# Patient Record
Sex: Male | Born: 1970 | Race: Black or African American | Hispanic: No | Marital: Single | Smoking: Former smoker
Health system: Southern US, Community
[De-identification: ages and names within clinical notes are randomized; demographics above are authoritative.]

## PROBLEM LIST (undated history)

## (undated) DIAGNOSIS — F418 Other specified anxiety disorders: Secondary | ICD-10-CM

## (undated) DIAGNOSIS — C259 Malignant neoplasm of pancreas, unspecified: Secondary | ICD-10-CM

## (undated) DIAGNOSIS — C799 Secondary malignant neoplasm of unspecified site: Secondary | ICD-10-CM

## (undated) DIAGNOSIS — K469 Unspecified abdominal hernia without obstruction or gangrene: Secondary | ICD-10-CM

## (undated) HISTORY — PX: ABDOMINAL SURGERY: SHX537

---

## 2003-02-02 ENCOUNTER — Emergency Department (HOSPITAL_COMMUNITY): Admission: EM | Admit: 2003-02-02 | Discharge: 2003-02-02 | Payer: Self-pay | Admitting: Emergency Medicine

## 2003-02-02 ENCOUNTER — Encounter: Payer: Self-pay | Admitting: Emergency Medicine

## 2003-02-04 ENCOUNTER — Emergency Department (HOSPITAL_COMMUNITY): Admission: EM | Admit: 2003-02-04 | Discharge: 2003-02-04 | Payer: Self-pay | Admitting: Emergency Medicine

## 2003-02-17 ENCOUNTER — Emergency Department (HOSPITAL_COMMUNITY): Admission: EM | Admit: 2003-02-17 | Discharge: 2003-02-17 | Payer: Self-pay | Admitting: *Deleted

## 2003-02-17 ENCOUNTER — Encounter: Payer: Self-pay | Admitting: *Deleted

## 2003-02-26 ENCOUNTER — Emergency Department (HOSPITAL_COMMUNITY): Admission: EM | Admit: 2003-02-26 | Discharge: 2003-02-26 | Payer: Self-pay | Admitting: Emergency Medicine

## 2005-07-19 ENCOUNTER — Emergency Department (HOSPITAL_COMMUNITY): Admission: EM | Admit: 2005-07-19 | Discharge: 2005-07-19 | Payer: Self-pay | Admitting: Emergency Medicine

## 2005-10-20 ENCOUNTER — Inpatient Hospital Stay (HOSPITAL_COMMUNITY): Admission: EM | Admit: 2005-10-20 | Discharge: 2005-10-22 | Payer: Self-pay | Admitting: Emergency Medicine

## 2005-11-07 ENCOUNTER — Emergency Department (HOSPITAL_COMMUNITY): Admission: EM | Admit: 2005-11-07 | Discharge: 2005-11-07 | Payer: Self-pay | Admitting: Emergency Medicine

## 2006-01-13 ENCOUNTER — Emergency Department (HOSPITAL_COMMUNITY): Admission: EM | Admit: 2006-01-13 | Discharge: 2006-01-14 | Payer: Self-pay | Admitting: Emergency Medicine

## 2006-03-17 ENCOUNTER — Emergency Department (HOSPITAL_COMMUNITY): Admission: EM | Admit: 2006-03-17 | Discharge: 2006-03-17 | Payer: Self-pay | Admitting: Emergency Medicine

## 2008-01-07 ENCOUNTER — Emergency Department (HOSPITAL_COMMUNITY): Admission: EM | Admit: 2008-01-07 | Discharge: 2008-01-07 | Payer: Self-pay | Admitting: Family Medicine

## 2010-05-26 ENCOUNTER — Emergency Department (HOSPITAL_COMMUNITY)
Admission: EM | Admit: 2010-05-26 | Discharge: 2010-05-26 | Payer: Self-pay | Source: Home / Self Care | Admitting: Emergency Medicine
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
## 2012-03-06 ENCOUNTER — Emergency Department (HOSPITAL_COMMUNITY): Admission: EM | Admit: 2012-03-06 | Discharge: 2012-03-06 | Discharge status: Against Medical Advice | Payer: Self-pay

## 2012-03-08 ENCOUNTER — Encounter (HOSPITAL_COMMUNITY): Payer: Self-pay | Admitting: *Deleted

## 2012-03-08 ENCOUNTER — Emergency Department (HOSPITAL_COMMUNITY)
Admission: EM | Admit: 2012-03-08 | Discharge: 2012-03-08 | Disposition: A | Discharge status: Home / Self Care | Payer: No Typology Code available for payment source | Attending: Emergency Medicine | Admitting: Emergency Medicine

## 2012-03-08 DIAGNOSIS — Z043 Encounter for examination and observation following other accident: Secondary | ICD-10-CM | POA: Insufficient documentation

## 2012-03-08 DIAGNOSIS — F17200 Nicotine dependence, unspecified, uncomplicated: Secondary | ICD-10-CM | POA: Insufficient documentation

## 2012-03-08 DIAGNOSIS — M542 Cervicalgia: Secondary | ICD-10-CM | POA: Insufficient documentation

## 2012-03-08 MED ORDER — IBUPROFEN 600 MG PO TABS: 600.0000 mg | ORAL_TABLET | Freq: Four times a day (QID) | ORAL | Status: AC | PRN

## 2012-03-08 MED ORDER — CYCLOBENZAPRINE HCL 5 MG PO TABS: 5.0000 mg | ORAL_TABLET | Freq: Three times a day (TID) | ORAL | Status: AC | PRN
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
## 2015-10-28 ENCOUNTER — Encounter (HOSPITAL_COMMUNITY): Payer: Self-pay | Admitting: Nurse Practitioner

## 2015-10-28 ENCOUNTER — Emergency Department (HOSPITAL_COMMUNITY): Payer: Medicaid Other

## 2015-10-28 ENCOUNTER — Inpatient Hospital Stay (HOSPITAL_COMMUNITY)
Admission: EM | Admit: 2015-10-28 | Discharge: 2015-11-07 | DRG: 435 | Disposition: A | Discharge status: Home / Self Care | Payer: Medicaid Other | Attending: Family Medicine | Admitting: Family Medicine

## 2015-10-28 DIAGNOSIS — C787 Secondary malignant neoplasm of liver and intrahepatic bile duct: Secondary | ICD-10-CM | POA: Diagnosis present

## 2015-10-28 DIAGNOSIS — I824Z1 Acute embolism and thrombosis of unspecified deep veins of right distal lower extremity: Secondary | ICD-10-CM | POA: Diagnosis present

## 2015-10-28 DIAGNOSIS — F17210 Nicotine dependence, cigarettes, uncomplicated: Secondary | ICD-10-CM | POA: Diagnosis present

## 2015-10-28 DIAGNOSIS — R030 Elevated blood-pressure reading, without diagnosis of hypertension: Secondary | ICD-10-CM | POA: Diagnosis not present

## 2015-10-28 DIAGNOSIS — D638 Anemia in other chronic diseases classified elsewhere: Secondary | ICD-10-CM | POA: Diagnosis present

## 2015-10-28 DIAGNOSIS — R109 Unspecified abdominal pain: Secondary | ICD-10-CM | POA: Diagnosis present

## 2015-10-28 DIAGNOSIS — I2699 Other pulmonary embolism without acute cor pulmonale: Secondary | ICD-10-CM | POA: Diagnosis present

## 2015-10-28 DIAGNOSIS — I82409 Acute embolism and thrombosis of unspecified deep veins of unspecified lower extremity: Secondary | ICD-10-CM | POA: Diagnosis present

## 2015-10-28 DIAGNOSIS — I824Z2 Acute embolism and thrombosis of unspecified deep veins of left distal lower extremity: Secondary | ICD-10-CM | POA: Diagnosis present

## 2015-10-28 DIAGNOSIS — Z7189 Other specified counseling: Secondary | ICD-10-CM | POA: Insufficient documentation

## 2015-10-28 DIAGNOSIS — F419 Anxiety disorder, unspecified: Secondary | ICD-10-CM | POA: Diagnosis present

## 2015-10-28 DIAGNOSIS — IMO0001 Reserved for inherently not codable concepts without codable children: Secondary | ICD-10-CM

## 2015-10-28 DIAGNOSIS — G893 Neoplasm related pain (acute) (chronic): Secondary | ICD-10-CM | POA: Diagnosis present

## 2015-10-28 DIAGNOSIS — C799 Secondary malignant neoplasm of unspecified site: Secondary | ICD-10-CM

## 2015-10-28 DIAGNOSIS — R634 Abnormal weight loss: Secondary | ICD-10-CM | POA: Diagnosis present

## 2015-10-28 DIAGNOSIS — K5903 Drug induced constipation: Secondary | ICD-10-CM | POA: Insufficient documentation

## 2015-10-28 DIAGNOSIS — C259 Malignant neoplasm of pancreas, unspecified: Principal | ICD-10-CM | POA: Diagnosis present

## 2015-10-28 DIAGNOSIS — M25572 Pain in left ankle and joints of left foot: Secondary | ICD-10-CM | POA: Diagnosis not present

## 2015-10-28 DIAGNOSIS — K5900 Constipation, unspecified: Secondary | ICD-10-CM | POA: Diagnosis not present

## 2015-10-28 DIAGNOSIS — Z6827 Body mass index (BMI) 27.0-27.9, adult: Secondary | ICD-10-CM

## 2015-10-28 DIAGNOSIS — C7900 Secondary malignant neoplasm of unspecified kidney and renal pelvis: Secondary | ICD-10-CM | POA: Diagnosis present

## 2015-10-28 DIAGNOSIS — C7889 Secondary malignant neoplasm of other digestive organs: Secondary | ICD-10-CM | POA: Diagnosis present

## 2015-10-28 DIAGNOSIS — Z515 Encounter for palliative care: Secondary | ICD-10-CM | POA: Insufficient documentation

## 2015-10-28 DIAGNOSIS — I82442 Acute embolism and thrombosis of left tibial vein: Secondary | ICD-10-CM | POA: Diagnosis present

## 2015-10-28 DIAGNOSIS — E46 Unspecified protein-calorie malnutrition: Secondary | ICD-10-CM | POA: Diagnosis present

## 2015-10-28 DIAGNOSIS — T402X5A Adverse effect of other opioids, initial encounter: Secondary | ICD-10-CM

## 2015-10-28 HISTORY — DX: Secondary malignant neoplasm of unspecified site: C79.9

## 2015-10-28 HISTORY — DX: Unspecified abdominal hernia without obstruction or gangrene: K46.9

## 2015-10-28 LAB — URINALYSIS, ROUTINE W REFLEX MICROSCOPIC
GLUCOSE, UA: NEGATIVE mg/dL
KETONES UR: 15 mg/dL — AB
LEUKOCYTES UA: NEGATIVE
Nitrite: NEGATIVE
PROTEIN: 30 mg/dL — AB
Specific Gravity, Urine: 1.027 (ref 1.005–1.030)
pH: 6 (ref 5.0–8.0)

## 2015-10-28 LAB — BASIC METABOLIC PANEL
ANION GAP: 12 (ref 5–15)
BUN: 7 mg/dL (ref 6–20)
CO2: 23 mmol/L (ref 22–32)
Calcium: 9.5 mg/dL (ref 8.9–10.3)
Chloride: 102 mmol/L (ref 101–111)
Creatinine, Ser: 0.96 mg/dL (ref 0.61–1.24)
GFR calc Af Amer: >60 mL/min (ref >60)
GFR calc non Af Amer: >60 mL/min (ref >60)
GLUCOSE: 110 mg/dL — AB (ref 65–99)
POTASSIUM: 3.8 mmol/L (ref 3.5–5.1)
Sodium: 137 mmol/L (ref 135–145)

## 2015-10-28 LAB — CBC
HCT: 35.7 % — ABNORMAL LOW (ref 39.0–52.0)
HEMATOCRIT: 37.7 % — AB (ref 39.0–52.0)
HEMOGLOBIN: 12.4 g/dL — AB (ref 13.0–17.0)
HEMOGLOBIN: 11.8 g/dL — AB (ref 13.0–17.0)
MCH: 27.3 pg (ref 26.0–34.0)
MCH: 27.3 pg (ref 26.0–34.0)
MCHC: 32.9 g/dL (ref 30.0–36.0)
MCHC: 33.1 g/dL (ref 30.0–36.0)
MCV: 83 fL (ref 78.0–100.0)
MCV: 82.4 fL (ref 78.0–100.0)
PLATELETS: 202 10*3/uL (ref 150–400)
Platelets: 189 10*3/uL (ref 150–400)
RBC: 4.54 MIL/uL (ref 4.22–5.81)
RBC: 4.33 MIL/uL (ref 4.22–5.81)
RDW: 13.9 % (ref 11.5–15.5)
RDW: 13.8 % (ref 11.5–15.5)
WBC: 9.4 10*3/uL (ref 4.0–10.5)
WBC: 11 10*3/uL — AB (ref 4.0–10.5)

## 2015-10-28 LAB — COMPREHENSIVE METABOLIC PANEL
ALT: 36 U/L (ref 17–63)
ANION GAP: 13 (ref 5–15)
AST: 33 U/L (ref 15–41)
Albumin: 3.5 g/dL (ref 3.5–5.0)
Alkaline Phosphatase: 146 U/L — ABNORMAL HIGH (ref 38–126)
BUN: 9 mg/dL (ref 6–20)
CALCIUM: 10 mg/dL (ref 8.9–10.3)
CHLORIDE: 102 mmol/L (ref 101–111)
CO2: 22 mmol/L (ref 22–32)
CREATININE: 0.88 mg/dL (ref 0.61–1.24)
Glucose, Bld: 108 mg/dL — ABNORMAL HIGH (ref 65–99)
Potassium: 4 mmol/L (ref 3.5–5.1)
Sodium: 137 mmol/L (ref 135–145)
Total Bilirubin: 0.7 mg/dL (ref 0.3–1.2)
Total Protein: 7.6 g/dL (ref 6.5–8.1)

## 2015-10-28 LAB — URINE MICROSCOPIC-ADD ON

## 2015-10-28 LAB — LIPASE, BLOOD: LIPASE: 26 U/L (ref 11–51)

## 2015-10-28 MED ORDER — PANTOPRAZOLE SODIUM 40 MG IV SOLR
40.0000 mg | Freq: Once | INTRAVENOUS | Status: AC
  Administered 2015-10-28: 40 mg via INTRAVENOUS
  Filled 2015-10-28: qty 40

## 2015-10-28 MED ORDER — IOPAMIDOL (ISOVUE-300) INJECTION 61%
INTRAVENOUS | Status: AC
  Administered 2015-10-28: 100 mL
  Filled 2015-10-28: qty 100

## 2015-10-28 MED ORDER — MORPHINE SULFATE (PF) 4 MG/ML IV SOLN
4.0000 mg | Freq: Once | INTRAVENOUS | Status: AC
  Administered 2015-10-28: 4 mg via INTRAVENOUS
  Filled 2015-10-28: qty 1

## 2015-10-28 MED ORDER — METOCLOPRAMIDE HCL 5 MG/ML IJ SOLN
10.0000 mg | Freq: Once | INTRAMUSCULAR | Status: AC
  Administered 2015-10-28: 10 mg via INTRAVENOUS
  Filled 2015-10-28: qty 2

## 2015-10-28 MED ORDER — ONDANSETRON HCL 4 MG PO TABS: 4.0000 mg | ORAL_TABLET | Freq: Four times a day (QID) | ORAL | Status: DC | PRN

## 2015-10-28 MED ORDER — SODIUM CHLORIDE 0.9 % IV SOLN
INTRAVENOUS | Status: DC
  Administered 2015-10-28: 75 mL/h via INTRAVENOUS
  Administered 2015-10-29 – 2015-11-01 (×5): via INTRAVENOUS
  Administered 2015-11-01: 1000 mL via INTRAVENOUS
  Administered 2015-11-02: 14:00:00 via INTRAVENOUS

## 2015-10-28 MED ORDER — ONDANSETRON HCL 4 MG/2ML IJ SOLN
4.0000 mg | Freq: Four times a day (QID) | INTRAMUSCULAR | Status: DC | PRN
  Administered 2015-10-29: 4 mg via INTRAVENOUS
  Filled 2015-10-28 (×2): qty 2

## 2015-10-28 MED ORDER — ACETAMINOPHEN 325 MG PO TABS
650.0000 mg | ORAL_TABLET | Freq: Four times a day (QID) | ORAL | Status: DC | PRN
  Administered 2015-10-29 – 2015-10-30 (×2): 650 mg via ORAL
  Filled 2015-10-28 (×4): qty 2

## 2015-10-28 MED ORDER — GI COCKTAIL ~~LOC~~
30.0000 mL | Freq: Once | ORAL | Status: AC
  Administered 2015-10-28: 30 mL via ORAL
  Filled 2015-10-28: qty 30

## 2015-10-28 MED ORDER — BISACODYL 5 MG PO TBEC: 5.0000 mg | DELAYED_RELEASE_TABLET | Freq: Every day | ORAL | Status: DC | PRN

## 2015-10-28 MED ORDER — MORPHINE SULFATE (PF) 2 MG/ML IV SOLN
2.0000 mg | INTRAVENOUS | Status: DC | PRN
Start: 2015-10-28 — End: 2015-11-07
  Administered 2015-10-28 – 2015-11-04 (×3): 2 mg via INTRAVENOUS
  Filled 2015-10-28 (×5): qty 1

## 2015-10-28 MED ORDER — POLYETHYLENE GLYCOL 3350 17 G PO PACK
17.0000 g | PACK | Freq: Every day | ORAL | Status: DC | PRN
  Administered 2015-11-02 – 2015-11-03 (×2): 17 g via ORAL
  Filled 2015-10-28 (×2): qty 1

## 2015-10-28 MED ORDER — ACETAMINOPHEN 650 MG RE SUPP: 650.0000 mg | Freq: Four times a day (QID) | RECTAL | Status: DC | PRN

## 2015-10-28 MED ORDER — SODIUM CHLORIDE 0.9 % IV BOLUS (SEPSIS)
1000.0000 mL | Freq: Once | INTRAVENOUS | Status: AC
  Administered 2015-10-28: 1000 mL via INTRAVENOUS

## 2015-10-28 MED ORDER — HYDRALAZINE HCL 25 MG PO TABS: 25.0000 mg | ORAL_TABLET | Freq: Three times a day (TID) | ORAL | Status: DC | PRN

## 2015-10-28 MED ORDER — HYDROCODONE-ACETAMINOPHEN 5-325 MG PO TABS
1.0000 | ORAL_TABLET | ORAL | Status: DC | PRN
  Administered 2015-10-28 – 2015-10-29 (×5): 2 via ORAL
  Filled 2015-10-28 (×5): qty 2

## 2015-10-28 NOTE — ED Notes (Signed)
Pt oob to br with steady gait 

## 2015-10-28 NOTE — ED Notes (Signed)
Attempted to call report

## 2015-10-28 NOTE — ED Notes (Signed)
Return from ct.
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
## 2015-10-28 NOTE — Progress Notes (Signed)
   10/28/15 1506  Clinical Encounter Type  Visited With Patient and family together;Health care provider  Visit Type Initial;ED  Referral From Nurse  Spiritual Encounters  Spiritual Needs Emotional  Stress Factors  Patient Stress Factors Health changes;Major life changes  Family Stress Factors Major life changes;Health changes   Chaplain responded to a request from patient's RN to be with patient and family as they receive poor medical news of cancer. Chaplain was present and offered support. Chaplain services available as needed.   Jeri Lager, Chaplain 10/28/2015 3:07 PM

## 2015-10-28 NOTE — ED Notes (Signed)
Pt c/o 1 month history abd pain. He was diagnosed with a hernia and saw a GI doctor who told him that he does not feel his pain is related to the hernia. He describes the pain as constant. He has tried ibuprofen with minimal relief of pain. He states he can not sleep due to pain. He reports n/v, constipation. He denies fevers.

## 2015-10-28 NOTE — ED Notes (Signed)
Patient transported to CT 
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
## 2015-10-28 NOTE — ED Notes (Signed)
Pastoral care paged for pt and family

## 2015-10-29 ENCOUNTER — Observation Stay (HOSPITAL_COMMUNITY): Payer: Medicaid Other

## 2015-10-29 ENCOUNTER — Encounter (HOSPITAL_COMMUNITY): Payer: Self-pay | Admitting: Radiology

## 2015-10-29 DIAGNOSIS — Z515 Encounter for palliative care: Secondary | ICD-10-CM | POA: Insufficient documentation

## 2015-10-29 DIAGNOSIS — C259 Malignant neoplasm of pancreas, unspecified: Secondary | ICD-10-CM | POA: Diagnosis present

## 2015-10-29 DIAGNOSIS — Z7189 Other specified counseling: Secondary | ICD-10-CM | POA: Insufficient documentation

## 2015-10-29 DIAGNOSIS — C787 Secondary malignant neoplasm of liver and intrahepatic bile duct: Secondary | ICD-10-CM

## 2015-10-29 DIAGNOSIS — R1084 Generalized abdominal pain: Secondary | ICD-10-CM

## 2015-10-29 LAB — CBC
HCT: 34.8 % — ABNORMAL LOW (ref 39.0–52.0)
HEMOGLOBIN: 11.4 g/dL — AB (ref 13.0–17.0)
MCH: 27.4 pg (ref 26.0–34.0)
MCHC: 32.8 g/dL (ref 30.0–36.0)
MCV: 83.7 fL (ref 78.0–100.0)
Platelets: 182 10*3/uL (ref 150–400)
RBC: 4.16 MIL/uL — AB (ref 4.22–5.81)
RDW: 14.1 % (ref 11.5–15.5)
WBC: 10.9 10*3/uL — ABNORMAL HIGH (ref 4.0–10.5)

## 2015-10-29 LAB — BASIC METABOLIC PANEL
ANION GAP: 11 (ref 5–15)
BUN: 8 mg/dL (ref 6–20)
CALCIUM: 9.3 mg/dL (ref 8.9–10.3)
CO2: 23 mmol/L (ref 22–32)
Chloride: 105 mmol/L (ref 101–111)
Creatinine, Ser: 0.99 mg/dL (ref 0.61–1.24)
GFR calc non Af Amer: >60 mL/min (ref >60)
Glucose, Bld: 111 mg/dL — ABNORMAL HIGH (ref 65–99)
Potassium: 4.3 mmol/L (ref 3.5–5.1)
Sodium: 139 mmol/L (ref 135–145)

## 2015-10-29 LAB — APTT: aPTT: 38 seconds — ABNORMAL HIGH (ref 24–37)

## 2015-10-29 LAB — PROTIME-INR
INR: 1.65 — ABNORMAL HIGH (ref 0.00–1.49)
PROTHROMBIN TIME: 19.5 s — AB (ref 11.6–15.2)

## 2015-10-29 MED ORDER — ENSURE ENLIVE PO LIQD
237.0000 mL | Freq: Three times a day (TID) | ORAL | Status: DC
  Administered 2015-10-30: 237 mL via ORAL

## 2015-10-29 MED ORDER — ENOXAPARIN SODIUM 100 MG/ML ~~LOC~~ SOLN
1.0000 mg/kg | Freq: Once | SUBCUTANEOUS | Status: AC
  Administered 2015-10-29: 90 mg via SUBCUTANEOUS
  Filled 2015-10-29: qty 1

## 2015-10-29 MED ORDER — IOPAMIDOL (ISOVUE-300) INJECTION 61%
INTRAVENOUS | Status: AC
  Administered 2015-10-29: 75 mL
  Filled 2015-10-29: qty 75

## 2015-10-29 MED ORDER — OXYCODONE HCL 5 MG PO TABS
5.0000 mg | ORAL_TABLET | ORAL | Status: DC | PRN
  Administered 2015-10-29 – 2015-10-30 (×5): 5 mg via ORAL
  Filled 2015-10-29 (×6): qty 1

## 2015-10-29 NOTE — Progress Notes (Addendum)
PROGRESS NOTE  Nathan [name]  [id number] DOB: [birth year]  DOA: 10/28/2015 PCP: No primary care provider on file.  Outpatient Specialists:  None  Brief Narrative:  45 year old male with no significant past medical history, presented to ED with 1 month history of upper abdominal pain radiating to back, 30 pound weight loss over the last couple of months, decreased appetite. CT abdomen and pelvis suggested pancreatic cancer with metastasis to liver, spleen and both kidneys. Interventional radiology consulted and plan ultrasound-guided biopsy on 5/3. Palliative care consulted for goals of care.   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)   Encounter for palliative care   Goals of care, counseling/discussion   Metastatic pancreatic cancer - IR consulted and plan Liver biopsy on 10/30/15. - Pain is controlled. - Oncology consultation pending pathology results-May be done as outpatient.  Anemia - Stable. Follow CBCs periodically.   DVT prophylaxis: SCDs Code Status: Full Family Communication: Discussed with patient's sister and girlfriend at bedside. Disposition Plan: DC home when medically stable.   Consultants:   Interventional radiology  Palliative care medicine  Procedures:   None  Antimicrobials:   None    Subjective: Abdominal pain is controlled.  Objective:  Filed Vitals:   10/28/15 2012 10/28/15 2015 10/29/15 0621 10/29/15 1429  BP:  155/79 136/80 143/93  Pulse:  85 78 96  Temp:  99.3 F (37.4 C) 98.4 F (36.9 C) 98.9 F (37.2 C)  TempSrc:  Oral Oral Oral  Resp:  16 18 18   Height: 5' 10.8" (1.798 m)     Weight: 88.315 kg (194 lb 11.2 oz)     SpO2:  100% 97% 99%   No intake or output data in the 24 hours ending 10/29/15 1805 Filed Weights   10/28/15 2012  Weight: 88.315 kg (194 lb 11.2 oz)      Examination:  General exam: Pleasant young male sitting up comfortably in bed. Appears calm and comfortable  Respiratory system: Clear to auscultation. Respiratory effort normal. Cardiovascular system: S1 & S2 heard, RRR.Marland Kitchen No JVD, murmurs, rubs, gallops or clicks. No pedal edema. Gastrointestinal system: Abdomen is nondistended, soft and mild epigastric region tenderness without peritoneal signs . No organomegaly or masses felt. Normal bowel sounds heard. Central nervous system: Alert and oriented. No focal neurological deficits. Extremities: Symmetric 5 x 5 power. Skin: No rashes, lesions or ulcers Psychiatry: Judgement and insight appear normal. Mood & affect appropriate.     Data Reviewed: I have personally reviewed following labs and imaging studies  CBC:  Recent Labs Lab 10/28/15 1138 10/28/15 1856 10/29/15 0439  WBC 9.4 11.0* 10.9*  HGB 12.4* 11.8* 11.4*  HCT 37.7* 35.7* 34.8*  MCV 83.0 82.4 83.7  PLT 202 189 [id number]   Basic Metabolic Panel:  Recent Labs Lab 10/28/15 1138 10/28/15 1856 10/29/15 0439  NA 137 137 139  K 4.0 3.8 4.3  CL 102 102 105  CO2 22 23 23   GLUCOSE 108* 110* 111*  BUN 9 7 8   CREATININE 0.88 0.96 0.99  CALCIUM 10.0 9.5 9.3   GFR: Estimated Creatinine Clearance: 101.8 mL/min (by C-G formula based on Cr of 0.99). Liver Function Tests:  Recent Labs Lab 10/28/15 1138  AST 33  ALT 36  ALKPHOS 146*  BILITOT 0.7  PROT 7.6  ALBUMIN 3.5  Recent Labs Lab 10/28/15 1138  LIPASE 26   No results for input(s): AMMONIA in the last 168 hours. Coagulation Profile:  Recent Labs Lab 10/29/15 1105  INR 1.65*   Cardiac Enzymes: No results for input(s): CKTOTAL, CKMB, CKMBINDEX, TROPONINI in the last 168 hours. BNP (last 3 results) No results for input(s): PROBNP in the last 8760 hours. HbA1C: No results for input(s): HGBA1C in the last 72 hours. CBG: No results for input(s): GLUCAP in the last 168 hours. Lipid Profile: No results  for input(s): CHOL, HDL, LDLCALC, TRIG, CHOLHDL, LDLDIRECT in the last 72 hours. Thyroid Function Tests: No results for input(s): TSH, T4TOTAL, FREET4, T3FREE, THYROIDAB in the last 72 hours. Anemia Panel: No results for input(s): VITAMINB12, FOLATE, FERRITIN, TIBC, IRON, RETICCTPCT in the last 72 hours. Urine analysis:    Component Value Date/Time   COLORURINE AMBER* 10/28/2015 [place] 10/28/2015 1313   LABSPEC 1.027 10/28/2015 1313   PHURINE 6.0 10/28/2015 1313   GLUCOSEU NEGATIVE 10/28/2015 1313   HGBUR MODERATE* 10/28/2015 1313   BILIRUBINUR SMALL* 10/28/2015 1313   KETONESUR 15* 10/28/2015 1313   PROTEINUR 30* 10/28/2015 1313   NITRITE NEGATIVE 10/28/2015 1313   LEUKOCYTESUR NEGATIVE 10/28/2015 1313   Sepsis Labs: @LABRCNTIP (procalcitonin:4,lacticidven:4)  )No results found for this or any previous visit (from the past 240 hour(s)).       Radiology Studies: Ct Abdomen Pelvis W Contrast  10/28/2015  CLINICAL DATA:  Epigastric pain for 1 month. EXAM: CT ABDOMEN AND PELVIS WITH CONTRAST TECHNIQUE: Multidetector CT imaging of the abdomen and pelvis was performed using the standard protocol following bolus administration of intravenous contrast. CONTRAST:  100 cc ISOVUE-300 IOPAMIDOL (ISOVUE-300) INJECTION 61% COMPARISON:  None. FINDINGS: Lower chest:  Normal. Hepatobiliary: Innumerable metastatic lesions throughout the liver. The dominant lesions are a 4.4 cm lesion in the dome of the right lobe and a 5.5 cm lesion in the lateral aspect. The lesions involve all segments of the liver. No biliary ductal dilatation. Pancreas: There is a inhomogeneous 6.1 x 4.0 x 5.4 cm tumor in the tail of the pancreas invading the posterior wall of the stomach. The head and body of the pancreas are normal. Spleen: Ill-defined 19 mm lesion in the anterior aspect of the spleen. There are 2 smaller lesions in the spleen. These are worrisome for metastatic disease. Adrenals/Urinary Tract:  There are several vague areas of low-density in each kidney, worrisome for metastatic disease. Benign appearing 25 mm cyst on the lower pole of the left kidney. No hydronephrosis. The bladder is normal. Stomach/Bowel: The bowel is normal including the terminal ileum and appendix except for a tiny fecalith in the appendix. There is a small midline anterior abdominal wall hernia through the linea alba containing omentum including some omental vessels. This hernia as above the umbilicus. Vascular/Lymphatic: Normal. Reproductive: Normal. Other: No free air or free fluid. Musculoskeletal: Normal. IMPRESSION: Metastatic pancreatic carcinoma. There are metastases to the liver, spleen and both kidneys. Anterior abdominal wall hernia containing omentum. Electronically Signed   By: Lorriane Shire M.D.   On: 10/28/2015 14:15        Scheduled Meds: . feeding supplement (ENSURE ENLIVE)  237 mL Oral TID BM   Continuous Infusions: . sodium chloride 75 mL/hr at 10/29/15 0800        Time spent: 20 minutes.    [facility], MD [facility] Pager [phone]  If 7PM-7AM, please contact night-coverage [email or web address] Password [facility] 10/29/2015, 6:05 PM

## 2015-10-29 NOTE — Progress Notes (Signed)
[place] for Lovenox Indication: pulmonary embolus in setting of metastatic pancreatic cancer   No Known Allergies  Patient Measurements: Height: 5' 10.8" (179.8 cm) Weight: 194 lb 11.2 oz (88.315 kg) IBW/kg (Calculated) : 74.84  Vital Signs: Temp: 98.9 F (37.2 C) (05/02 1429) Temp Source: Oral (05/02 1429) BP: 143/93 mmHg (05/02 1429) Pulse Rate: 96 (05/02 1429)  Labs:  Recent Labs  10/28/15 1138 10/28/15 1856 10/29/15 0439 10/29/15 1105  HGB 12.4* 11.8* 11.4*  --   HCT 37.7* 35.7* 34.8*  --   PLT 202 189 182  --   APTT  --   --   --  38*  LABPROT  --   --   --  19.5*  INR  --   --   --  1.65*  CREATININE 0.88 0.96 0.99  --     Medical History: Past Medical History  Diagnosis Date  . Abdominal hernia   . Metastatic cancer ([place]) 10/28/2015    Assessment: 11 yoM with CT suspicious for PE in setting of newly dx metastatic pancreatic cancer. Pharmacy asked to give a one time dose of Lovenox tonight until long term anticoagulation plans can be determined in am. SCr 0.99,  INR 1.65 on no known anticoagulation PTA and stable CBC.   Goal of Therapy:  Monitor platelets by anticoagulation protocol: Yes   Plan:  1. Lovenox 90 mg x 1 tonight per MD request 2. F/u long term anticoagulation plans in am     Vincenza Hews, PharmD, BCPS 10/29/2015, 8:32 PM Pager: [phone]

## 2015-10-29 NOTE — Progress Notes (Signed)
Initial Nutrition Assessment  DOCUMENTATION CODES:   Not applicable  INTERVENTION:   -Ensure Enlive po TID, each supplement provides 350 kcal and 20 grams of protein  NUTRITION DIAGNOSIS:   Inadequate oral intake related to poor appetite as evidenced by per patient/family report.  GOAL:   Patient will meet greater than or equal to 90% of their needs  MONITOR:   PO intake, Supplement acceptance, Labs, Weight trends, Skin, I & O's  REASON FOR ASSESSMENT:   Malnutrition Screening Tool    ASSESSMENT:   SHAFT TIA is a 45 y.o. male with a Past Medical History of abd hernia who presents with likely metastatic pancreatic cancer. Pt aware of likely dx and poor prognosis. Very unfortunate condition for pt. Aggressive Dx workup and pain treatment at this time.   Pt admitted with abdominal pain. Noted pt with likely dx of metastatic pancreatic cancer.   Spoke with pt and pt family members (girlfriend and sister) at bedside. All confirm pt with poor appetite over the past 2-3 months. Pt reports that he "just hasn't been able to eat much" and is without the desire to eat.   Chart review indicates pt has experienced a 30# wt loss, however, no documented wt hx available to confirm weight changes.  Per MD notes, CT scan of the abdomen and pelvis suggest widespread metastatic cancer with a pancreatic lesion invading the posterior wall of the stomach.   Pt with flat affect. Unable to complete Nutrition-Focused physical exam at this time. Interview was prematurely terminated, due to MD walking into room due to pt visit.   Case discussed with RN. She reports pt and family have been very emotional due to recent diagnosis. She voices plan for palliative care consult (for pain control) and plans for liver biopsy.   Pt with increased nutritional needs due to probable cancer diagnosis. RD will add supplements in light of prolonged PO intake. RD suspects some element of malnutrition, however,  unable to identify at this time.   Labs reviewed.   Diet Order:  Diet regular Room service appropriate?: Yes; Fluid consistency:: Thin  Skin:  Reviewed, no issues  Last BM:  10/29/15  Height:   Ht Readings from Last 1 Encounters:  10/28/15 5' 10.8" (1.798 m)    Weight:   Wt Readings from Last 1 Encounters:  10/28/15 194 lb 11.2 oz (88.315 kg)    Ideal Body Weight:  75.5 kg  BMI:  Body mass index is 27.32 kg/(m^2).  Estimated Nutritional Needs:   Kcal:  2000-2200  Protein:  105-120 grams  Fluid:  2.0-2.2 L  EDUCATION NEEDS:   No education needs identified at this time  Angeleah Labrake A. Jimmye Norman, RD, LDN, CDE Pager: [phone] After hours Pager: [phone]

## 2015-10-29 NOTE — Plan of Care (Addendum)
NP spoke to Dr. Algis Liming, attending, at shift change about CT results. Per Dr. Golden Circle, radiology, CT is suspicious for PE. Pt is slated for a biopsy by IR, possibly, in the am. Because of the upcoming procedure, we will give the pt one treatment dose of Lovenox tonight. Check renal function in am and if stable, get a PE protocol CTA chest to confirm  ? PE. In am, further plan will be initiated regarding anticoagulation and coordination with procedure.  KJKG, NP [facility] went to bedside to discuss plan with pt. All questions asked and answered. KJKG, NP [facility]
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
## 2015-10-29 NOTE — Progress Notes (Addendum)
Pt with chest ct findings suggestive of questionable pulmonary embolism when compared to previous test. Provider [facility] notified. Awaiting further orders. Will continue to monitor pt.
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
## 2015-10-30 ENCOUNTER — Observation Stay (HOSPITAL_COMMUNITY): Payer: Medicaid Other

## 2015-10-30 DIAGNOSIS — I2699 Other pulmonary embolism without acute cor pulmonale: Secondary | ICD-10-CM

## 2015-10-30 DIAGNOSIS — I824Z2 Acute embolism and thrombosis of unspecified deep veins of left distal lower extremity: Secondary | ICD-10-CM | POA: Diagnosis present

## 2015-10-30 DIAGNOSIS — I824Y3 Acute embolism and thrombosis of unspecified deep veins of proximal lower extremity, bilateral: Secondary | ICD-10-CM

## 2015-10-30 DIAGNOSIS — I82442 Acute embolism and thrombosis of left tibial vein: Secondary | ICD-10-CM | POA: Diagnosis present

## 2015-10-30 DIAGNOSIS — C7889 Secondary malignant neoplasm of other digestive organs: Secondary | ICD-10-CM | POA: Diagnosis present

## 2015-10-30 DIAGNOSIS — G893 Neoplasm related pain (acute) (chronic): Secondary | ICD-10-CM | POA: Diagnosis present

## 2015-10-30 DIAGNOSIS — I824Z1 Acute embolism and thrombosis of unspecified deep veins of right distal lower extremity: Secondary | ICD-10-CM | POA: Diagnosis present

## 2015-10-30 DIAGNOSIS — M25572 Pain in left ankle and joints of left foot: Secondary | ICD-10-CM | POA: Diagnosis not present

## 2015-10-30 DIAGNOSIS — F17210 Nicotine dependence, cigarettes, uncomplicated: Secondary | ICD-10-CM | POA: Diagnosis present

## 2015-10-30 DIAGNOSIS — C7900 Secondary malignant neoplasm of unspecified kidney and renal pelvis: Secondary | ICD-10-CM | POA: Diagnosis present

## 2015-10-30 DIAGNOSIS — R030 Elevated blood-pressure reading, without diagnosis of hypertension: Secondary | ICD-10-CM | POA: Diagnosis not present

## 2015-10-30 DIAGNOSIS — C787 Secondary malignant neoplasm of liver and intrahepatic bile duct: Secondary | ICD-10-CM | POA: Diagnosis present

## 2015-10-30 DIAGNOSIS — R1013 Epigastric pain: Secondary | ICD-10-CM | POA: Diagnosis present

## 2015-10-30 DIAGNOSIS — C259 Malignant neoplasm of pancreas, unspecified: Secondary | ICD-10-CM | POA: Diagnosis present

## 2015-10-30 DIAGNOSIS — D638 Anemia in other chronic diseases classified elsewhere: Secondary | ICD-10-CM | POA: Diagnosis present

## 2015-10-30 DIAGNOSIS — Z6827 Body mass index (BMI) 27.0-27.9, adult: Secondary | ICD-10-CM | POA: Diagnosis not present

## 2015-10-30 DIAGNOSIS — K5900 Constipation, unspecified: Secondary | ICD-10-CM | POA: Diagnosis not present

## 2015-10-30 DIAGNOSIS — E46 Unspecified protein-calorie malnutrition: Secondary | ICD-10-CM | POA: Diagnosis present

## 2015-10-30 DIAGNOSIS — F419 Anxiety disorder, unspecified: Secondary | ICD-10-CM | POA: Diagnosis present

## 2015-10-30 LAB — BASIC METABOLIC PANEL
ANION GAP: 10 (ref 5–15)
BUN: <5 mg/dL — ABNORMAL LOW (ref 6–20)
CO2: 25 mmol/L (ref 22–32)
Calcium: 9 mg/dL (ref 8.9–10.3)
Chloride: 101 mmol/L (ref 101–111)
Creatinine, Ser: 0.95 mg/dL (ref 0.61–1.24)
GFR calc Af Amer: >60 mL/min (ref >60)
GLUCOSE: 109 mg/dL — AB (ref 65–99)
Potassium: 4 mmol/L (ref 3.5–5.1)
Sodium: 136 mmol/L (ref 135–145)

## 2015-10-30 LAB — PROTIME-INR
INR: 1.4 (ref 0.00–1.49)
Prothrombin Time: 17.3 seconds — ABNORMAL HIGH (ref 11.6–15.2)

## 2015-10-30 LAB — HEPARIN LEVEL (UNFRACTIONATED): Heparin Unfractionated: 0.14 IU/mL — ABNORMAL LOW (ref 0.30–0.70)

## 2015-10-30 MED ORDER — IOPAMIDOL (ISOVUE-370) INJECTION 76%
INTRAVENOUS | Status: AC
  Administered 2015-10-30: 100 mL via INTRAVENOUS
  Filled 2015-10-30: qty 100

## 2015-10-30 MED ORDER — HEPARIN (PORCINE) IN NACL 100-0.45 UNIT/ML-% IJ SOLN
1950.0000 [IU]/h | INTRAMUSCULAR | Status: DC
  Administered 2015-10-30: 1950 [IU]/h via INTRAVENOUS
  Administered 2015-10-30: 1600 [IU]/h via INTRAVENOUS
  Filled 2015-10-30 (×2): qty 250

## 2015-10-30 MED ORDER — OXYCODONE HCL 5 MG PO TABS
7.5000 mg | ORAL_TABLET | ORAL | Status: DC | PRN
  Administered 2015-10-30 – 2015-11-05 (×26): 7.5 mg via ORAL
  Filled 2015-10-30 (×30): qty 2

## 2015-10-30 NOTE — Progress Notes (Signed)
*  PRELIMINARY RESULTS* Vascular Ultrasound Lower extremity venous duplex has been completed.  Preliminary findings: DVT noted in the Right peroneal veins, Left posterior tibial veins, and Left peroneal veins. Superficial thrombosis noted in the left small saphenous vein.   Called results to [facility], RN    Landry Mellow, RDMS, RVT  10/30/2015, 3:25 PM

## 2015-10-30 NOTE — Consult Note (Signed)
Lindley Hiney M. Rocky Gladden, EdD 

## 2015-10-30 NOTE — Progress Notes (Signed)
[place] for Heparin Indication: pulmonary embolus in setting of metastatic pancreatic cancer   No Known Allergies  Patient Measurements: Height: 5' 10.8" (179.8 cm) Weight: 194 lb 11.2 oz (88.315 kg) IBW/kg (Calculated) : 74.84  HDW 88.3 kg  Vital Signs: Temp: 99.9 F (37.7 C) (05/03 1431) Temp Source: Oral (05/03 1431) BP: 144/91 mmHg (05/03 1431) Pulse Rate: 90 (05/03 1431)  Labs:  Recent Labs  10/28/15 1138 10/28/15 1856 10/29/15 0439 10/29/15 1105 10/30/15 0527 10/30/15 1639  HGB 12.4* 11.8* 11.4*  --   --   --   HCT 37.7* 35.7* 34.8*  --   --   --   PLT 202 189 182  --   --   --   APTT  --   --   --  38*  --   --   LABPROT  --   --   --  19.5* 17.3*  --   INR  --   --   --  1.65* 1.40  --   HEPARINUNFRC  --   --   --   --   --  0.14*  CREATININE 0.88 0.96 0.99  --  0.95  --     Medical History: Past Medical History  Diagnosis Date  . Abdominal hernia   . Metastatic cancer ([place]) 10/28/2015    Assessment: 85 yoM with CT suspicious for PE in setting of newly dx metastatic pancreatic cancer. Given 1x dose of Lovenox last night at ~2100. CTA this AM confirms bilateral PE, no evidence of RHS. Pharmacy consulted to dose heparin due to also being scheduled for liver biopsy with IR on 5/4. INR 1.65 on no known anticoagulation PTA, stable CBC, no bleed documented.  Initial heparin level 0.14 on 1600 units/hr.  + bilateral LE dopplers. No bleeding or issues with line reported. INR 1.40.   Goal of Therapy:  Monitor platelets by anticoagulation protocol: Yes   Plan:  Increase heparin to 1950 units/hr  Heparin level in 6 hours Daily HL/CBC Monitor s/sx bleeding    Sloan Leiter, PharmD, BCPS Clinical Pharmacist [phone]  10/30/2015 6:16 PM

## 2015-10-30 NOTE — Progress Notes (Signed)
Nathan [name] of [place] for Heparin Indication: pulmonary embolus in setting of metastatic pancreatic cancer   No Known Allergies  Patient Measurements: Height: 5' 10.8" (179.8 cm) Weight: 194 lb 11.2 oz (88.315 kg) IBW/kg (Calculated) : 74.84  Vital Signs: Temp: 98.3 F (36.8 C) (05/03 0553) Temp Source: Oral (05/03 0553) BP: 136/96 mmHg (05/03 0553) Pulse Rate: 82 (05/03 0553)  Labs:  Recent Labs  10/28/15 1138 10/28/15 1856 10/29/15 0439 10/29/15 1105 10/30/15 0527  HGB 12.4* 11.8* 11.4*  --   --   HCT 37.7* 35.7* 34.8*  --   --   PLT 202 189 182  --   --   APTT  --   --   --  38*  --   LABPROT  --   --   --  19.5* 17.3*  INR  --   --   --  1.65* 1.40  CREATININE 0.88 0.96 0.99  --  0.95    Medical History: Past Medical History  Diagnosis Date  . Abdominal hernia   . Metastatic cancer ([place]) 10/28/2015    Assessment: 42 yoM with CT suspicious for PE in setting of newly dx metastatic pancreatic cancer. Given 1x dose of Lovenox last night at ~2100. CTA this AM confirms bilateral PE, no evidence of RHS. Pharmacy consulted to dose heparin due to also being scheduled for liver biopsy with IR on 5/4. INR 1.65 on no known anticoagulation PTA, stable CBC, no bleed documented.  Goal of Therapy:  Monitor platelets by anticoagulation protocol: Yes   Plan:  Start heparin 1600 at units/h (no bolus) 6h  HL Daily HL/CBC Monitor s/sx bleeding    Elicia Lamp, PharmD, [facility] Clinical Pharmacist Pager [phone] 10/30/2015 9:36 AM

## 2015-10-30 NOTE — Progress Notes (Signed)
PROGRESS NOTE  Nathan [name]  [id number] DOB: Jan 19, 1971  DOA: 10/28/2015 PCP: No primary care provider on file.  Outpatient Specialists:  None  Brief Narrative:  45 year old male with no significant past medical history, presented to ED with 1 month history of upper abdominal pain radiating to back, 30 pound weight loss over the last couple of months, decreased appetite. CT abdomen and pelvis suggested pancreatic cancer with metastasis to liver, spleen and both kidneys. Interventional radiology consulted and plan ultrasound-guided biopsy on 5/4. Palliative care consulted for goals of care. CTA chest confirmed bilateral PE. Lower extremity venous Dopplers confirmed bilateral DVT. Started on IV heparin per pharmacy. Oncology consulted and will see 5/4.   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 - Oncology consulted and will see patient on 5/4 and recommend placement of Port-A-Cath while hospitalized so early chemotherapy can be started as outpatient without delay. - Abdominal pain better controlled than admission but only for 2 hours. Adjust pain medications.  Acute bilateral pulmonary embolism and bilateral lower extremity DVT's - CT chest with contrast 5/2 was read by radiologist as suspicious for pulmonary embolism. I called and discussed in detail with the reading radiologist who reiterated his strong suspicion for pulmonary embolism and recommended a dose of full dose Lovenox, follow-up CTA chest and then proceed as needed. Patient received a full dose of Lovenox on 5/2 PM - CTA  chest 5/3 confirmed bilateral acute PE without right heart strain. Bilateral lower extremity venous Doppler confirms DVT in bilateral lower extremities. - Started IV heparin per pharmacy. Await oncology opinion in a.m. regarding long-term anticoagulation. Lovenox may be expensive and might have to go on Coumadin versus NOAC's  Anemia - Stable. Follow CBCs periodically.  Left ankle and foot pain - No history of trauma. X-rays without acute findings.? Related to DVT versus other musculoskeletal skeletal etiology. Pain management. K pad.   DVT prophylaxis: IV heparin drip. Code Status: Full Family Communication: Discussed with patient's mother and girlfriend at bedside. Disposition Plan: DC home when medically stable.   Consultants:   Interventional radiology  Palliative care medicine  Oncology  Procedures:   Bilateral lower extremity venous Dopplers 10/30/15: Vascular Ultrasound Lower extremity venous duplex has been completed. Preliminary findings: DVT noted in the Right peroneal veins, Left posterior tibial veins, and Left peroneal veins. Superficial thrombosis noted in the left small saphenous vein.  Antimicrobials:   None    Subjective: Abdominal pain control for 2 hours after oral pain medications and then returns. Worsened left ankle/plantar foot pain, especially on weightbearing.  Objective:  Filed Vitals:   10/29/15 2300 10/29/15 2348 10/30/15 0553 10/30/15 1431  BP:   136/96 144/91  Pulse: 101  82 90  Temp: 100.6 F (38.1 C) 98.4 F (36.9 C) 98.3 F (36.8 C) 99.9 F (37.7 C)  TempSrc:  Oral Oral Oral  Resp:   17 19  Height:      Weight:      SpO2: 99%  98% 97%    Intake/Output Summary (Last 24 hours) at 10/30/15 1617 Last  data filed at 2015/11/02 1534  Gross per 24 hour  Intake 559.17 ml  Output      4 ml  Net 555.17 ml   Filed Weights   10/28/15 2012  Weight: 88.315 kg (194 lb 11.2 oz)    Examination:  General exam: Pleasant young male  sitting up comfortably in bed. Appears calm and comfortable  Respiratory system: Clear to auscultation. Respiratory effort normal. Cardiovascular system: S1 & S2 heard, RRR.Marland Kitchen No JVD, murmurs, rubs, gallops or clicks. No pedal edema. Gastrointestinal system: Abdomen is nondistended, soft and mild epigastric region tenderness without peritoneal signs . No organomegaly or masses felt. Normal bowel sounds heard. Central nervous system: Alert and oriented. No focal neurological deficits. Extremities: Symmetric 5 x 5 power.Left ankle, mildly swollen, increased warmth but no erythema or skin lesions. Mild tenderness. Good peripheral pulses felt. Skin: No rashes, lesions or ulcers Psychiatry: Judgement and insight appear normal. Mood & affect appropriate.     Data Reviewed: I have personally reviewed following labs and imaging studies  CBC:  Recent Labs Lab 10/28/15 1138 10/28/15 1856 10/29/15 0439  WBC 9.4 11.0* 10.9*  HGB 12.4* 11.8* 11.4*  HCT 37.7* 35.7* 34.8*  MCV 83.0 82.4 83.7  PLT 202 189 [id number]   Basic Metabolic Panel:  Recent Labs Lab 10/28/15 1138 10/28/15 1856 10/29/15 0439 11/02/2015 0527  NA 137 137 139 136  K 4.0 3.8 4.3 4.0  CL 102 102 105 101  CO2 22 23 23 25   GLUCOSE 108* 110* 111* 109*  BUN 9 7 8  <5*  CREATININE 0.88 0.96 0.99 0.95  CALCIUM 10.0 9.5 9.3 9.0   GFR: Estimated Creatinine Clearance: 106.1 mL/min (by C-G formula based on Cr of 0.95). Liver Function Tests:  Recent Labs Lab 10/28/15 1138  AST 33  ALT 36  ALKPHOS 146*  BILITOT 0.7  PROT 7.6  ALBUMIN 3.5    Recent Labs Lab 10/28/15 1138  LIPASE 26   No results for input(s): AMMONIA in the last 168 hours. Coagulation Profile:  Recent Labs Lab 10/29/15 1105 02-Nov-2015 0527  INR 1.65* 1.40   Cardiac Enzymes: No results for input(s): CKTOTAL, CKMB, CKMBINDEX, TROPONINI in the last 168 hours. BNP (last 3 results) No results for input(s): PROBNP in the last 8760 hours. HbA1C: No  results for input(s): HGBA1C in the last 72 hours. CBG: No results for input(s): GLUCAP in the last 168 hours. Lipid Profile: No results for input(s): CHOL, HDL, LDLCALC, TRIG, CHOLHDL, LDLDIRECT in the last 72 hours. Thyroid Function Tests: No results for input(s): TSH, T4TOTAL, FREET4, T3FREE, THYROIDAB in the last 72 hours. Anemia Panel: No results for input(s): VITAMINB12, FOLATE, FERRITIN, TIBC, IRON, RETICCTPCT in the last 72 hours. Urine analysis:    Component Value Date/Time   COLORURINE AMBER* 10/28/2015 [place] 10/28/2015 1313   LABSPEC 1.027 10/28/2015 1313   PHURINE 6.0 10/28/2015 1313   GLUCOSEU NEGATIVE 10/28/2015 1313   HGBUR MODERATE* 10/28/2015 1313   BILIRUBINUR SMALL* 10/28/2015 1313   KETONESUR 15* 10/28/2015 1313   PROTEINUR 30* 10/28/2015 1313   NITRITE NEGATIVE 10/28/2015 1313   LEUKOCYTESUR NEGATIVE 10/28/2015 1313   Sepsis Labs: @LABRCNTIP (procalcitonin:4,lacticidven:4)  )No results found for this or any previous visit (from the past 240 hour(s)).       Radiology Studies: Dg Ankle Complete Left  Nov 02, 2015  CLINICAL DATA:  Left ankle pain for 3 days.  No known injury. EXAM: LEFT ANKLE COMPLETE - 3+ VIEW COMPARISON:  None. FINDINGS: There is no  evidence of fracture, dislocation, or joint effusion. Minimal degenerative spurring is seen along the inferior aspect of the medial malleolus in the anterior margin of the distal tibia. No evidence of joint space narrowing. Soft tissues are unremarkable. IMPRESSION: No acute findings.  Mild degenerative spurring. Electronically Signed   By: Earle Gell M.D.   On: 10/30/2015 12:20   Ct Chest W Contrast  10/29/2015  CLINICAL DATA:  Chest pain for 1 day EXAM: CT CHEST WITH CONTRAST TECHNIQUE: Multidetector CT imaging of the chest was performed during intravenous contrast administration. CONTRAST:  3mL ISOVUE-300 IOPAMIDOL (ISOVUE-300) INJECTION 61% COMPARISON:  None FINDINGS: The lungs are well  aerated bilaterally. No focal infiltrate or sizable effusion is seen. No focal parenchymal nodule is noted. No sizable effusion or pneumothorax is noted. The thoracic inlet is within normal limits. The thoracic aorta and its branches are unremarkable. Pulmonary artery is incompletely evaluated due to timing of the contrast bolus. No significant hilar or mediastinal adenopathy is noted. Scanning into the upper abdomen demonstrates multiple hypodense lesions throughout the liver consistent with the patient's given clinical history of metastatic pancreas carcinoma to the liver. These are stable from a CT from the previous day. A few small hypodensities are also noted within the spleen also suggestive of metastatic disease. Persistent mass in the tail of the pancreas is noted. These changes were better evaluated on the recent CT examination of the abdomen and pelvis. No acute bony abnormality is seen. IMPRESSION: Changes consistent with pancreatic mass and metastatic disease as seen on the previous CT examination. The timing of the contrast bolus limits evaluation for potential pulmonary embolus. When compared with the exam from the previous day, however there are findings suggestive of pulmonary embolism. No other intrathoracic abnormality is noted. These results were called by telephone at the time of interpretation on 10/29/2015 at 6:21 pm to [facility] the pts nurse, who verbally acknowledged these results. Electronically Signed   By: Inez Catalina M.D.   On: 10/29/2015 18:21   Ct Angio Chest Pe W/cm &/or Wo Cm  10/30/2015  CLINICAL DATA:  45 year old male with new imaging diagnosis of metastatic pancreatic carcinoma, 10/28/2015. Chest CT 10/29/2015 suggests pulmonary embolism. EXAM: CT ANGIOGRAPHY CHEST WITH CONTRAST TECHNIQUE: Multidetector CT imaging of the chest was performed using the standard protocol during bolus administration of intravenous contrast. Multiplanar CT image reconstructions and MIPs were obtained to  evaluate the vascular anatomy. CONTRAST:  100 cc Isovue 370 COMPARISON:  Abdominal CT 10/28/2015, chest CT 10/29/2015 FINDINGS: Chest: Unremarkable appearance of the chest superficial soft tissues. No axillary or supraclavicular adenopathy. Unremarkable appearance of the thoracic inlet, including the visualized thyroid. Mediastinal lymph nodes are present, including AP window, paratracheal, and sub carinal. None of these are significantly enlarged. Pericardial lymph nodes in the drainage pathway of the liver Unremarkable appearance of the esophagus. Unremarkable course caliber and contour of the thoracic aorta without dissection flap, aneurysm, periaortic fluid. Left-sided filling defects involving segmental and subsegmental branches of the left lower lobe. Subsegmental filling defects of the right lower lobe. Associated atelectatic changes, without consolidation. Ratio of right ventricle to left ventricle measures less than 1. No pleural effusion. No pneumothorax. No confluent airspace disease. Upper abdomen: Multiple metastatic foci of the liver again noted. Partially visualized mass involving the tail of the pancreas along the greater curvature of the stomach, incompletely imaged. Musculoskeletal: No displaced fracture. Review of the MIP images confirms the above findings. IMPRESSION: Study is positive for pulmonary emboli, involving left lower lobe  segmental and subsegmental branches, as well as subsegmental branches of the right lower lobe. No evidence of right-sided heart strain. Re- demonstration of imaging pattern compatible with metastatic pancreatic carcinoma, better characterized on prior CT abdomen. Suspicious lymph nodes in the pericardial nodal stations. These results were called by telephone at the time of interpretation on 10/30/2015 at 9:18 am to the nurse caring for the patient, Ms. Larose Kells, who verbally acknowledged these results. Signed, Dulcy Fanny. Earleen [place], DO [facility] Radiology Electronically Signed   By: Corrie Mckusick D.O.   On: 10/30/2015 09:20   Dg Foot Complete Left  10/30/2015  CLINICAL DATA:  Left foot pain for 3 days.  No known injury. EXAM: LEFT FOOT - COMPLETE 3+ VIEW COMPARISON:  None. FINDINGS: There is no evidence of fracture or dislocation. There is no evidence of arthropathy or other focal bone abnormality. IMPRESSION: Negative. Electronically Signed   By: Earle Gell M.D.   On: 10/30/2015 12:24        Scheduled Meds: . feeding supplement (ENSURE ENLIVE)  237 mL Oral TID BM   Continuous Infusions: . sodium chloride 50 mL/hr at 10/30/15 1353  . heparin 1,600 Units/hr (10/30/15 1052)        Time spent: 20 minutes.    [facility], MD [facility] Pager [phone]  If 7PM-7AM, please contact night-coverage [email or web address] Password [facility] 10/30/2015, 4:17 PM

## 2015-10-30 NOTE — Progress Notes (Signed)
Pt with results positive for dvt to bilateral legs. Provider Hongalgi notified. Awaiting further orders. Will continue to monitor pt.

## 2015-10-31 ENCOUNTER — Inpatient Hospital Stay (HOSPITAL_COMMUNITY): Payer: Medicaid Other

## 2015-10-31 DIAGNOSIS — K869 Disease of pancreas, unspecified: Secondary | ICD-10-CM

## 2015-10-31 DIAGNOSIS — I824Z3 Acute embolism and thrombosis of unspecified deep veins of distal lower extremity, bilateral: Secondary | ICD-10-CM

## 2015-10-31 DIAGNOSIS — I2699 Other pulmonary embolism without acute cor pulmonale: Secondary | ICD-10-CM

## 2015-10-31 DIAGNOSIS — I82491 Acute embolism and thrombosis of other specified deep vein of right lower extremity: Secondary | ICD-10-CM

## 2015-10-31 DIAGNOSIS — R109 Unspecified abdominal pain: Secondary | ICD-10-CM

## 2015-10-31 DIAGNOSIS — C787 Secondary malignant neoplasm of liver and intrahepatic bile duct: Secondary | ICD-10-CM

## 2015-10-31 DIAGNOSIS — C801 Malignant (primary) neoplasm, unspecified: Secondary | ICD-10-CM

## 2015-10-31 DIAGNOSIS — C259 Malignant neoplasm of pancreas, unspecified: Principal | ICD-10-CM

## 2015-10-31 DIAGNOSIS — I82409 Acute embolism and thrombosis of unspecified deep veins of unspecified lower extremity: Secondary | ICD-10-CM | POA: Diagnosis present

## 2015-10-31 DIAGNOSIS — I82492 Acute embolism and thrombosis of other specified deep vein of left lower extremity: Secondary | ICD-10-CM

## 2015-10-31 DIAGNOSIS — M25572 Pain in left ankle and joints of left foot: Secondary | ICD-10-CM | POA: Insufficient documentation

## 2015-10-31 LAB — COMPREHENSIVE METABOLIC PANEL
ALBUMIN: 2.7 g/dL — AB (ref 3.5–5.0)
ALK PHOS: 155 U/L — AB (ref 38–126)
ALT: 32 U/L (ref 17–63)
ANION GAP: 12 (ref 5–15)
AST: 36 U/L (ref 15–41)
BILIRUBIN TOTAL: 0.8 mg/dL (ref 0.3–1.2)
BUN: 6 mg/dL (ref 6–20)
CALCIUM: 8.8 mg/dL — AB (ref 8.9–10.3)
CO2: 23 mmol/L (ref 22–32)
Chloride: 99 mmol/L — ABNORMAL LOW (ref 101–111)
Creatinine, Ser: 0.89 mg/dL (ref 0.61–1.24)
Glucose, Bld: 118 mg/dL — ABNORMAL HIGH (ref 65–99)
POTASSIUM: 3.9 mmol/L (ref 3.5–5.1)
Sodium: 134 mmol/L — ABNORMAL LOW (ref 135–145)
TOTAL PROTEIN: 6.3 g/dL — AB (ref 6.5–8.1)

## 2015-10-31 LAB — CBC
HCT: 34.4 % — ABNORMAL LOW (ref 39.0–52.0)
HEMOGLOBIN: 10.9 g/dL — AB (ref 13.0–17.0)
MCH: 27.4 pg (ref 26.0–34.0)
MCHC: 31.7 g/dL (ref 30.0–36.0)
MCV: 86.4 fL (ref 78.0–100.0)
PLATELETS: 210 10*3/uL (ref 150–400)
RBC: 3.98 MIL/uL — ABNORMAL LOW (ref 4.22–5.81)
RDW: 14.2 % (ref 11.5–15.5)
WBC: 13.6 10*3/uL — ABNORMAL HIGH (ref 4.0–10.5)

## 2015-10-31 LAB — HEPARIN LEVEL (UNFRACTIONATED): HEPARIN UNFRACTIONATED: 0.43 [IU]/mL (ref 0.30–0.70)

## 2015-10-31 LAB — PROTIME-INR
INR: 1.37 (ref 0.00–1.49)
PROTHROMBIN TIME: 17 s — AB (ref 11.6–15.2)

## 2015-10-31 LAB — CANCER ANTIGEN 19-9: CA 19 9: 244572 U/mL — AB (ref 0–35)

## 2015-10-31 MED ORDER — MENTHOL 3 MG MT LOZG: 1.0000 | LOZENGE | OROMUCOSAL | Status: DC | PRN

## 2015-10-31 MED ORDER — FENTANYL CITRATE (PF) 100 MCG/2ML IJ SOLN
INTRAMUSCULAR | Status: AC | PRN
  Administered 2015-10-31 (×2): 50 ug via INTRAVENOUS

## 2015-10-31 MED ORDER — OXYCODONE HCL 5 MG PO TABS
ORAL_TABLET | ORAL | Status: AC
  Administered 2015-10-31: 7.5 mg via ORAL
  Filled 2015-10-31: qty 2

## 2015-10-31 MED ORDER — MIDAZOLAM HCL 2 MG/2ML IJ SOLN
INTRAMUSCULAR | Status: AC
  Filled 2015-10-31: qty 4

## 2015-10-31 MED ORDER — LIDOCAINE HCL (PF) 1 % IJ SOLN
INTRAMUSCULAR | Status: AC
  Filled 2015-10-31: qty 10

## 2015-10-31 MED ORDER — OXYCODONE HCL ER 15 MG PO T12A
15.0000 mg | EXTENDED_RELEASE_TABLET | Freq: Two times a day (BID) | ORAL | Status: DC
  Administered 2015-10-31 – 2015-11-02 (×5): 15 mg via ORAL
  Filled 2015-10-31 (×5): qty 1

## 2015-10-31 MED ORDER — BOOST / RESOURCE BREEZE PO LIQD
1.0000 | Freq: Three times a day (TID) | ORAL | Status: DC
  Administered 2015-11-01 – 2015-11-03 (×7): 1 via ORAL
  Administered 2015-11-04: 0.9875 via ORAL
  Administered 2015-11-05 – 2015-11-06 (×3): 1 via ORAL

## 2015-10-31 MED ORDER — SENNOSIDES-DOCUSATE SODIUM 8.6-50 MG PO TABS
1.0000 | ORAL_TABLET | Freq: Every day | ORAL | Status: DC
  Administered 2015-10-31 – 2015-11-01 (×2): 1 via ORAL
  Filled 2015-10-31 (×2): qty 1

## 2015-10-31 MED ORDER — MIDAZOLAM HCL 2 MG/2ML IJ SOLN
INTRAMUSCULAR | Status: AC | PRN
  Administered 2015-10-31 (×2): 1 mg via INTRAVENOUS
  Administered 2015-10-31: 2 mg via INTRAVENOUS

## 2015-10-31 MED ORDER — HEPARIN (PORCINE) IN NACL 100-0.45 UNIT/ML-% IJ SOLN
1950.0000 [IU]/h | INTRAMUSCULAR | Status: DC
  Administered 2015-10-31 – 2015-11-01 (×3): 1950 [IU]/h via INTRAVENOUS
  Filled 2015-10-31 (×2): qty 250

## 2015-10-31 MED ORDER — BISACODYL 10 MG RE SUPP
10.0000 mg | Freq: Every day | RECTAL | Status: DC | PRN
  Administered 2015-11-02: 10 mg via RECTAL
  Filled 2015-10-31: qty 1

## 2015-10-31 MED ORDER — FENTANYL CITRATE (PF) 100 MCG/2ML IJ SOLN
INTRAMUSCULAR | Status: AC
  Filled 2015-10-31: qty 2

## 2015-10-31 MED ORDER — ALPRAZOLAM 0.5 MG PO TABS
0.5000 mg | ORAL_TABLET | Freq: Three times a day (TID) | ORAL | Status: DC | PRN
  Administered 2015-10-31 – 2015-11-07 (×12): 0.5 mg via ORAL
  Filled 2015-10-31 (×12): qty 1

## 2015-10-31 NOTE — Progress Notes (Signed)
Spoke with provider in regards to pt's complaint of throat swelling. Provider, Hongagli to assess pt.

## 2015-10-31 NOTE — Progress Notes (Signed)
Daily Progress Note   Patient Name: Nathan [name]       Date: 10/31/2015 DOB: 07-Jul-1970  Age: 45 y.o. MRN#: [id number] Attending Physician: Modena Jansky, MD Primary Care Physician: No primary care provider on file. Admit Date: 10/28/2015  Reason for Consultation/Follow-up: Pain control  Subjective:  awake alert, sitting up in bed, girlfriend at bedside.  Pain in leg, abdomen. Patient now on OxyIR 7.5 mg. Patient not asking for IV Morphine PRN, wants to avoid IV opioids if at all possible. Discussed with patient about asking for IV Morphine PRN if pain is more than 6-7/10.  Met with Dr Algis Liming this am in the patient's room. Patient has required around 50 mg PO Oxy IR in the past 24 hours.  Add Oxycontin, expand bowel regimen, also with anxiety related to this serious illness, will add low dose Xanax PRN PO. See below.  He is being set up to see Dr Alvy Bimler as outpatient for his newly diagnosed possible metastatic pancreatic cancer.  He has a 43 year old at home. He becomes anxious and tearful at times. Continue to support holistically.   Length of Stay: 1  Current Medications: Scheduled Meds:  . feeding supplement (ENSURE ENLIVE)  237 mL Oral TID BM  . oxyCODONE  15 mg Oral Q12H  . senna-docusate  1 tablet Oral QHS    Continuous Infusions: . sodium chloride 50 mL/hr at 10/30/15 2140  . heparin Stopped (10/31/15 0742)    PRN Meds: acetaminophen **OR** acetaminophen, bisacodyl, morphine injection, ondansetron **OR** ondansetron (ZOFRAN) IV, oxyCODONE, polyethylene glycol  Physical Exam         Awake alert Pain in leg and abdomen S1 S2 Clear Abdomen soft mildly distended No edema Non focal   Vital Signs: BP 133/82 mmHg  Pulse 86  Temp(Src) 98.6 F (37 C) (Oral)  Resp 18   Ht 5' 10.8" (1.798 m)  Wt 88.315 kg (194 lb 11.2 oz)  BMI 27.32 kg/m2  SpO2 98% SpO2: SpO2: 98 % O2 Device: O2 Device: Not Delivered O2 Flow Rate:    Intake/output summary:  Intake/Output Summary (Last 24 hours) at 10/31/15 1004 Last data filed at 10/31/15 0600  Gross per 24 hour  Intake 1004.5 ml  Output      4 ml  Net 1000.5 ml   LBM: Last  BM Date: 10/29/15 Baseline Weight: Weight: 88.315 kg (194 lb 11.2 oz) Most recent weight: Weight: 88.315 kg (194 lb 11.2 oz)       Palliative Assessment/Data:    Flowsheet Rows        Most Recent Value   Intake Tab    Referral Department  Hospitalist   Unit at Time of Referral  Oncology Unit   Palliative Care Primary Diagnosis  Cancer   Palliative Care Type  Return patient Palliative Care   Reason for referral  Pain, Non-pain Symptom, Clarify Goals of Care   Date first seen by Palliative Care  10/29/15   Clinical Assessment    Palliative Performance Scale Score  40%   Pain Max last 24 hours  8   Pain Min Last 24 hours  5   Psychosocial & Spiritual Assessment    Palliative Care Outcomes    Patient/Family meeting held?  Yes   Who was at the meeting?  patient, girlfriend, Dr Algis Liming    Palliative Care Outcomes  Clarified goals of care, Improved pain interventions   Palliative Care follow-up planned  Yes, Home   Palliative Care Follow-up Reason  Pain, Clarify goals of care      Patient Active Problem List   Diagnosis Date Noted  . Acute pulmonary embolism ([place]) 10/31/2015  . Acute deep vein thrombosis (DVT) of lower extremity ([place]) 10/31/2015  . 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 Encounter for palliative care   . Goals of care, counseling/discussion   . Upper abdominal pain 10/28/2015  . Weight loss 10/28/2015    Palliative Care Assessment & Plan   Patient Profile:  45 yo with recently diagnosed possible metastatic pancreatic cancer.   Assessment:  Cancer pancreas metastatic to liver DVT  PE Uncontrolled pain Uncontrolled anxiety.   Recommendations/Plan:   Add OxyContin 15 mg PO BID scheduled  Agree with Oxy IR 7.5 mg PO PRN  Expand bowel regimen. Monitor BMs.   Liver biopsy today, on anti coagulation for recently diagnosed DVT PE  Add low dose Xanax PO PRN. Discussed with patient.   Discussed with patient about selecting a HCPOA agent, notified him that chaplains could complete the documentation while he is in the hospital.   Goals of Care and Additional Recommendations:  Limitations on Scope of Treatment: Full Scope Treatment  Code Status:    Code Status Orders        Start     Ordered   10/28/15 1824  Full code   Continuous     10/28/15 1823    Code Status History    Date Active Date Inactive Code Status Order ID Comments User Context   This patient has a current code status but no historical code status.       Prognosis:   Unable to determine  Discharge Planning:  Home with [place] was discussed with  Patient, girlfriend, Dr Algis Liming.   Thank you for allowing the Palliative Medicine Team to assist in the care of this patient.   Time In:  9 Time Out: 935 Total Time 35 Prolonged Time Billed  no       Greater than 50%  of this time was spent counseling and coordinating care related to the above assessment and plan.  Loistine Chance, MD [phone]  Please contact Palliative Medicine Team phone at [phone] for questions and concerns.

## 2015-10-31 NOTE — Progress Notes (Signed)
PROGRESS NOTE  Nathan [name]  [id number] DOB: 14-Dec-1970  DOA: 10/28/2015 PCP: No primary care provider on file.  Outpatient Specialists:  None  Brief Narrative:  45 year old male with no significant past medical history, presented to ED with 1 month history of upper abdominal pain radiating to back, 30 pound weight loss over the last couple of months, decreased appetite. CT abdomen and pelvis suggested pancreatic cancer with metastasis to liver, spleen and both kidneys. Interventional radiology consulted and plan ultrasound-guided biopsy on 5/4. Palliative care consulted for goals of care. CTA chest confirmed bilateral PE. Lower extremity venous Dopplers confirmed bilateral DVT. Started on IV heparin per pharmacy. Oncology consulted.   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 - Abdominal pain better controlled than admission but only for 2 hours. Adjusted pain medications. Better. - Oncology consultation 5/4 appreciated: Likely malignancy until proven otherwise. Recommend Port-A-Cath placement while hospitalized to expedite treatment in the near future and he will be on long-term oral antibiotic therapy as outpatient. Outpatient appointment on 11/04/15 to discuss test results. - Palliative care team assisting with pain management. - Heparin infusion stopped this morning for liver biopsy later today. Port-A-Cath placement possibly for 5/5.  Acute bilateral pulmonary embolism and bilateral lower extremity DVT's - CT chest with contrast 5/2 was read by radiologist  as suspicious for pulmonary embolism. I called and discussed in detail with the reading radiologist who reiterated his strong suspicion for pulmonary embolism and recommended a dose of full dose Lovenox, follow-up CTA chest and then proceed as needed. Patient received a full dose of Lovenox on 5/2 PM - CTA chest 5/3 confirmed bilateral acute PE without right heart strain. Bilateral lower extremity venous Doppler confirms DVT in bilateral lower extremities. - Started IV heparin per pharmacy. As per oncology, after liver biopsy and Port-A-Cath placement, he can be transitioned to oral DOAC (Xarelto or Eliquis). Discussed with case management regarding assistance with medications.  Anemia of chronic disease - Stable. Follow CBCs periodically.  Left ankle and foot pain - No history of trauma. X-rays without acute findings.? Related to DVT versus other musculoskeletal skeletal etiology. Pain management. K pad. Better.   Constipation - Bowel regimen.   DVT prophylaxis: IV heparin drip. Code Status: Full Family Communication: Discussed with patient's girlfriend at bedside. Disposition Plan: DC home when medically stable, possibly 5/6.   Consultants:   Interventional radiology  Palliative care medicine  Oncology  Procedures:   Bilateral lower extremity venous Dopplers 10/30/15: Vascular Ultrasound Lower extremity venous duplex has been completed. Preliminary findings: DVT noted in the Right peroneal veins, Left posterior tibial veins, and Left peroneal veins. Superficial thrombosis noted in the left small saphenous vein.  Antimicrobials:   None    Subjective: Abdominal pain and left foot pain are better. Able to weight bear on left lower extremity. Complained of mild sore throat. Last BM 2 days ago.  Objective:  Filed Vitals:   10/30/15 1953 10/30/15 2303 10/31/15 0646 10/31/15 1253  BP: 147/95  133/82 152/100  Pulse: 100  86 78  Temp: 100.3 F (37.9 C) 98.1 F (36.7 C) 98.6  F (37 C)   TempSrc: Oral Oral Oral   Resp: 16  18 18   Height:      Weight:      SpO2: 96%  98% 96%    Intake/Output Summary (Last 24 hours) at 10/31/15 1412 Last data filed at 10/31/15 0600  Gross per 24 hour  Intake 1004.5 ml  Output      4 ml  Net 1000.5 ml   Filed Weights   10/28/15 2012  Weight: 88.315 kg (194 lb 11.2 oz)    Examination:  General exam: Pleasant young male sitting up comfortably in bed. Appears calm and comfortable  ENT: Throat exam with minimal patchy erythema near the uvula but no drainage or other acute findings.  Respiratory system: Clear to auscultation. Respiratory effort normal. Cardiovascular system: S1 & S2 heard, RRR.Marland Kitchen No JVD, murmurs, rubs, gallops or clicks. No pedal edema. Gastrointestinal system: Abdomen is nondistended, soft and mild epigastric region tenderness without peritoneal signs . No organomegaly or masses felt. Normal bowel sounds heard. Central nervous system: Alert and oriented. No focal neurological deficits. Extremities: Symmetric 5 x 5 power.Left ankle, mildly swollen, increased warmth but no erythema or skin lesions. Mild tenderness-improved. Good peripheral pulses felt. Skin: No rashes, lesions or ulcers Psychiatry: Judgement and insight appear normal. Mood & affect appropriate.     Data Reviewed: I have personally reviewed following labs and imaging studies  CBC:  Recent Labs Lab 10/28/15 1138 10/28/15 1856 10/29/15 0439 10/31/15 0207  WBC 9.4 11.0* 10.9* 13.6*  HGB 12.4* 11.8* 11.4* 10.9*  HCT 37.7* 35.7* 34.8* 34.4*  MCV 83.0 82.4 83.7 86.4  PLT 202 189 182 [id number]   Basic Metabolic Panel:  Recent Labs Lab 10/28/15 1138 10/28/15 1856 10/29/15 0439 10/30/15 0527 10/31/15 0207  NA 137 137 139 136 134*  K 4.0 3.8 4.3 4.0 3.9  CL 102 102 105 101 99*  CO2 22 23 23 25 23   GLUCOSE 108* 110* 111* 109* 118*  BUN 9 7 8  <5* 6  CREATININE 0.88 0.96 0.99 0.95 0.89  CALCIUM 10.0 9.5 9.3 9.0 8.8*    GFR: Estimated Creatinine Clearance: 113.2 mL/min (by C-G formula based on Cr of 0.89). Liver Function Tests:  Recent Labs Lab 10/28/15 1138 10/31/15 0207  AST 33 36  ALT 36 32  ALKPHOS 146* 155*  BILITOT 0.7 0.8  PROT 7.6 6.3*  ALBUMIN 3.5 2.7*    Recent Labs Lab 10/28/15 1138  LIPASE 26   No results for input(s): AMMONIA in the last 168 hours. Coagulation Profile:  Recent Labs Lab 10/29/15 1105 10/30/15 0527 10/31/15 0207  INR 1.65* 1.40 1.37   Cardiac Enzymes: No results for input(s): CKTOTAL, CKMB, CKMBINDEX, TROPONINI in the last 168 hours. BNP (last 3 results) No results for input(s): PROBNP in the last 8760 hours. HbA1C: No results for input(s): HGBA1C in the last 72 hours. CBG: No results for input(s): GLUCAP in the last 168 hours. Lipid Profile: No results for input(s): CHOL, HDL, LDLCALC, TRIG, CHOLHDL, LDLDIRECT in the last 72 hours. Thyroid Function Tests: No results for input(s): TSH, T4TOTAL, FREET4, T3FREE, THYROIDAB in the last 72 hours. Anemia Panel: No results for input(s): VITAMINB12, FOLATE, FERRITIN, TIBC, IRON, RETICCTPCT in the last 72 hours. Urine analysis:    Component Value Date/Time   COLORURINE AMBER* 10/28/2015 [place] 10/28/2015 1313   LABSPEC 1.027 10/28/2015 1313   PHURINE 6.0 10/28/2015 1313  GLUCOSEU NEGATIVE 10/28/2015 1313   HGBUR MODERATE* 10/28/2015 1313   BILIRUBINUR SMALL* 10/28/2015 1313   KETONESUR 15* 10/28/2015 1313   PROTEINUR 30* 10/28/2015 1313   NITRITE NEGATIVE 10/28/2015 1313   [place] 10/28/2015 1313        Radiology Studies: Dg Ankle Complete Left  11-11-2015  CLINICAL DATA:  Left ankle pain for 3 days.  No known injury. EXAM: LEFT ANKLE COMPLETE - 3+ VIEW COMPARISON:  None. FINDINGS: There is no evidence of fracture, dislocation, or joint effusion. Minimal degenerative spurring is seen along the inferior aspect of the medial malleolus in the anterior margin of  the distal tibia. No evidence of joint space narrowing. Soft tissues are unremarkable. IMPRESSION: No acute findings.  Mild degenerative spurring. Electronically Signed   By: Earle Gell M.D.   On: 11-Nov-2015 12:20   Ct Chest W Contrast  10/29/2015  CLINICAL DATA:  Chest pain for 1 day EXAM: CT CHEST WITH CONTRAST TECHNIQUE: Multidetector CT imaging of the chest was performed during intravenous contrast administration. CONTRAST:  34mL ISOVUE-300 IOPAMIDOL (ISOVUE-300) INJECTION 61% COMPARISON:  None FINDINGS: The lungs are well aerated bilaterally. No focal infiltrate or sizable effusion is seen. No focal parenchymal nodule is noted. No sizable effusion or pneumothorax is noted. The thoracic inlet is within normal limits. The thoracic aorta and its branches are unremarkable. Pulmonary artery is incompletely evaluated due to timing of the contrast bolus. No significant hilar or mediastinal adenopathy is noted. Scanning into the upper abdomen demonstrates multiple hypodense lesions throughout the liver consistent with the patient's given clinical history of metastatic pancreas carcinoma to the liver. These are stable from a CT from the previous day. A few small hypodensities are also noted within the spleen also suggestive of metastatic disease. Persistent mass in the tail of the pancreas is noted. These changes were better evaluated on the recent CT examination of the abdomen and pelvis. No acute bony abnormality is seen. IMPRESSION: Changes consistent with pancreatic mass and metastatic disease as seen on the previous CT examination. The timing of the contrast bolus limits evaluation for potential pulmonary embolus. When compared with the exam from the previous day, however there are findings suggestive of pulmonary embolism. No other intrathoracic abnormality is noted. These results were called by telephone at the time of interpretation on 10/29/2015 at 6:21 pm to [facility] the pts nurse, who verbally acknowledged  these results. Electronically Signed   By: Inez Catalina M.D.   On: 10/29/2015 18:21   Ct Angio Chest Pe W/cm &/or Wo Cm  2015-11-11  CLINICAL DATA:  45 year old male with new imaging diagnosis of metastatic pancreatic carcinoma, 10/28/2015. Chest CT 10/29/2015 suggests pulmonary embolism. EXAM: CT ANGIOGRAPHY CHEST WITH CONTRAST TECHNIQUE: Multidetector CT imaging of the chest was performed using the standard protocol during bolus administration of intravenous contrast. Multiplanar CT image reconstructions and MIPs were obtained to evaluate the vascular anatomy. CONTRAST:  100 cc Isovue 370 COMPARISON:  Abdominal CT 10/28/2015, chest CT 10/29/2015 FINDINGS: Chest: Unremarkable appearance of the chest superficial soft tissues. No axillary or supraclavicular adenopathy. Unremarkable appearance of the thoracic inlet, including the visualized thyroid. Mediastinal lymph nodes are present, including AP window, paratracheal, and sub carinal. None of these are significantly enlarged. Pericardial lymph nodes in the drainage pathway of the liver Unremarkable appearance of the esophagus. Unremarkable course caliber and contour of the thoracic aorta without dissection flap, aneurysm, periaortic fluid. Left-sided filling defects involving segmental and subsegmental branches of the left lower lobe. Subsegmental filling  defects of the right lower lobe. Associated atelectatic changes, without consolidation. Ratio of right ventricle to left ventricle measures less than 1. No pleural effusion. No pneumothorax. No confluent airspace disease. Upper abdomen: Multiple metastatic foci of the liver again noted. Partially visualized mass involving the tail of the pancreas along the greater curvature of the stomach, incompletely imaged. Musculoskeletal: No displaced fracture. Review of the MIP images confirms the above findings. IMPRESSION: Study is positive for pulmonary emboli, involving left lower lobe segmental and subsegmental  branches, as well as subsegmental branches of the right lower lobe. No evidence of right-sided heart strain. Re- demonstration of imaging pattern compatible with metastatic pancreatic carcinoma, better characterized on prior CT abdomen. Suspicious lymph nodes in the pericardial nodal stations. These results were called by telephone at the time of interpretation on 10/30/2015 at 9:18 am to the nurse caring for the patient, Ms. Larose Kells, who verbally acknowledged these results. Signed, Dulcy Fanny. Earleen Newport, DO [facility] Electronically Signed   By: Corrie Mckusick D.O.   On: 10/30/2015 09:20   Dg Foot Complete Left  10/30/2015  CLINICAL DATA:  Left foot pain for 3 days.  No known injury. EXAM: LEFT FOOT - COMPLETE 3+ VIEW COMPARISON:  None. FINDINGS: There is no evidence of fracture or dislocation. There is no evidence of arthropathy or other focal bone abnormality. IMPRESSION: Negative. Electronically Signed   By: Earle Gell M.D.   On: 10/30/2015 12:24        Scheduled Meds: . feeding supplement  1 Container Oral TID BM  . oxyCODONE  15 mg Oral Q12H  . senna-docusate  1 tablet Oral QHS   Continuous Infusions: . sodium chloride 50 mL/hr at 10/30/15 2140  . heparin Stopped (10/31/15 0742)     LOS: 1 day    Time spent: 20 minutes.    [facility], MD [facility] Pager [phone]  If 7PM-7AM, please contact night-coverage [email or web address] Password [email or web address] 10/31/2015, 2:12 PM

## 2015-10-31 NOTE — Sedation Documentation (Signed)
Patient is resting comfortably. 
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
## 2015-10-31 NOTE — Progress Notes (Signed)
Nutrition Follow-up  DOCUMENTATION CODES:   Not applicable  INTERVENTION:   -D/c Ensure Enlive po TID, each supplement provides 350 kcal and 20 grams of protein, due to poor acceptance -Boost Breeze po TID, each supplement provides 250 kcal and 9 grams of protein  NUTRITION DIAGNOSIS:   Inadequate oral intake related to poor appetite as evidenced by per patient/family report.  Ongoing  GOAL:   Patient will meet greater than or equal to 90% of their needs  Unmet  MONITOR:   PO intake, Supplement acceptance, Labs, Weight trends, Skin, I & O's  REASON FOR ASSESSMENT:   Malnutrition Screening Tool    ASSESSMENT:   Nathan [name] is a 45 y.o. male with a Past Medical History of abd hernia who presents with likely metastatic pancreatic cancer. Pt aware of likely dx and poor prognosis. Very unfortunate condition for pt. Aggressive Dx workup and pain treatment at this time.   Pt in with MD at time of visit.   Pt NPO for liver biopsy today.   Case discussed with RN. She reports liver biopsy was delayed secondary to pt developing DVT in bilateral legs. Palliative care and oncology following pt for pain control and treatment options. Per RN, plan to place port-a-cath prior to discharge and follow-up with oncology clinic next week.   Pt's intake continues to be poor. Per RN, pt is refusing Ensure supplements. She reveals pt is taking minimal po's and mainly consumes gingerale.   Labs reviewed: Na: 134 (on IV supplementation).   Diet Order:  Diet NPO time specified Except for: Sips with Meds  Skin:  Reviewed, no issues  Last BM:  10/29/15  Height:   Ht Readings from Last 1 Encounters:  10/28/15 5' 10.8" (1.798 m)    Weight:   Wt Readings from Last 1 Encounters:  10/28/15 194 lb 11.2 oz (88.315 kg)    Ideal Body Weight:  75.5 kg  BMI:  Body mass index is 27.32 kg/(m^2).  Estimated Nutritional Needs:   Kcal:  2000-2200  Protein:  105-120 grams  Fluid:   2.0-2.2 L  EDUCATION NEEDS:   No education needs identified at this time  Kynzley Dowson A. Jimmye Norman, RD, LDN, CDE Pager: [phone] After hours Pager: [phone]

## 2015-10-31 NOTE — Procedures (Signed)
US guided core biopsies of right hepatic lesion.  3 cores obtained.  Minimal blood loss and no immediate complication.  Ok to re-start heparin in 3 hours.

## 2015-10-31 NOTE — Hospital Discharge Follow-Up (Signed)
[facility] [place]:  This Case Manager received call from Whitman Hero, RN CM who indicated patient does not have a PCP and is uninsured. Hospitalized for metastatic pancreatic CA, acute bilateral pulmonary embolism and bilateral lower extremity DVTs.  Hospital follow-up appointment scheduled for 11/06/15 at 1200 with Dr. Asa Lente at [facility]. AVS updated. Whitman Hero, RN CM indicated patient may be discharged on Lovenox (uncertain of dosage). This Case Manager spoke with Orland Dec at [facility] [place] who indicated pharmacy only has Lovenox 100 mg in stock. She indicated [place] letter would be needed for this medication. Whitman Hero, RN CM updated.

## 2015-10-31 NOTE — Progress Notes (Signed)
[place] [facility] for Heparin Indication: pulmonary embolus in setting of metastatic pancreatic cancer   No Known Allergies  Patient Measurements: Height: 5' 10.8" (179.8 cm) Weight: 194 lb 11.2 oz (88.315 kg) IBW/kg (Calculated) : 74.84   Vital Signs: Temp: 98.1 F (36.7 C) (05/03 2303) Temp Source: Oral (05/03 2303) BP: 147/95 mmHg (05/03 1953) Pulse Rate: 100 (05/03 1953)  Labs:  Recent Labs  10/28/15 1138 10/28/15 1856 10/29/15 0439 10/29/15 1105 10/30/15 0527 10/30/15 1639 10/31/15 0046  HGB 12.4* 11.8* 11.4*  --   --   --   --   HCT 37.7* 35.7* 34.8*  --   --   --   --   PLT 202 189 182  --   --   --   --   APTT  --   --   --  38*  --   --   --   LABPROT  --   --   --  19.5* 17.3*  --   --   INR  --   --   --  1.65* 1.40  --   --   HEPARINUNFRC  --   --   --   --   --  0.14* 0.43  CREATININE 0.88 0.96 0.99  --  0.95  --   --     Medical History: Past Medical History  Diagnosis Date  . Abdominal hernia   . Metastatic cancer ([place]) 10/28/2015    Assessment: 67 yoM on heparin for bilateral PE and bilateral DVT. Heparin level now therapeutic on 1950 units/hr. No bleeding noted.  Goal of Therapy:  Monitor platelets by anticoagulation protocol: Yes   Plan:  Continue heparin at 1950 units/hr  Will f/u confirmatory heparin level in 6 hours  Sherlon Handing, PharmD, BCPS Clinical pharmacist, pager [phone] 10/31/2015 2:39 AM

## 2015-10-31 NOTE — Progress Notes (Signed)
[place] for Heparin Indication: bilateral pulmonary embolus and bilateral DVT in the setting of metastatic pancreatic cancer   No Known Allergies  Patient Measurements: Height: 5' 10.8" (179.8 cm) Weight: 194 lb 11.2 oz (88.315 kg) IBW/kg (Calculated) : 74.84   Vital Signs: Temp: 98.3 F (36.8 C) (05/04 1624) Temp Source: Oral (05/04 1624) BP: 147/82 mmHg (05/04 1624) Pulse Rate: 88 (05/04 1624)  Labs:  Recent Labs  10/28/15 1856 10/29/15 0439 10/29/15 1105 10/30/15 0527 10/30/15 1639 10/31/15 0046 10/31/15 0207  HGB 11.8* 11.4*  --   --   --   --  10.9*  HCT 35.7* 34.8*  --   --   --   --  34.4*  PLT 189 182  --   --   --   --  210  APTT  --   --  38*  --   --   --   --   LABPROT  --   --  19.5* 17.3*  --   --  17.0*  INR  --   --  1.65* 1.40  --   --  1.37  HEPARINUNFRC  --   --   --   --  0.14* 0.43  --   CREATININE 0.96 0.99  --  0.95  --   --  0.89    Medical History: Past Medical History  Diagnosis Date  . Abdominal hernia   . Metastatic cancer ([place]) 10/28/2015    Assessment: 45 yo M on heparin for bilateral PE and bilateral DVT. Heparin level was previously therapeutic on 1950 units/hr. Heparin was held today at noon for liver biopsy.  Post biopsy, MD instructed heparin can be restarted at 1900 this evening.  Will restart at previous therapeutic rate.  Goal of Therapy:  Monitor platelets by anticoagulation protocol: Yes   Plan:  Restart heparin at 1950 units/hr  (at 1900 tonight) Will f/u confirmatory heparin level in 8 hours (0300 5/5)  [employer], Pharm.D., BCPS Clinical Pharmacist Pager [phone] 10/31/2015 4:32 PM

## 2015-11-01 ENCOUNTER — Other Ambulatory Visit: Payer: Self-pay | Admitting: Hematology and Oncology

## 2015-11-01 DIAGNOSIS — K5900 Constipation, unspecified: Secondary | ICD-10-CM

## 2015-11-01 DIAGNOSIS — E46 Unspecified protein-calorie malnutrition: Secondary | ICD-10-CM

## 2015-11-01 LAB — CBC
HEMATOCRIT: 32.4 % — AB (ref 39.0–52.0)
HEMOGLOBIN: 10.4 g/dL — AB (ref 13.0–17.0)
MCH: 27.6 pg (ref 26.0–34.0)
MCHC: 32.1 g/dL (ref 30.0–36.0)
MCV: 85.9 fL (ref 78.0–100.0)
Platelets: 216 10*3/uL (ref 150–400)
RBC: 3.77 MIL/uL — ABNORMAL LOW (ref 4.22–5.81)
RDW: 14.3 % (ref 11.5–15.5)
WBC: 11.1 10*3/uL — ABNORMAL HIGH (ref 4.0–10.5)

## 2015-11-01 LAB — HEPARIN LEVEL (UNFRACTIONATED)
HEPARIN UNFRACTIONATED: 0.35 [IU]/mL (ref 0.30–0.70)
HEPARIN UNFRACTIONATED: 0.33 [IU]/mL (ref 0.30–0.70)

## 2015-11-01 MED ORDER — HEPARIN (PORCINE) IN NACL 100-0.45 UNIT/ML-% IJ SOLN
2300.0000 [IU]/h | INTRAMUSCULAR | Status: DC
  Administered 2015-11-02: 2050 [IU]/h via INTRAVENOUS
  Administered 2015-11-02 – 2015-11-04 (×3): 2300 [IU]/h via INTRAVENOUS
  Filled 2015-11-01 (×5): qty 250

## 2015-11-01 NOTE — Progress Notes (Signed)
ANTICOAGULATION CONSULT NOTE - FOLLOW UP    HL = 0.33 (goal 0.3 - 0.7 units/mL) Heparin dosing weight = 75 kg   Assessment: 45 YOM continues on IV heparin for bilateral PEs and DVTs.  Heparin level is therapeutic and trending down.  No bleeding reported.   Plan: - Increase heparin gtt slightly to 2050 units/hr - F/U AM labs    Sofya Moustafa D. Mina Marble, PharmD, BCPS 11/01/2015, 7:32 PM
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
## 2015-11-01 NOTE — Progress Notes (Signed)
Nathan [name]   DOB:[birth year]   [id number]    Subjective: The patient continues to have persistent pain but better controlled. He complained of some constipation. Results from biopsy is pending. Port placement has been deferred till Monday due to scheduling issues.  Objective:  Filed Vitals:   10/31/15 2239 11/01/15 0503  BP: 137/84 130/88  Pulse: 99 83  Temp: 99.7 F (37.6 C) 98.9 F (37.2 C)  Resp: 18 18     Intake/Output Summary (Last 24 hours) at 11/01/15 1449 Last data filed at 11/01/15 1400  Gross per 24 hour  Intake    237 ml  Output      0 ml  Net    237 ml    GENERAL:alert, no distress and comfortable. Somewhat sleepy but rousable SKIN: skin color, texture, turgor are normal, no rashes or significant lesions Musculoskeletal:no cyanosis of digits and no clubbing  NEURO: alert & oriented x 3 with fluent speech, no focal motor/sensory deficits   Labs:  Lab Results  Component Value Date   WBC 11.1* 11/01/2015   HGB 10.4* 11/01/2015   HCT 32.4* 11/01/2015   MCV 85.9 11/01/2015   PLT 216 11/01/2015    Lab Results  Component Value Date   NA 134* 10/31/2015   K 3.9 10/31/2015   CL 99* 10/31/2015   CO2 23 10/31/2015    Studies:  US Biopsy  11/01/2015  INDICATION: 45 year old with pancreatic lesion and multiple liver lesions. Tissue diagnosis is needed. EXAM: ULTRASOUND-GUIDED LIVER LESION BIOPSY MEDICATIONS: None. ANESTHESIA/SEDATION: Moderate (conscious) sedation was employed during this procedure. A total of Versed 4.0 mg and Fentanyl 100 mcg was administered intravenously. Moderate Sedation Time: 23 minutes. The patient's level of consciousness and vital signs were monitored continuously by radiology nursing throughout the procedure under my direct supervision. FLUOROSCOPY TIME:  None COMPLICATIONS: None immediate. PROCEDURE: The procedure was explained to the patient. The risks and benefits of the procedure were discussed and the patient's questions were  addressed. Informed consent was obtained from the patient. Liver was evaluated with ultrasound. Patient has known multiple lesions throughout the liver but these are poorly characterized with ultrasound. Most lesions are vague hyperechoic areas. A relatively well-defined nodular lesion in the right hepatic lobe was targeted. The right side of the abdomen was prepped with chlorhexidine and a sterile field was created. The skin and soft tissues were anesthetized with 1% lidocaine. 17 gauge needle directed into the lesion. A total of 3 core biopsies were obtained with an 18 gauge device. Specimens were placed in formalin. Gel-Foam was placed through the 17 gauge needle during removal. Bandage placed over the puncture site. Ultrasound images were taken and saved for this procedure. FINDINGS: Vague hyperechoic areas throughout the liver consistent with known lesions. Relatively well-defined 1 cm lesion in the right hepatic lobe just below the capsule was sampled. No significant bleeding following the core biopsies. Biopsy needle was confirmed within this lesion during the procedure. IMPRESSION: Ultrasound-guided core biopsies of a right hepatic lesion. Electronically Signed   By: Markus Daft M.D.   On: 11/01/2015 08:11    Assessment & Plan:   Pancreatic mass with diffuse metastatic lesions in the liver, likely malignancy until proven otherwise Unfortunately, it appears that this patient probably has widespread metastatic cancer, likely pancreatic primary to the liver. He would benefit from systemic treatment in the near future, pending results of liver biopsy.  I would recommend port placement while hospitalized to expedite treatment in the new future I have  tentatively scheduled him to return to see me in the outpatient clinic on 11/04/2015, will move that appointment to 11/05/15 to discuss test results  Cancer associated pain Appreciate palliative care consult in pain management. Hopefully we can get his pain  under controlled by the time for discharge  Right lower extremity DVT and PE on anticoagulation therapy I agree with IV heparin for now. Due to plan for long-term anticoagulation therapy, after liver biopsy and port placement, he can be transitioned to oral DOAC (Xarelto or Eliquis are OK) I do not recommend port placement as an outpatient due to plan for long-term oral anticoagulation therapy. It has to be done as an inpatient.  Anemia of chronic disease This is likely anemia of chronic disease. The patient denies recent history of bleeding such as epistaxis, hematuria or hematochezia. He is asymptomatic from the anemia. We will observe for now. He does not require transfusion now.   Protein calorie malnutrition Recommend increase oral intake as tolerated. Likely related to malignancy  Constipation On bowel regimen as recommended by palliative care service  Discharge planning Appreciate assistance from palliative care team. He should get advance directives and living well finalized while hospitalized After liver biopsy and port placement, if bleeding complications are not observed, he can be discharged on Monday and follow with me in the outpatient clinic as scheduled. I will sign off. Call if questions arise.   [facility], [place], MD 11/01/2015  2:49 PM

## 2015-11-01 NOTE — Progress Notes (Signed)
Patient ID: Nathan [name], male   DOB: 07/06/1970, 45 y.o.   MRN: [id number]   Pt scheduled for [facility] placement in IR 5/8 am PA will see pt asap for consent  IR will call for pt 5/8 am  Dr Algis Liming aware and agreeable

## 2015-11-01 NOTE — Progress Notes (Signed)
[place] for Heparin Indication: bilateral pulmonary embolus and bilateral DVT in the setting of metastatic pancreatic cancer   No Known Allergies  Patient Measurements: Height: 5' 10.8" (179.8 cm) Weight: 194 lb 11.2 oz (88.315 kg) IBW/kg (Calculated) : 74.84   Vital Signs: Temp: 98.9 F (37.2 C) (05/05 0503) Temp Source: Oral (05/05 0503) BP: 130/88 mmHg (05/05 0503) Pulse Rate: 83 (05/05 0503)  Labs:  Recent Labs  10/29/15 1105 10/30/15 0527 10/30/15 1639 10/31/15 0046 10/31/15 0207 11/01/15 0456  HGB  --   --   --   --  10.9* 10.4*  HCT  --   --   --   --  34.4* 32.4*  PLT  --   --   --   --  210 216  APTT 38*  --   --   --   --   --   LABPROT 19.5* 17.3*  --   --  17.0*  --   INR 1.65* 1.40  --   --  1.37  --   HEPARINUNFRC  --   --  0.14* 0.43  --  0.35  CREATININE  --  0.95  --   --  0.89  --     Medical History: Past Medical History  Diagnosis Date  . Abdominal hernia   . Metastatic cancer ([place]) 10/28/2015    Assessment: 45 yo M on heparin for bilateral PE and bilateral DVT. Heparin level therapeutic. CBC stable.  Goal of Therapy:  Monitor platelets by anticoagulation protocol: Yes   Plan:  Continu heparin at 1950 units/hr   Daily heparin level and CBC  Sherlon Handing, PharmD, BCPS Clinical pharmacist, pager [phone] 11/01/2015 6:16 AM

## 2015-11-01 NOTE — Progress Notes (Addendum)
PROGRESS NOTE  Nathan [name]  [id number] DOB: [birth year]  DOA: 10/28/2015 PCP: No primary care provider on file.  Outpatient Specialists:  None  Brief Narrative:  45 year old male with no significant past medical history, presented to ED with 1 month history of upper abdominal pain radiating to back, 30 pound weight loss over the last couple of months, decreased appetite. CT abdomen and pelvis suggested pancreatic cancer with metastasis to liver, spleen and both kidneys. Interventional radiology consulted and plan ultrasound-guided biopsy on 5/4. Palliative care consulted for goals of care. CTA chest confirmed bilateral PE. Lower extremity venous Dopplers confirmed bilateral DVT. Started on IV heparin per pharmacy. Oncology consulted. Status post liver biopsy on 5/4. Was supposed to have Port-A-Cath placed as inpatient on 5/5, now rescheduled by IR on 5/8 due to scheduling issues.   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 - Abdominal pain better controlled than admission but only for 2 hours. Adjusted pain medications. Better. - Oncology consultation 5/4 appreciated: Likely malignancy until proven otherwise. Recommend Port-A-Cath placement while hospitalized to expedite treatment in the near future and he will be on long-term oral anticoagulant therapy as outpatient. Outpatient appointment on 11/05/15 to discuss test results. - Palliative care team assisting with pain management. - Status post liver biopsy 5/4. Results pending.  Port-A-Cath placement postponed to 5/8 due to IR scheduling issues. - CA 19-9: [id number]  Acute bilateral pulmonary embolism and bilateral lower extremity DVT's - CT chest with contrast 5/2 was read by radiologist as suspicious for pulmonary embolism. I called and discussed in detail with the reading radiologist who reiterated his strong suspicion for pulmonary embolism and recommended a dose of full dose Lovenox, follow-up CTA chest and then proceed as needed. Patient received a full dose of Lovenox on 5/2 PM - CTA chest 5/3 confirmed bilateral acute PE without right heart strain. Bilateral lower extremity venous Doppler confirms DVT in bilateral lower extremities. - Started IV heparin per pharmacy. As per oncology, after liver biopsy and Port-A-Cath placement, he can be transitioned to oral DOAC (Xarelto or Eliquis). Discussed with case management regarding assistance with medications.  Anemia of chronic disease - Stable. Follow CBCs periodically.  Left ankle and foot pain - No history of trauma. X-rays without acute findings.? Related to DVT versus other musculoskeletal skeletal etiology. Pain management. K pad. Improved.   Constipation - Bowel regimen. No BM yet.   DVT prophylaxis: IV heparin drip. Code Status: Full Family Communication: Discussed with patient's girlfriend at bedside. Disposition Plan: DC home when medically stable, possibly 5/8.   Consultants:   Interventional radiology  Palliative care medicine  Oncology  Procedures:   Bilateral lower extremity venous Dopplers 10/30/15: Vascular Ultrasound Lower extremity venous duplex has been completed. Preliminary findings: DVT noted in the Right peroneal veins, Left posterior tibial veins, and Left peroneal veins. Superficial thrombosis noted in the left small saphenous vein.   Liver biopsy 10/31/15  Antimicrobials:   None    Subjective: Abdominal pain and left foot pain  are better. No BM. No new complaints  reported.  Objective:  Filed Vitals:   10/31/15 1552 10/31/15 1624 10/31/15 2239 11/01/15 0503  BP: 143/89 147/82 137/84 130/88  Pulse: 87 88 99 83  Temp:  98.3 F (36.8 C) 99.7 F (37.6 C) 98.9 F (37.2 C)  TempSrc:  Oral Oral Oral  Resp: 18 18 18 18   Height:      Weight:      SpO2: 95% 98% 94% 95%    Intake/Output Summary (Last 24 hours) at 11/01/15 1712 Last data filed at 11/01/15 1400  Gross per 24 hour  Intake    237 ml  Output      0 ml  Net    237 ml   Filed Weights   10/28/15 2012  Weight: 88.315 kg (194 lb 11.2 oz)    Examination:  General exam: Pleasant young male seen ambulating in the room with mild left lower extremity limp. ENT: Throat exam with minimal patchy erythema near the uvula but no drainage or other acute findings.  Respiratory system: Clear to auscultation. Respiratory effort normal. Cardiovascular system: S1 & S2 heard, RRR.Marland Kitchen No JVD, murmurs, rubs, gallops or clicks. No pedal edema. Gastrointestinal system: Abdomen is nondistended, soft and mild epigastric region tenderness without peritoneal signs . No organomegaly or masses felt. Normal bowel sounds heard. Central nervous system: Alert and oriented. No focal neurological deficits. Extremities: Symmetric 5 x 5 power.Left ankle, mildly swollen, increased warmth but no erythema or skin lesions. Mild tenderness-improved. Good peripheral pulses felt. Skin: No rashes, lesions or ulcers Psychiatry: Judgement and insight appear normal. Mood & affect appropriate.     Data Reviewed: I have personally reviewed following labs and imaging studies  CBC:  Recent Labs Lab 10/28/15 1138 10/28/15 1856 10/29/15 0439 10/31/15 0207 11/01/15 0456  WBC 9.4 11.0* 10.9* 13.6* 11.1*  HGB 12.4* 11.8* 11.4* 10.9* 10.4*  HCT 37.7* 35.7* 34.8* 34.4* 32.4*  MCV 83.0 82.4 83.7 86.4 85.9  PLT 202 189 182 210 [id number]   Basic Metabolic Panel:  Recent Labs Lab 10/28/15 1138 10/28/15 1856 10/29/15 0439  10/30/15 0527 10/31/15 0207  NA 137 137 139 136 134*  K 4.0 3.8 4.3 4.0 3.9  CL 102 102 105 101 99*  CO2 22 23 23 25 23   GLUCOSE 108* 110* 111* 109* 118*  BUN 9 7 8  <5* 6  CREATININE 0.88 0.96 0.99 0.95 0.89  CALCIUM 10.0 9.5 9.3 9.0 8.8*   GFR: Estimated Creatinine Clearance: 113.2 mL/min (by C-G formula based on Cr of 0.89). Liver Function Tests:  Recent Labs Lab 10/28/15 1138 10/31/15 0207  AST 33 36  ALT 36 32  ALKPHOS 146* 155*  BILITOT 0.7 0.8  PROT 7.6 6.3*  ALBUMIN 3.5 2.7*    Recent Labs Lab 10/28/15 1138  LIPASE 26   No results for input(s): AMMONIA in the last 168 hours. Coagulation Profile:  Recent Labs Lab 10/29/15 1105 10/30/15 0527 10/31/15 0207  INR 1.65* 1.40 1.37   Cardiac Enzymes: No results for input(s): CKTOTAL, CKMB, CKMBINDEX, TROPONINI in the last 168 hours. BNP (last 3 results) No results for input(s): PROBNP in the last 8760 hours. HbA1C: No results for input(s): HGBA1C in the last 72 hours. CBG: No results for input(s): GLUCAP in the last 168 hours. Lipid Profile: No results for input(s): CHOL, HDL, LDLCALC, TRIG, CHOLHDL, LDLDIRECT in the last 72 hours. Thyroid Function Tests: No results for input(s): TSH, T4TOTAL, FREET4, T3FREE, THYROIDAB in the last 72 hours. Anemia  Panel: No results for input(s): VITAMINB12, FOLATE, FERRITIN, TIBC, IRON, RETICCTPCT in the last 72 hours. Urine analysis:    Component Value Date/Time   COLORURINE AMBER* 10/28/2015 [place] 10/28/2015 1313   LABSPEC 1.027 10/28/2015 1313   PHURINE 6.0 10/28/2015 1313   GLUCOSEU NEGATIVE 10/28/2015 1313   HGBUR MODERATE* 10/28/2015 1313   BILIRUBINUR SMALL* 10/28/2015 1313   KETONESUR 15* 10/28/2015 1313   PROTEINUR 30* 10/28/2015 1313   NITRITE NEGATIVE 10/28/2015 1313   LEUKOCYTESUR NEGATIVE 10/28/2015 1313        Radiology Studies: US Biopsy  11/01/2015  INDICATION: 45 year old with pancreatic lesion and multiple liver  lesions. Tissue diagnosis is needed. EXAM: ULTRASOUND-GUIDED LIVER LESION BIOPSY MEDICATIONS: None. ANESTHESIA/SEDATION: Moderate (conscious) sedation was employed during this procedure. A total of Versed 4.0 mg and Fentanyl 100 mcg was administered intravenously. Moderate Sedation Time: 23 minutes. The patient's level of consciousness and vital signs were monitored continuously by radiology nursing throughout the procedure under my direct supervision. FLUOROSCOPY TIME:  None COMPLICATIONS: None immediate. PROCEDURE: The procedure was explained to the patient. The risks and benefits of the procedure were discussed and the patient's questions were addressed. Informed consent was obtained from the patient. Liver was evaluated with ultrasound. Patient has known multiple lesions throughout the liver but these are poorly characterized with ultrasound. Most lesions are vague hyperechoic areas. A relatively well-defined nodular lesion in the right hepatic lobe was targeted. The right side of the abdomen was prepped with chlorhexidine and a sterile field was created. The skin and soft tissues were anesthetized with 1% lidocaine. 17 gauge needle directed into the lesion. A total of 3 core biopsies were obtained with an 18 gauge device. Specimens were placed in formalin. Gel-Foam was placed through the 17 gauge needle during removal. Bandage placed over the puncture site. Ultrasound images were taken and saved for this procedure. FINDINGS: Vague hyperechoic areas throughout the liver consistent with known lesions. Relatively well-defined 1 cm lesion in the right hepatic lobe just below the capsule was sampled. No significant bleeding following the core biopsies. Biopsy needle was confirmed within this lesion during the procedure. IMPRESSION: Ultrasound-guided core biopsies of a right hepatic lesion. Electronically Signed   By: Markus Daft M.D.   On: 11/01/2015 08:11        Scheduled Meds: . feeding supplement  1  Container Oral TID BM  . oxyCODONE  15 mg Oral Q12H  . senna-docusate  1 tablet Oral QHS   Continuous Infusions: . sodium chloride 50 mL/hr at 11/01/15 0057  . heparin 1,950 Units/hr (11/01/15 1443)     LOS: 2 days    Time spent: 20 minutes.    [facility], MD [facility] Pager [phone]  If 7PM-7AM, please contact night-coverage [email or web address] Password [email or web address] 11/01/2015, 5:12 PM

## 2015-11-02 DIAGNOSIS — M25572 Pain in left ankle and joints of left foot: Secondary | ICD-10-CM

## 2015-11-02 DIAGNOSIS — K5903 Drug induced constipation: Secondary | ICD-10-CM | POA: Insufficient documentation

## 2015-11-02 DIAGNOSIS — T402X5A Adverse effect of other opioids, initial encounter: Secondary | ICD-10-CM

## 2015-11-02 LAB — CBC
HCT: 31.8 % — ABNORMAL LOW (ref 39.0–52.0)
Hemoglobin: 10.1 g/dL — ABNORMAL LOW (ref 13.0–17.0)
MCH: 27 pg (ref 26.0–34.0)
MCHC: 31.8 g/dL (ref 30.0–36.0)
MCV: 85 fL (ref 78.0–100.0)
PLATELETS: 289 10*3/uL (ref 150–400)
RBC: 3.74 MIL/uL — ABNORMAL LOW (ref 4.22–5.81)
RDW: 14.3 % (ref 11.5–15.5)
WBC: 10.2 10*3/uL (ref 4.0–10.5)

## 2015-11-02 LAB — HEPARIN LEVEL (UNFRACTIONATED)
HEPARIN UNFRACTIONATED: 0.24 [IU]/mL — AB (ref 0.30–0.70)
Heparin Unfractionated: 0.46 IU/mL (ref 0.30–0.70)
Heparin Unfractionated: 0.44 IU/mL (ref 0.30–0.70)

## 2015-11-02 MED ORDER — SENNOSIDES-DOCUSATE SODIUM 8.6-50 MG PO TABS
2.0000 | ORAL_TABLET | Freq: Two times a day (BID) | ORAL | Status: DC
  Administered 2015-11-02 – 2015-11-07 (×10): 2 via ORAL
  Filled 2015-11-02 (×12): qty 2

## 2015-11-02 MED ORDER — OXYCODONE HCL ER 10 MG PO T12A
20.0000 mg | EXTENDED_RELEASE_TABLET | Freq: Two times a day (BID) | ORAL | Status: DC
  Administered 2015-11-02 – 2015-11-04 (×4): 20 mg via ORAL
  Filled 2015-11-02 (×7): qty 2

## 2015-11-02 NOTE — Progress Notes (Addendum)
ANTICOAGULATION CONSULT NOTE   Assessment: 58 YOM continues on IV heparin for bilateral PEs and DVTs while awaiting port-a-cath placement on Monday. HL therapeutic x1 (0.46) on 2300 units/h. Hg low stable, plt wnl. No bleed documented.  Plan: Heparin at 2300 units/h 6h HL to confirm Daily HL/CBC Monitor s/sx bleeding   Elicia Lamp, PharmD, BCPS Clinical Pharmacist Pager [phone] 11/02/2015 3:40 PM  ADDENDUM: Confirmatory HL therapeutic (0.44) on 2300 units/h. No bleed documented. Will follow daily HLs.  Elicia Lamp, PharmD, BCPS Clinical Pharmacist Pager [phone] 11/02/2015 8:49 PM

## 2015-11-02 NOTE — Progress Notes (Signed)
Daily Progress Note   Patient Name: Nathan [name]       Date: 11/02/2015 DOB: May 25, 1971  Age: 45 y.o. MRN#: [id number] Attending Physician: Modena Jansky, MD Primary Care Physician: No primary care provider on file. Admit Date: 10/28/2015  Reason for Consultation/Follow-up: Pain control  Subjective:  awake alert, sitting up in bed, girlfriend at bedside.  Pain in leg, abdomen. Patient on OxyIR 7.5 mg. Patient not asking for IV Morphine PRN, wants to avoid IV opioids if at all possible. Discussed with patient about asking for IV Morphine PRN if pain is more than 6-7/10.  increase OxyContin to 20 mg PO BID, expand bowel regimen, also with anxiety related to this serious illness, has low dose Xanax PRN PO.   Appreciate  Dr Alvy Bimler input and recommendations.  Have asked patient to go ahead and ask for Dulcolax suppository if now bowel movement by this afternoon.  Will have to increase OxyIR to 10 mg PO Q 3 hours by 11-03-15 if pain uncontrolled. I have increased scheduled pain medications today.   Length of Stay: 3  Current Medications: Scheduled Meds:  . feeding supplement  1 Container Oral TID BM  . oxyCODONE  20 mg Oral Q12H  . senna-docusate  2 tablet Oral BID    Continuous Infusions: . sodium chloride 1,000 mL (11/01/15 1856)  . heparin 2,300 Units/hr (11/02/15 0639)    PRN Meds: acetaminophen **OR** acetaminophen, ALPRAZolam, bisacodyl, menthol-cetylpyridinium, morphine injection, ondansetron **OR** ondansetron (ZOFRAN) IV, oxyCODONE, polyethylene glycol  Physical Exam         Awake alert Pain in leg and abdomen S1 S2 Clear Abdomen soft mildly distended No edema Non focal   Vital Signs: BP 140/92 mmHg  Pulse 86  Temp(Src) 98.4 F (36.9 C) (Oral)  Resp 20  Ht 5'  10.8" (1.798 m)  Wt 88.315 kg (194 lb 11.2 oz)  BMI 27.32 kg/m2  SpO2 92% SpO2: SpO2: 92 % O2 Device: O2 Device: Not Delivered O2 Flow Rate:    Intake/output summary:   Intake/Output Summary (Last 24 hours) at 11/02/15 1309 Last data filed at 11/01/15 1856  Gross per 24 hour  Intake 788.37 ml  Output      0 ml  Net 788.37 ml   LBM: Last BM Date: 10/30/15 Baseline Weight: Weight: 88.315 kg (194 lb  11.2 oz) Most recent weight: Weight: 88.315 kg (194 lb 11.2 oz)       Palliative Assessment/Data:    Flowsheet Rows        Most Recent Value   Intake Tab    Referral Department  Hospitalist   Unit at Time of Referral  Oncology Unit   Palliative Care Primary Diagnosis  Cancer   Palliative Care Type  Return patient Palliative Care   Reason for referral  Pain, Non-pain Symptom, Clarify Goals of Care   Date first seen by Palliative Care  10/29/15   Clinical Assessment    Palliative Performance Scale Score  40%   Pain Max last 24 hours  8   Pain Min Last 24 hours  5   Psychosocial & Spiritual Assessment    Palliative Care Outcomes    Patient/Family meeting held?  Yes   Who was at the meeting?  patient, girlfriend, Dr Algis Liming    Palliative Care Outcomes  Clarified goals of care, Improved pain interventions   Palliative Care follow-up planned  Yes, Home   Palliative Care Follow-up Reason  Pain, Clarify goals of care      Patient Active Problem List   Diagnosis Date Noted  . Acute pulmonary embolism ([place]) 10/31/2015  . Acute deep vein thrombosis (DVT) of lower extremity ([place]) 10/31/2015  . Left ankle pain   . 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 Encounter for palliative care   . Goals of care, counseling/discussion   . Upper abdominal pain 10/28/2015  . Weight loss 10/28/2015    Palliative Care Assessment & Plan   Patient Profile:  45 yo with recently diagnosed possible metastatic pancreatic cancer.   Assessment:  Cancer pancreas metastatic to  liver DVT PE Uncontrolled pain Uncontrolled anxiety.   Recommendations/Plan:    OxyContin 20 mg PO BID scheduled  Agree with Oxy IR 7.5 mg PO PRN  Expand bowel regimen. Monitor BMs.   Liver biopsy done, results pending on anti coagulation for recently diagnosed DVT PE    low dose Xanax PO PRN. Discussed with patient.   Still decicing about selecting a HCPOA agent, notified him that chaplains could complete the documentation while he is in the hospital.   Goals of Care and Additional Recommendations:  Limitations on Scope of Treatment: Full Scope Treatment  Code Status:    Code Status Orders        Start     Ordered   10/28/15 1824  Full code   Continuous     10/28/15 1823    Code Status History    Date Active Date Inactive Code Status Order ID Comments User Context   This patient has a current code status but no historical code status.       Prognosis:   Unable to determine  Discharge Planning:  Home with [place] was discussed with  Patient, girlfriend,   Thank you for allowing the Palliative Medicine Team to assist in the care of this patient.   Time In:  1230 Time Out: 1300 Total Time 30 Prolonged Time Billed  no       Greater than 50%  of this time was spent counseling and coordinating care related to the above assessment and plan.  Loistine Chance, MD [phone]  Please contact Palliative Medicine Team phone at [phone] for questions and concerns.

## 2015-11-02 NOTE — Progress Notes (Addendum)
PROGRESS NOTE  Nathan [name]  [id number] DOB: 12-Jun-1971  DOA: 10/28/2015 PCP: No primary care provider on file.  Outpatient Specialists:  None  Brief Narrative:  45 year old male with no significant past medical history, presented to ED with 1 month history of upper abdominal pain radiating to back, 30 pound weight loss over the last couple of months, decreased appetite. CT abdomen and pelvis suggested pancreatic cancer with metastasis to liver, spleen and both kidneys. Interventional radiology consulted and plan ultrasound-guided biopsy on 5/4. Palliative care consulted for goals of care. CTA chest confirmed bilateral PE. Lower extremity venous Dopplers confirmed bilateral DVT. Started on IV heparin per pharmacy. Oncology consulted. Status post liver biopsy on 5/4. Was supposed to have Port-A-Cath placed as inpatient on 5/5, now rescheduled by IR on 5/8 due to scheduling issues.   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 - Abdominal pain better controlled than admission but only for 2 hours. Adjusted pain medications. Better. - Oncology consultation 5/4 appreciated: Likely malignancy until proven otherwise. Recommend Port-A-Cath placement while hospitalized to expedite treatment in the near future and he will be on long-term oral anticoagulant therapy as outpatient. Outpatient appointment on 11/05/15 to discuss test results. - Palliative care team assisting with pain management. - Status post liver biopsy 5/4.  Results pending. Port-A-Cath placement postponed to 5/8 due to IR scheduling issues. - CA 19-9: [id number]  Acute bilateral pulmonary embolism and bilateral lower extremity DVT's - CT chest with contrast 5/2 was read by radiologist as suspicious for pulmonary embolism. I called and discussed in detail with the reading radiologist who reiterated his strong suspicion for pulmonary embolism and recommended a dose of full dose Lovenox, follow-up CTA chest and then proceed as needed. Patient received a full dose of Lovenox on 5/2 PM - CTA chest 5/3 confirmed bilateral acute PE without right heart strain. Bilateral lower extremity venous Doppler confirms DVT in bilateral lower extremities. - Started IV heparin per pharmacy. As per oncology, after liver biopsy and Port-A-Cath placement, he can be transitioned to oral DOAC (Xarelto or Eliquis). Discussed with case management regarding assistance with medications.  Anemia of chronic disease - Stable. Follow CBCs periodically.  Left ankle and foot pain - No history of trauma. X-rays without acute findings.? Related to DVT versus other musculoskeletal skeletal etiology. Pain management. K pad. Improved.   Constipation - Bowel regimen. No BM yet. Palliative team have adjusted meds.   DVT prophylaxis: IV heparin drip. Code Status: Full Family Communication: Discussed with patient's girlfriend at bedside. Disposition Plan: DC home when medically stable, possibly 5/8.   Consultants:   Interventional radiology  Palliative care medicine  Oncology  Procedures:   Bilateral lower extremity venous Dopplers 10/30/15: Vascular Ultrasound Lower extremity venous duplex has been completed. Preliminary findings: DVT noted in the Right peroneal veins, Left posterior tibial veins, and Left peroneal veins. Superficial thrombosis noted in the left small saphenous vein.   Liver biopsy 10/31/15  Antimicrobials:   None  Subjective: Abdominal pain and left  foot pain are better controlled. No BM. Flatus+. No new complaints reported.  Objective:  Filed Vitals:   11/01/15 2132 11/01/15 2236 11/02/15 0651 11/02/15 1419  BP:  141/93 140/92 156/94  Pulse:  89 86 87  Temp: 98.7 F (37.1 C) 98.4 F (36.9 C)  98.6 F (37 C)  TempSrc: Oral     Resp:  18 20 17   Height:      Weight:      SpO2:  98% 92% 98%    Intake/Output Summary (Last 24 hours) at 11/02/15 1533 Last data filed at 11/01/15 1856  Gross per 24 hour  Intake 551.37 ml  Output      0 ml  Net 551.37 ml   Filed Weights   10/28/15 2012  Weight: 88.315 kg (194 lb 11.2 oz)    Examination:  General exam: Pleasant young male seen ambulating in the room with mild left lower extremity limp. ENT: Throat exam with minimal patchy erythema near the uvula but no drainage or other acute findings.  Respiratory system: Clear to auscultation. Respiratory effort normal. Cardiovascular system: S1 & S2 heard, RRR.Marland Kitchen No JVD, murmurs, rubs, gallops or clicks. No pedal edema. Gastrointestinal system: Abdomen is nondistended, soft and mild epigastric region tenderness without peritoneal signs . No organomegaly or masses felt. Normal bowel sounds heard. Central nervous system: Alert and oriented. No focal neurological deficits. Extremities: Symmetric 5 x 5 power.Left ankle, mildly swollen, increased warmth but no erythema or skin lesions. Mild tenderness-improved. Good peripheral pulses felt. Skin: No rashes, lesions or ulcers Psychiatry: Judgement and insight appear normal. Mood & affect appropriate.     Data Reviewed: I have personally reviewed following labs and imaging studies  CBC:  Recent Labs Lab 10/28/15 1856 10/29/15 0439 10/31/15 0207 11/01/15 0456 11/02/15 0355  WBC 11.0* 10.9* 13.6* 11.1* 10.2  HGB 11.8* 11.4* 10.9* 10.4* 10.1*  HCT 35.7* 34.8* 34.4* 32.4* 31.8*  MCV 82.4 83.7 86.4 85.9 85.0  PLT 189 182 210 216 [id number]   Basic Metabolic Panel:  Recent Labs Lab  10/28/15 1138 10/28/15 1856 10/29/15 0439 10/30/15 0527 10/31/15 0207  NA 137 137 139 136 134*  K 4.0 3.8 4.3 4.0 3.9  CL 102 102 105 101 99*  CO2 22 23 23 25 23   GLUCOSE 108* 110* 111* 109* 118*  BUN 9 7 8  <5* 6  CREATININE 0.88 0.96 0.99 0.95 0.89  CALCIUM 10.0 9.5 9.3 9.0 8.8*   GFR: Estimated Creatinine Clearance: 113.2 mL/min (by C-G formula based on Cr of 0.89). Liver Function Tests:  Recent Labs Lab 10/28/15 1138 10/31/15 0207  AST 33 36  ALT 36 32  ALKPHOS 146* 155*  BILITOT 0.7 0.8  PROT 7.6 6.3*  ALBUMIN 3.5 2.7*    Recent Labs Lab 10/28/15 1138  LIPASE 26   No results for input(s): AMMONIA in the last 168 hours. Coagulation Profile:  Recent Labs Lab 10/29/15 1105 10/30/15 0527 10/31/15 0207  INR 1.65* 1.40 1.37   Cardiac Enzymes: No results for input(s): CKTOTAL, CKMB, CKMBINDEX, TROPONINI in the last 168 hours. BNP (last 3 results) No results for input(s): PROBNP in the last 8760 hours. HbA1C: No results for input(s): HGBA1C in the last 72 hours. CBG: No results for input(s): GLUCAP in the last 168 hours. Lipid Profile: No results for input(s): CHOL, HDL, LDLCALC, TRIG, CHOLHDL, LDLDIRECT in the last 72 hours. Thyroid Function Tests: No results for input(s): TSH, T4TOTAL, FREET4, T3FREE, THYROIDAB in the last  72 hours. Anemia Panel: No results for input(s): VITAMINB12, FOLATE, FERRITIN, TIBC, IRON, RETICCTPCT in the last 72 hours. Urine analysis:    Component Value Date/Time   COLORURINE AMBER* 10/28/2015 [place] 10/28/2015 1313   LABSPEC 1.027 10/28/2015 1313   PHURINE 6.0 10/28/2015 1313   GLUCOSEU NEGATIVE 10/28/2015 1313   HGBUR MODERATE* 10/28/2015 1313   BILIRUBINUR SMALL* 10/28/2015 1313   KETONESUR 15* 10/28/2015 1313   PROTEINUR 30* 10/28/2015 1313   NITRITE NEGATIVE 10/28/2015 1313   LEUKOCYTESUR NEGATIVE 10/28/2015 1313        Radiology Studies: US Biopsy  11/01/2015  INDICATION: 45 year old with  pancreatic lesion and multiple liver lesions. Tissue diagnosis is needed. EXAM: ULTRASOUND-GUIDED LIVER LESION BIOPSY MEDICATIONS: None. ANESTHESIA/SEDATION: Moderate (conscious) sedation was employed during this procedure. A total of Versed 4.0 mg and Fentanyl 100 mcg was administered intravenously. Moderate Sedation Time: 23 minutes. The patient's level of consciousness and vital signs were monitored continuously by radiology nursing throughout the procedure under my direct supervision. FLUOROSCOPY TIME:  None COMPLICATIONS: None immediate. PROCEDURE: The procedure was explained to the patient. The risks and benefits of the procedure were discussed and the patient's questions were addressed. Informed consent was obtained from the patient. Liver was evaluated with ultrasound. Patient has known multiple lesions throughout the liver but these are poorly characterized with ultrasound. Most lesions are vague hyperechoic areas. A relatively well-defined nodular lesion in the right hepatic lobe was targeted. The right side of the abdomen was prepped with chlorhexidine and a sterile field was created. The skin and soft tissues were anesthetized with 1% lidocaine. 17 gauge needle directed into the lesion. A total of 3 core biopsies were obtained with an 18 gauge device. Specimens were placed in formalin. Gel-Foam was placed through the 17 gauge needle during removal. Bandage placed over the puncture site. Ultrasound images were taken and saved for this procedure. FINDINGS: Vague hyperechoic areas throughout the liver consistent with known lesions. Relatively well-defined 1 cm lesion in the right hepatic lobe just below the capsule was sampled. No significant bleeding following the core biopsies. Biopsy needle was confirmed within this lesion during the procedure. IMPRESSION: Ultrasound-guided core biopsies of a right hepatic lesion. Electronically Signed   By: Markus Daft M.D.   On: 11/01/2015 08:11        Scheduled  Meds: . feeding supplement  1 Container Oral TID BM  . oxyCODONE  20 mg Oral Q12H  . senna-docusate  2 tablet Oral BID   Continuous Infusions: . sodium chloride 50 mL/hr at 11/02/15 1420  . heparin 2,300 Units/hr (11/02/15 1421)     LOS: 3 days    Time spent: 20 minutes.    [facility], MD [facility] Pager [phone]  If 7PM-7AM, please contact night-coverage [email or web address] Password [facility] [address] 11/02/2015, 3:33 PM

## 2015-11-02 NOTE — Progress Notes (Signed)
ANTICOAGULATION CONSULT NOTE   Assessment: 64 YOM continues on IV heparin for bilateral PEs and DVTs.  Heparin level is 0.24 this am on heparin infusion of 2050 units/hr. CBC stable.   Plan: - Increase heparin gtt to 2300 units/hr - Hep level early afternoon    Vincenza Hews, PharmD, BCPS 11/02/2015, 6:41 AM Pager: [phone]

## 2015-11-02 NOTE — Plan of Care (Signed)
Problem: Bowel/Gastric: Goal: Will not experience complications related to bowel motility Outcome: Progressing Patient given miralax and suppository. Had a small bowel movement.

## 2015-11-03 ENCOUNTER — Inpatient Hospital Stay (HOSPITAL_COMMUNITY): Payer: Medicaid Other

## 2015-11-03 LAB — CBC
HCT: 31.4 % — ABNORMAL LOW (ref 39.0–52.0)
Hemoglobin: 10 g/dL — ABNORMAL LOW (ref 13.0–17.0)
MCH: 26.6 pg (ref 26.0–34.0)
MCHC: 31.8 g/dL (ref 30.0–36.0)
MCV: 83.5 fL (ref 78.0–100.0)
PLATELETS: 329 10*3/uL (ref 150–400)
RBC: 3.76 MIL/uL — ABNORMAL LOW (ref 4.22–5.81)
RDW: 14.3 % (ref 11.5–15.5)
WBC: 10.2 10*3/uL (ref 4.0–10.5)

## 2015-11-03 LAB — HEPARIN LEVEL (UNFRACTIONATED): Heparin Unfractionated: 0.49 IU/mL (ref 0.30–0.70)

## 2015-11-03 MED ORDER — MILK AND MOLASSES ENEMA
1.0000 | Freq: Once | RECTAL | Status: AC
  Administered 2015-11-03: 250 mL via RECTAL
  Filled 2015-11-03: qty 250

## 2015-11-03 MED ORDER — BISACODYL 10 MG RE SUPP
10.0000 mg | Freq: Every day | RECTAL | Status: AC
Start: 2015-11-03 — End: 2015-11-03
  Administered 2015-11-03: 10 mg via RECTAL
  Filled 2015-11-03: qty 1

## 2015-11-03 NOTE — Progress Notes (Addendum)
PROGRESS NOTE  Nathan [name]  [id number] DOB: Oct 30, 1970  DOA: 10/28/2015 PCP: No primary care provider on file.  Outpatient Specialists:  None  Brief Narrative:  45 year old male with no significant past medical history, presented to ED with 1 month history of upper abdominal pain radiating to back, 30 pound weight loss over the last couple of months, decreased appetite. CT abdomen and pelvis suggested pancreatic cancer with metastasis to liver, spleen and both kidneys. Interventional radiology consulted and plan ultrasound-guided biopsy on 5/4. Palliative care consulted for goals of care. CTA chest confirmed bilateral PE. Lower extremity venous Dopplers confirmed bilateral DVT. Started on IV heparin per pharmacy. Oncology consulted. Status post liver biopsy on 5/4. Was supposed to have Port-A-Cath placed as inpatient on 5/5, now rescheduled by IR on 5/8 due to scheduling issues.   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 - Abdominal pain better controlled than admission but only for 2 hours. Adjusted pain medications. Better. - Oncology consultation 5/4 appreciated: Likely malignancy until proven otherwise. Recommend Port-A-Cath placement while hospitalized to expedite treatment in the near future and he will be on long-term oral anticoagulant therapy as outpatient. Outpatient appointment on 11/05/15 to discuss test results. - Palliative care team assisting with pain management. - Status post liver biopsy 5/4.  Results pending. Port-A-Cath placement postponed to 5/8 due to IR scheduling issues. - CA 19-9: [id number]  Acute bilateral pulmonary embolism and bilateral lower extremity DVT's - CT chest with contrast 5/2 was read by radiologist as suspicious for pulmonary embolism. I called and discussed in detail with the reading radiologist who reiterated his strong suspicion for pulmonary embolism and recommended a dose of full dose Lovenox, follow-up CTA chest and then proceed as needed. Patient received a full dose of Lovenox on 5/2 PM - CTA chest 5/3 confirmed bilateral acute PE without right heart strain. Bilateral lower extremity venous Doppler confirms DVT in bilateral lower extremities. - Started IV heparin per pharmacy. As per oncology, after liver biopsy and Port-A-Cath placement, he can be transitioned to oral DOAC (Xarelto or Eliquis). Discussed with case management regarding assistance with medications.  Anemia of chronic disease - Stable. Follow CBCs periodically.  Left ankle and foot pain - No history of trauma. X-rays without acute findings.? Related to DVT versus other musculoskeletal skeletal etiology. Pain management. K pad. Improved.   Constipation - Palliative team have adjusted meds. Had a very small BM after Dulcolax suppository on 5/6. Feels bloated this morning. KUB shows moderate stool without distention. Trial of enema 1. Ambulate. Has been eating well. Has also tried prune juice.   DVT prophylaxis: IV heparin drip. Code Status: Full Family Communication: Discussed with patient's girlfriend at bedside. Disposition Plan: DC home when medically stable, possibly 5/8.   Consultants:   Interventional radiology  Palliative care medicine  Oncology  Procedures:   Bilateral lower extremity venous Dopplers 10/30/15: Vascular Ultrasound Lower extremity venous duplex has been completed. Preliminary findings: DVT noted in the Right peroneal veins,  Left posterior tibial veins, and  Left peroneal veins. Superficial thrombosis noted in the left small saphenous vein.   Liver biopsy 10/31/15  Antimicrobials:   None    Subjective: Eating well. Flatus +. Small BM after suppository on 5/6. Abdomen feels distended without worsening pain. No nausea or vomiting. Left ankle/foot pain continues to improve.  Objective:  Filed Vitals:   11/02/15 1419 11/02/15 2134 11/03/15 0033 11/03/15 0546  BP: 156/94 149/97  144/91  Pulse: 87 93  86  Temp: 98.6 F (37 C) 99.9 F (37.7 C) 98.9 F (37.2 C) 98.2 F (36.8 C)  TempSrc:  Oral Oral   Resp: 17 16  20   Height:      Weight:      SpO2: 98% 99%  95%    Intake/Output Summary (Last 24 hours) at 11/03/15 1500 Last data filed at 11/03/15 0640  Gross per 24 hour  Intake 544.34 ml  Output      1 ml  Net 543.34 ml   Filed Weights   10/28/15 2012  Weight: 88.315 kg (194 lb 11.2 oz)    Examination:  General exam: Pleasant young male seen ambulating in the room with mild left lower extremity limp. ENT: Throat exam with minimal patchy erythema near the uvula but no drainage or other acute findings.  Respiratory system: Clear to auscultation. Respiratory effort normal. Cardiovascular system: S1 & S2 heard, RRR.Marland Kitchen No JVD, murmurs, rubs, gallops or clicks. No pedal edema. Gastrointestinal system: Abdomen is mildly distended, soft and nontender. No organomegaly or masses felt. Normal bowel sounds heard. No acute findings at site of liver biopsy. Central nervous system: Alert and oriented. No focal neurological deficits. Extremities: Symmetric 5 x 5 power.Left ankle, mildly swollen, increased warmth but no erythema or skin lesions. Mild tenderness-improved. Good peripheral pulses felt. Skin: No rashes, lesions or ulcers Psychiatry: Judgement and insight appear normal. Mood & affect appropriate.     Data Reviewed: I have personally reviewed following labs and imaging studies  CBC:  Recent Labs Lab 10/29/15 0439  10/31/15 0207 11/01/15 0456 11/02/15 0355 11/03/15 0614  WBC 10.9* 13.6* 11.1* 10.2 10.2  HGB 11.4* 10.9* 10.4* 10.1* 10.0*  HCT 34.8* 34.4* 32.4* 31.8* 31.4*  MCV 83.7 86.4 85.9 85.0 83.5  PLT 182 210 216 289 [id number]   Basic Metabolic Panel:  Recent Labs Lab 10/28/15 1138 10/28/15 1856 10/29/15 0439 10/30/15 0527 10/31/15 0207  NA 137 137 139 136 134*  K 4.0 3.8 4.3 4.0 3.9  CL 102 102 105 101 99*  CO2 22 23 23 25 23   GLUCOSE 108* 110* 111* 109* 118*  BUN 9 7 8  <5* 6  CREATININE 0.88 0.96 0.99 0.95 0.89  CALCIUM 10.0 9.5 9.3 9.0 8.8*   GFR: Estimated Creatinine Clearance: 113.2 mL/min (by C-G formula based on Cr of 0.89). Liver Function Tests:  Recent Labs Lab 10/28/15 1138 10/31/15 0207  AST 33 36  ALT 36 32  ALKPHOS 146* 155*  BILITOT 0.7 0.8  PROT 7.6 6.3*  ALBUMIN 3.5 2.7*    Recent Labs Lab 10/28/15 1138  LIPASE 26   No results for input(s): AMMONIA in the last 168 hours. Coagulation Profile:  Recent Labs Lab 10/29/15 1105 10/30/15 0527 10/31/15 0207  INR 1.65* 1.40 1.37   Cardiac Enzymes: No results for input(s): CKTOTAL, CKMB, CKMBINDEX, TROPONINI in the last 168 hours. BNP (last 3 results) No results for input(s): PROBNP in the last 8760 hours. HbA1C: No results for input(s): HGBA1C in the last  72 hours. CBG: No results for input(s): GLUCAP in the last 168 hours. Lipid Profile: No results for input(s): CHOL, HDL, LDLCALC, TRIG, CHOLHDL, LDLDIRECT in the last 72 hours. Thyroid Function Tests: No results for input(s): TSH, T4TOTAL, FREET4, T3FREE, THYROIDAB in the last 72 hours. Anemia Panel: No results for input(s): VITAMINB12, FOLATE, FERRITIN, TIBC, IRON, RETICCTPCT in the last 72 hours. Urine analysis:    Component Value Date/Time   COLORURINE AMBER* 10/28/2015 [place] 10/28/2015 1313   LABSPEC 1.027 10/28/2015 1313   PHURINE 6.0 10/28/2015 1313   GLUCOSEU NEGATIVE 10/28/2015 1313   HGBUR MODERATE* 10/28/2015  1313   BILIRUBINUR SMALL* 10/28/2015 1313   KETONESUR 15* 10/28/2015 1313   PROTEINUR 30* 10/28/2015 1313   NITRITE NEGATIVE 10/28/2015 1313   LEUKOCYTESUR NEGATIVE 10/28/2015 1313        Radiology Studies: Dg Abd Portable 1v  11/03/2015  CLINICAL DATA:  Right upper quadrant pain and constipation. Pancreatic carcinoma. EXAM: PORTABLE ABDOMEN - 1 VIEW COMPARISON:  CT abdomen and pelvis Oct 28, 2015 FINDINGS: There is moderate stool throughout the colon. There is no bowel dilatation or air-fluid level suggesting obstruction. No free air. Small phlebolith in left pelvis. IMPRESSION: No bowel obstruction or free air. Moderate stool throughout colon. Colon does not appear distended with stool. Electronically Signed   By: Lowella Grip III M.D.   On: 11/03/2015 10:13        Scheduled Meds: . feeding supplement  1 Container Oral TID BM  . oxyCODONE  20 mg Oral Q12H  . senna-docusate  2 tablet Oral BID   Continuous Infusions: . heparin 2,300 Units/hr (11/03/15 0034)     LOS: 4 days    Time spent: 20 minutes.    [facility], MD [facility] Pager [phone]  If 7PM-7AM, please contact night-coverage [email or web address] Password [email or web address] 11/03/2015, 3:00 PM

## 2015-11-03 NOTE — Progress Notes (Signed)
Enema given, patient only able to hold in 5 minutes.

## 2015-11-03 NOTE — Plan of Care (Signed)
Problem: Bowel/Gastric: Goal: Will not experience complications related to bowel motility Outcome: Progressing Patient had a small bowel movement today. Hard, formed stool.

## 2015-11-03 NOTE — Progress Notes (Signed)
ANTICOAGULATION CONSULT NOTE   Assessment: 78 YOM continues on IV heparin for bilateral PEs and DVTs while awaiting port-a-cath placement on Monday. HL therapeutic (0.49) on 2300 units/h. Hg low stable, plt wnl. No bleed documented.  Plan: Heparin at 2300 units/h Daily HL/CBC Monitor s/sx bleeding   Elicia Lamp, PharmD, [facility] Clinical Pharmacist Pager [phone] 11/03/2015 10:27 AM

## 2015-11-04 ENCOUNTER — Inpatient Hospital Stay (HOSPITAL_COMMUNITY): Payer: Medicaid Other

## 2015-11-04 ENCOUNTER — Inpatient Hospital Stay: Payer: Self-pay | Admitting: Hematology and Oncology

## 2015-11-04 ENCOUNTER — Ambulatory Visit (HOSPITAL_COMMUNITY): Payer: Self-pay

## 2015-11-04 ENCOUNTER — Other Ambulatory Visit (HOSPITAL_COMMUNITY): Payer: Self-pay

## 2015-11-04 ENCOUNTER — Telehealth: Payer: Self-pay | Admitting: *Deleted

## 2015-11-04 DIAGNOSIS — K5901 Slow transit constipation: Secondary | ICD-10-CM

## 2015-11-04 LAB — CBC
HEMATOCRIT: 32.4 % — AB (ref 39.0–52.0)
Hemoglobin: 10.6 g/dL — ABNORMAL LOW (ref 13.0–17.0)
MCH: 27.7 pg (ref 26.0–34.0)
MCHC: 32.7 g/dL (ref 30.0–36.0)
MCV: 84.8 fL (ref 78.0–100.0)
Platelets: 357 10*3/uL (ref 150–400)
RBC: 3.82 MIL/uL — ABNORMAL LOW (ref 4.22–5.81)
RDW: 14.3 % (ref 11.5–15.5)
WBC: 11.7 10*3/uL — ABNORMAL HIGH (ref 4.0–10.5)

## 2015-11-04 LAB — HEPARIN LEVEL (UNFRACTIONATED): Heparin Unfractionated: 0.42 IU/mL (ref 0.30–0.70)

## 2015-11-04 MED ORDER — CEFAZOLIN SODIUM-DEXTROSE 2-4 GM/100ML-% IV SOLN
INTRAVENOUS | Status: AC
  Administered 2015-11-04: 2000 mg
  Filled 2015-11-04: qty 100

## 2015-11-04 MED ORDER — CEFAZOLIN SODIUM-DEXTROSE 2-4 GM/100ML-% IV SOLN
2.0000 g | INTRAVENOUS | Status: AC
  Filled 2015-11-04: qty 100

## 2015-11-04 MED ORDER — FLEET ENEMA 7-19 GM/118ML RE ENEM
1.0000 | ENEMA | Freq: Once | RECTAL | Status: AC
  Administered 2015-11-04: 1 via RECTAL
  Filled 2015-11-04: qty 1

## 2015-11-04 MED ORDER — METHYLNALTREXONE BROMIDE 12 MG/0.6ML ~~LOC~~ SOLN
12.0000 mg | Freq: Once | SUBCUTANEOUS | Status: AC
  Administered 2015-11-04: 12 mg via SUBCUTANEOUS
  Filled 2015-11-04: qty 0.6

## 2015-11-04 MED ORDER — BISACODYL 10 MG RE SUPP
10.0000 mg | Freq: Every day | RECTAL | Status: DC | PRN
Start: 2015-11-04 — End: 2015-11-06

## 2015-11-04 MED ORDER — LIDOCAINE-EPINEPHRINE (PF) 1 %-1:200000 IJ SOLN
INTRAMUSCULAR | Status: AC
  Administered 2015-11-04: 10 mL
  Filled 2015-11-04: qty 30

## 2015-11-04 MED ORDER — HEPARIN SOD (PORK) LOCK FLUSH 100 UNIT/ML IV SOLN
INTRAVENOUS | Status: AC
  Administered 2015-11-04: 12:00:00
  Filled 2015-11-04: qty 5

## 2015-11-04 MED ORDER — MIDAZOLAM HCL 2 MG/2ML IJ SOLN
INTRAMUSCULAR | Status: AC
  Filled 2015-11-04: qty 4

## 2015-11-04 MED ORDER — FENTANYL CITRATE (PF) 100 MCG/2ML IJ SOLN
INTRAMUSCULAR | Status: AC | PRN
  Administered 2015-11-04: 50 ug via INTRAVENOUS
  Administered 2015-11-04: 25 ug via INTRAVENOUS

## 2015-11-04 MED ORDER — FENTANYL CITRATE (PF) 100 MCG/2ML IJ SOLN
INTRAMUSCULAR | Status: AC
  Filled 2015-11-04: qty 4

## 2015-11-04 MED ORDER — MIDAZOLAM HCL 2 MG/2ML IJ SOLN
INTRAMUSCULAR | Status: AC | PRN
  Administered 2015-11-04: 0.5 mg via INTRAVENOUS
  Administered 2015-11-04: 1 mg via INTRAVENOUS

## 2015-11-04 MED ORDER — HEPARIN (PORCINE) IN NACL 100-0.45 UNIT/ML-% IJ SOLN
2300.0000 [IU]/h | INTRAMUSCULAR | Status: DC
  Administered 2015-11-04 – 2015-11-05 (×2): 2300 [IU]/h via INTRAVENOUS
  Filled 2015-11-04 (×2): qty 250

## 2015-11-04 MED ORDER — SODIUM CHLORIDE 0.9 % IV SOLN
INTRAVENOUS | Status: AC | PRN
  Administered 2015-11-04: 10 mL/h via INTRAVENOUS

## 2015-11-04 MED ORDER — POLYETHYLENE GLYCOL 3350 17 G PO PACK
17.0000 g | PACK | Freq: Two times a day (BID) | ORAL | Status: DC
  Administered 2015-11-04 – 2015-11-05 (×4): 17 g via ORAL
  Filled 2015-11-04 (×4): qty 1

## 2015-11-04 NOTE — Progress Notes (Signed)
Daily Progress Note   Patient Name: Nathan [name]       Date: 11/04/2015 DOB: Jan 19, 1971  Age: 45 y.o. MRN#: [id number] Attending Physician: Modena Jansky, MD Primary Care Physician: No primary care provider on file. Admit Date: 10/28/2015  Reason for Consultation/Follow-up: Pain control  Subjective:  awake alert, lying in bed, girlfriend at bedside. In pain.  Pain in leg, abdomen. Patient on OxyIR 7.5 mg. Patient not asking for IV Morphine PRN, wants to avoid IV opioids if at all possible.   Increase OxyContin to 20 mg PO BID,  Bowel regimen changes made today: agree with Fleets enema, may have Relistor if no results with the fleets enema,  To have port a cath placed today.   Length of Stay: 5  Current Medications: Scheduled Meds:  . [START ON 11/05/2015]  ceFAZolin (ANCEF) IV  2 g Intravenous To SS-Surg  . feeding supplement  1 Container Oral TID BM  . methylnaltrexone  12 mg Subcutaneous Once  . oxyCODONE  20 mg Oral Q12H  . polyethylene glycol  17 g Oral BID  . senna-docusate  2 tablet Oral BID  . sodium phosphate  1 enema Rectal Once    Continuous Infusions: . heparin Stopped (11/04/15 0645)    PRN Meds: acetaminophen **OR** acetaminophen, ALPRAZolam, menthol-cetylpyridinium, morphine injection, ondansetron **OR** ondansetron (ZOFRAN) IV, oxyCODONE  Physical Exam         Awake alert Pain in leg and abdomen S1 S2 Clear Abdomen soft mildly distended No edema Non focal   Vital Signs: BP 136/95 mmHg  Pulse 84  Temp(Src) 98 F (36.7 C) (Oral)  Resp 16  Ht 5' 10.8" (1.798 m)  Wt 88.315 kg (194 lb 11.2 oz)  BMI 27.32 kg/m2  SpO2 90% SpO2: SpO2: 90 % O2 Device: O2 Device: Not Delivered O2 Flow Rate:    Intake/output summary:   Intake/Output Summary (Last 24  hours) at 11/04/15 0947 Last data filed at 11/04/15 0721  Gross per 24 hour  Intake 567.72 ml  Output      0 ml  Net 567.72 ml   LBM: Last BM Date: 11/03/15 Baseline Weight: Weight: 88.315 kg (194 lb 11.2 oz) Most recent weight: Weight: 88.315 kg (194 lb 11.2 oz)       Palliative Assessment/Data:    Flowsheet Rows  Most Recent Value   Intake Tab    Referral Department  Hospitalist   Unit at Time of Referral  Oncology Unit   Palliative Care Primary Diagnosis  Cancer   Palliative Care Type  Return patient Palliative Care   Reason for referral  Pain, Non-pain Symptom, Clarify Goals of Care   Date first seen by Palliative Care  10/29/15   Clinical Assessment    Palliative Performance Scale Score  40%   Pain Max last 24 hours  8   Pain Min Last 24 hours  5   Psychosocial & Spiritual Assessment    Palliative Care Outcomes    Patient/Family meeting held?  Yes   Who was at the meeting?  patient, girlfriend, Dr Algis Liming    Palliative Care Outcomes  Clarified goals of care, Improved pain interventions   Palliative Care follow-up planned  Yes, Home   Palliative Care Follow-up Reason  Pain, Clarify goals of care      Patient Active Problem List   Diagnosis Date Noted  . Constipation   . Acute pulmonary embolism ([place]) 10/31/2015  . Acute deep vein thrombosis (DVT) of lower extremity ([place]) 10/31/2015  . Left ankle pain   . 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 Encounter for palliative care   . Goals of care, counseling/discussion   . Upper abdominal pain 10/28/2015  . Weight loss 10/28/2015    Palliative Care Assessment & Plan   Patient Profile:  45 yo with recently diagnosed possible metastatic pancreatic cancer.   Assessment:  Cancer pancreas metastatic to liver DVT PE Uncontrolled pain Uncontrolled anxiety.   Recommendations/Plan:    OxyContin 20 mg PO BID scheduled  Agree with Oxy IR 7.5 mg PO PRN  Expand bowel regimen. Monitor BMs.    Liver biopsy done,   on anti coagulation for recently diagnosed DVT PE    low dose Xanax PO PRN. Discussed with patient.   Still decicing about selecting a HCPOA agent, notified him that chaplains could complete the documentation while he is in the hospital.   Goals of Care and Additional Recommendations:  Limitations on Scope of Treatment: Full Scope Treatment  Code Status:    Code Status Orders        Start     Ordered   10/28/15 1824  Full code   Continuous     10/28/15 1823    Code Status History    Date Active Date Inactive Code Status Order ID Comments User Context   This patient has a current code status but no historical code status.       Prognosis:   Unable to determine  Discharge Planning:  Home with [place] was discussed with  Patient, girlfriend,   Thank you for allowing the Palliative Medicine Team to assist in the care of this patient.   Time In: 9 Time Out: 925 Total Time 25 Prolonged Time Billed  no       Greater than 50%  of this time was spent counseling and coordinating care related to the above assessment and plan.  Loistine Chance, MD [phone]  Please contact Palliative Medicine Team phone at [phone] for questions and concerns.

## 2015-11-04 NOTE — Sedation Documentation (Signed)
Patient is resting comfortably. 

## 2015-11-04 NOTE — Telephone Encounter (Signed)
Per staff message and POF I have scheduled appts. Advised scheduler of appts. JMW  

## 2015-11-04 NOTE — Procedures (Signed)
Successful RT IJ POWER PORT TIP svc/ra No comp Stable Ready for use Full report in PACS

## 2015-11-04 NOTE — Sedation Documentation (Signed)
Patient denies pain and is resting comfortably.  

## 2015-11-04 NOTE — Progress Notes (Signed)
PROGRESS NOTE  Nathan [name]  [id number] DOB: July 12, 1970  DOA: 10/28/2015 PCP: No primary care provider on file.  Outpatient Specialists:  None  Brief Narrative:  45 year old male with no significant past medical history, presented to ED with 1 month history of upper abdominal pain radiating to back, 30 pound weight loss over the last couple of months, decreased appetite. CT abdomen and pelvis suggested pancreatic cancer with metastasis to liver, spleen and both kidneys. Interventional radiology consulted and plan ultrasound-guided biopsy on 5/4. Palliative care consulted for goals of care. CTA chest confirmed bilateral PE. Lower extremity venous Dopplers confirmed bilateral DVT. Started on IV heparin per pharmacy. Oncology consulted. Status post liver biopsy on 5/4. S/P Port-A-Cath on 5/8. As per IR, start Xarelto 5/9. Possible discharge home 5/9.   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 Dr. Tillman Sers, Pathology called to inform that pathology is consistent with metastatic adenocarcinoma, most likely pancreaticobiliary primary. - Palliative care assisted with pain management which is reasonably controlled. - s/p Port-A-Cath placement by IR on 5/8. - Hopefully can discharge home on 5/9 to start chemotherapy ASAP. Oncology has consulted and closely following.  Acute bilateral pulmonary embolism and bilateral lower extremity DVT's - CTA chest 5/3 confirmed bilateral acute PE without right heart strain. Bilateral lower extremity venous Doppler confirms DVT in bilateral lower extremities. - Started IV  heparin per pharmacy. Discussed with IR after he got his Port-A-Cath today and they recommended resuming IV heparin after 4 PM today and starting Xarelto on 5/9.  Anemia of chronic disease - Stable. Follow CBCs periodically.  Left ankle and foot pain - No history of trauma. X-rays without acute findings.? Related to DVT versus other musculoskeletal skeletal etiology. Pain management. K pad. Improved.   Constipation - Palliative team have adjusted meds. Ambulate. Has been eating well. Has also tried prune juice. - Had small hard BM after enema and Dulcolax suppository on 5/7. Trial of Fleet enema today. Palliative team has started Relistor if no results with enema.  Elevated blood pressure - Secondary to pain issues. Not on blood pressure medications.   DVT prophylaxis: IV heparin drip. Code Status: Full Family Communication: Discussed with patient's girlfriend at bedside. Disposition Plan: DC home when medically stable, possibly 5/9.   Consultants:   Interventional radiology  Palliative care medicine  Oncology  Procedures:   Bilateral lower extremity venous Dopplers 10/30/15: Vascular Ultrasound Lower extremity venous duplex has been completed. Preliminary findings: DVT noted in the Right peroneal veins, Left posterior tibial veins, and Left peroneal veins. Superficial thrombosis noted in the left small saphenous vein.   Liver biopsy 10/31/15  Port-A-Cath placed 5/8 by IR.  Antimicrobials:   None    Subjective: Seen this morning prior to Port-A-Cath placement. Small hard BM after enema and Dulcolax suppository on 5/7. Past a lot of flatus overnight. Abdomen feels less distended. Pain controlled.  Objective:  Filed Vitals:   11/04/15 1215 11/04/15 1220 11/04/15 1239 11/04/15 1301  BP: 143/105 151/111 154/110 146/95  Pulse: 98 97 98 94  Temp:    98.2 F (36.8  C)  TempSrc:      Resp: 26 28 26 24   Height:      Weight:      SpO2: 99% 99% 98% 94%     Intake/Output Summary (Last 24 hours) at 11/04/15 1419 Last data filed at 11/04/15 [id number]  Gross per 24 hour  Intake 567.72 ml  Output      0 ml  Net 567.72 ml   Filed Weights   10/28/15 2012  Weight: 88.315 kg (194 lb 11.2 oz)    Examination:  General exam: Pleasant young male seen ambulating in the room. Respiratory system: Clear to auscultation. Respiratory effort normal. Cardiovascular system: S1 & S2 heard, RRR.Marland Kitchen No JVD, murmurs, rubs, gallops or clicks. No pedal edema. Gastrointestinal system: Abdomen is non distended, soft and nontender. No organomegaly or masses felt. Normal bowel sounds heard. No acute findings at site of liver biopsy. Central nervous system: Alert and oriented. No focal neurological deficits. Extremities: Symmetric 5 x 5 power.Left ankle and foot minimally swollen but significantly improved compared to last week. Good peripheral pulses felt. Skin: No rashes, lesions or ulcers Psychiatry: Judgement and insight appear normal. Mood & affect appropriate.     Data Reviewed: I have personally reviewed following labs and imaging studies  CBC:  Recent Labs Lab 10/31/15 0207 11/01/15 0456 11/02/15 0355 11/03/15 0614 11/04/15 0532  WBC 13.6* 11.1* 10.2 10.2 11.7*  HGB 10.9* 10.4* 10.1* 10.0* 10.6*  HCT 34.4* 32.4* 31.8* 31.4* 32.4*  MCV 86.4 85.9 85.0 83.5 84.8  PLT 210 216 289 329 [id number]   Basic Metabolic Panel:  Recent Labs Lab 10/28/15 1856 10/29/15 0439 10/30/15 0527 10/31/15 0207  NA 137 139 136 134*  K 3.8 4.3 4.0 3.9  CL 102 105 101 99*  CO2 23 23 25 23   GLUCOSE 110* 111* 109* 118*  BUN 7 8 <5* 6  CREATININE 0.96 0.99 0.95 0.89  CALCIUM 9.5 9.3 9.0 8.8*   GFR: Estimated Creatinine Clearance: 113.2 mL/min (by C-G formula based on Cr of 0.89). Liver Function Tests:  Recent Labs Lab 10/31/15 0207  AST 36  ALT 32  ALKPHOS 155*  BILITOT 0.8  PROT 6.3*  ALBUMIN 2.7*   No results for input(s): LIPASE, AMYLASE in the last 168  hours. No results for input(s): AMMONIA in the last 168 hours. Coagulation Profile:  Recent Labs Lab 10/29/15 1105 10/30/15 0527 10/31/15 0207  INR 1.65* 1.40 1.37   Cardiac Enzymes: No results for input(s): CKTOTAL, CKMB, CKMBINDEX, TROPONINI in the last 168 hours. BNP (last 3 results) No results for input(s): PROBNP in the last 8760 hours. HbA1C: No results for input(s): HGBA1C in the last 72 hours. CBG: No results for input(s): GLUCAP in the last 168 hours. Lipid Profile: No results for input(s): CHOL, HDL, LDLCALC, TRIG, CHOLHDL, LDLDIRECT in the last 72 hours. Thyroid Function Tests: No results for input(s): TSH, T4TOTAL, FREET4, T3FREE, THYROIDAB in the last 72 hours. Anemia Panel: No results for input(s): VITAMINB12, FOLATE, FERRITIN, TIBC, IRON, RETICCTPCT in the last 72 hours. Urine analysis:    Component Value Date/Time   COLORURINE AMBER* 10/28/2015 [place] 10/28/2015 1313   LABSPEC 1.027 10/28/2015 1313   PHURINE 6.0 10/28/2015 1313   GLUCOSEU NEGATIVE 10/28/2015 1313   HGBUR MODERATE* 10/28/2015 1313   BILIRUBINUR SMALL* 10/28/2015 1313   KETONESUR 15* 10/28/2015 1313   PROTEINUR 30* 10/28/2015 1313   NITRITE NEGATIVE 10/28/2015 1313   LEUKOCYTESUR NEGATIVE 10/28/2015 1313  Radiology Studies: Ir Fluoro Guide Cv Line Right  11/04/2015  CLINICAL DATA:  Metastatic pancreas cancer EXAM: RIGHT INTERNAL JUGULAR SINGLE LUMEN POWER PORT CATHETER INSERTION Date:  5/8/20175/01/2016 12:29 pm Radiologist:  M. Daryll Brod, MD Guidance:  Ultrasound and fluoroscopic MEDICATIONS: 2 g Ancef; The antibiotic was administered within an appropriate time interval prior to skin puncture. ANESTHESIA/SEDATION: Versed 1.5 mg IV; Fentanyl 75 mcg IV; Moderate Sedation Time:  35 minutes The patient was continuously monitored during the procedure by the interventional radiology nurse under my direct supervision. FLUOROSCOPY TIME:  30 seconds (2 mGy) COMPLICATIONS:  None immediate. CONTRAST:  None. PROCEDURE: Informed consent was obtained from the patient following explanation of the procedure, risks, benefits and alternatives. The patient understands, agrees and consents for the procedure. All questions were addressed. A time out was performed. Maximal barrier sterile technique utilized including caps, mask, sterile gowns, sterile gloves, large sterile drape, hand hygiene, and 2% chlorhexidine scrub. Under sterile conditions and local anesthesia, right internal jugular micropuncture venous access was performed. Access was performed with ultrasound. Images were obtained for documentation of the patent right internal jugular vein. A guide wire was inserted followed by a transitional dilator. This allowed insertion of a guide wire and catheter into the IVC. Measurements were obtained from the SVC / RA junction back to the right IJ venotomy site. In the right infraclavicular chest, a subcutaneous pocket was created over the second anterior rib. This was done under sterile conditions and local anesthesia. 1% lidocaine with epinephrine was utilized for this. A 2.5 cm incision was made in the skin. Blunt dissection was performed to create a subcutaneous pocket over the right pectoralis major muscle. The pocket was flushed with saline vigorously. There was adequate hemostasis. The port catheter was assembled and checked for leakage. The port catheter was secured in the pocket with two retention sutures. The tubing was tunneled subcutaneously to the right venotomy site and inserted into the SVC/RA junction through a valved peel-away sheath. Position was confirmed with fluoroscopy. Images were obtained for documentation. The patient tolerated the procedure well. No immediate complications. Incisions were closed in a two layer fashion with 4 - 0 Vicryl suture. Dermabond was applied to the skin. The port catheter was accessed, blood was aspirated followed by saline and heparin flushes.  Needle was removed. A dry sterile dressing was applied. IMPRESSION: Ultrasound and fluoroscopically guided right internal jugular single lumen power port catheter insertion. Tip in the SVC/RA junction. Catheter ready for use. Electronically Signed   By: Jerilynn Mages.  Shick M.D.   On: 11/04/2015 12:55   Ir US Guide Vasc Access Right  11/04/2015  CLINICAL DATA:  Metastatic pancreas cancer EXAM: RIGHT INTERNAL JUGULAR SINGLE LUMEN POWER PORT CATHETER INSERTION Date:  5/8/20175/01/2016 12:29 pm Radiologist:  M. Daryll Brod, MD Guidance:  Ultrasound and fluoroscopic MEDICATIONS: 2 g Ancef; The antibiotic was administered within an appropriate time interval prior to skin puncture. ANESTHESIA/SEDATION: Versed 1.5 mg IV; Fentanyl 75 mcg IV; Moderate Sedation Time:  35 minutes The patient was continuously monitored during the procedure by the interventional radiology nurse under my direct supervision. FLUOROSCOPY TIME:  30 seconds (2 mGy) COMPLICATIONS: None immediate. CONTRAST:  None. PROCEDURE: Informed consent was obtained from the patient following explanation of the procedure, risks, benefits and alternatives. The patient understands, agrees and consents for the procedure. All questions were addressed. A time out was performed. Maximal barrier sterile technique utilized including caps, mask, sterile gowns, sterile gloves, large sterile drape, hand hygiene, and 2% chlorhexidine  scrub. Under sterile conditions and local anesthesia, right internal jugular micropuncture venous access was performed. Access was performed with ultrasound. Images were obtained for documentation of the patent right internal jugular vein. A guide wire was inserted followed by a transitional dilator. This allowed insertion of a guide wire and catheter into the IVC. Measurements were obtained from the SVC / RA junction back to the right IJ venotomy site. In the right infraclavicular chest, a subcutaneous pocket was created over the second anterior rib.  This was done under sterile conditions and local anesthesia. 1% lidocaine with epinephrine was utilized for this. A 2.5 cm incision was made in the skin. Blunt dissection was performed to create a subcutaneous pocket over the right pectoralis major muscle. The pocket was flushed with saline vigorously. There was adequate hemostasis. The port catheter was assembled and checked for leakage. The port catheter was secured in the pocket with two retention sutures. The tubing was tunneled subcutaneously to the right venotomy site and inserted into the SVC/RA junction through a valved peel-away sheath. Position was confirmed with fluoroscopy. Images were obtained for documentation. The patient tolerated the procedure well. No immediate complications. Incisions were closed in a two layer fashion with 4 - 0 Vicryl suture. Dermabond was applied to the skin. The port catheter was accessed, blood was aspirated followed by saline and heparin flushes. Needle was removed. A dry sterile dressing was applied. IMPRESSION: Ultrasound and fluoroscopically guided right internal jugular single lumen power port catheter insertion. Tip in the SVC/RA junction. Catheter ready for use. Electronically Signed   By: Jerilynn Mages.  Shick M.D.   On: 11/04/2015 12:55   Dg Abd Portable 1v  11/03/2015  CLINICAL DATA:  Right upper quadrant pain and constipation. Pancreatic carcinoma. EXAM: PORTABLE ABDOMEN - 1 VIEW COMPARISON:  CT abdomen and pelvis Oct 28, 2015 FINDINGS: There is moderate stool throughout the colon. There is no bowel dilatation or air-fluid level suggesting obstruction. No free air. Small phlebolith in left pelvis. IMPRESSION: No bowel obstruction or free air. Moderate stool throughout colon. Colon does not appear distended with stool. Electronically Signed   By: Lowella Grip III M.D.   On: 11/03/2015 10:13        Scheduled Meds: . [START ON 11/05/2015]  ceFAZolin (ANCEF) IV  2 g Intravenous To SS-Surg  . feeding supplement  1  Container Oral TID BM  . methylnaltrexone  12 mg Subcutaneous Once  . oxyCODONE  20 mg Oral Q12H  . polyethylene glycol  17 g Oral BID  . senna-docusate  2 tablet Oral BID  . sodium phosphate  1 enema Rectal Once   Continuous Infusions: . heparin       LOS: 5 days    Time spent: 20 minutes.    [facility], MD [facility] Pager [phone]  If 7PM-7AM, please contact night-coverage [email or web address] Password [email or web address] 11/04/2015, 2:19 PM

## 2015-11-04 NOTE — Progress Notes (Addendum)
Patient had an episode of emesis. Offered patient PRN zofran and patient refused. Explained to patient the importance of ambulation, hydration and taking the medications the doctor prescribed to help him have a bowel movement and also relieve nausea.n Patient refused at this time. Patient appears agitated and stating "why don't y'all just give me a second" and repeating it. RN stated she will come back and check on patient, and asked patient to call when he wants enema/medication. Dr. Algis Liming aware.

## 2015-11-04 NOTE — Progress Notes (Signed)
Dr. Algis Liming verbal order to contact IR Dr. Annamaria Boots to see if patient can resume diet and heparin drip. Dr. Annamaria Boots verbal order to restart heparin drip at 1600 and okay to resume diet now.

## 2015-11-05 ENCOUNTER — Telehealth: Payer: Self-pay | Admitting: *Deleted

## 2015-11-05 ENCOUNTER — Other Ambulatory Visit: Payer: Self-pay

## 2015-11-05 ENCOUNTER — Inpatient Hospital Stay: Payer: Self-pay | Admitting: Hematology and Oncology

## 2015-11-05 ENCOUNTER — Other Ambulatory Visit: Payer: Self-pay | Admitting: *Deleted

## 2015-11-05 ENCOUNTER — Encounter: Payer: Self-pay | Admitting: Hematology and Oncology

## 2015-11-05 ENCOUNTER — Other Ambulatory Visit: Payer: Self-pay | Admitting: Hematology and Oncology

## 2015-11-05 DIAGNOSIS — K5909 Other constipation: Secondary | ICD-10-CM

## 2015-11-05 DIAGNOSIS — I82401 Acute embolism and thrombosis of unspecified deep veins of right lower extremity: Secondary | ICD-10-CM

## 2015-11-05 DIAGNOSIS — C259 Malignant neoplasm of pancreas, unspecified: Secondary | ICD-10-CM

## 2015-11-05 DIAGNOSIS — G893 Neoplasm related pain (acute) (chronic): Secondary | ICD-10-CM

## 2015-11-05 DIAGNOSIS — C787 Secondary malignant neoplasm of liver and intrahepatic bile duct: Principal | ICD-10-CM

## 2015-11-05 DIAGNOSIS — D638 Anemia in other chronic diseases classified elsewhere: Secondary | ICD-10-CM

## 2015-11-05 LAB — CBC
HCT: 35.2 % — ABNORMAL LOW (ref 39.0–52.0)
HEMOGLOBIN: 11.5 g/dL — AB (ref 13.0–17.0)
MCH: 27.4 pg (ref 26.0–34.0)
MCHC: 32.7 g/dL (ref 30.0–36.0)
MCV: 84 fL (ref 78.0–100.0)
Platelets: 297 10*3/uL (ref 150–400)
RBC: 4.19 MIL/uL — ABNORMAL LOW (ref 4.22–5.81)
RDW: 14.5 % (ref 11.5–15.5)
WBC: 14.3 10*3/uL — ABNORMAL HIGH (ref 4.0–10.5)

## 2015-11-05 LAB — COMPREHENSIVE METABOLIC PANEL
ALK PHOS: 273 U/L — AB (ref 38–126)
ALT: 34 U/L (ref 17–63)
ANION GAP: 13 (ref 5–15)
AST: 31 U/L (ref 15–41)
Albumin: 2.9 g/dL — ABNORMAL LOW (ref 3.5–5.0)
BUN: 7 mg/dL (ref 6–20)
CALCIUM: 9.8 mg/dL (ref 8.9–10.3)
CHLORIDE: 96 mmol/L — AB (ref 101–111)
CO2: 25 mmol/L (ref 22–32)
CREATININE: 0.8 mg/dL (ref 0.61–1.24)
Glucose, Bld: 118 mg/dL — ABNORMAL HIGH (ref 65–99)
Potassium: 4.4 mmol/L (ref 3.5–5.1)
Sodium: 134 mmol/L — ABNORMAL LOW (ref 135–145)
Total Bilirubin: 1.1 mg/dL (ref 0.3–1.2)
Total Protein: 7.6 g/dL (ref 6.5–8.1)

## 2015-11-05 LAB — HEPARIN LEVEL (UNFRACTIONATED): HEPARIN UNFRACTIONATED: 0.46 [IU]/mL (ref 0.30–0.70)

## 2015-11-05 MED ORDER — HYDRALAZINE HCL 20 MG/ML IJ SOLN
5.0000 mg | Freq: Once | INTRAMUSCULAR | Status: AC
  Administered 2015-11-05: 5 mg via INTRAVENOUS
  Filled 2015-11-05: qty 1

## 2015-11-05 MED ORDER — RIVAROXABAN 15 MG PO TABS
15.0000 mg | ORAL_TABLET | Freq: Two times a day (BID) | ORAL | Status: DC
  Administered 2015-11-06 – 2015-11-07 (×3): 15 mg via ORAL
  Filled 2015-11-05 (×6): qty 1

## 2015-11-05 MED ORDER — WHITE PETROLATUM GEL
Status: AC
  Administered 2015-11-05: 0.2
  Filled 2015-11-05: qty 1

## 2015-11-05 MED ORDER — METHYLNALTREXONE BROMIDE 12 MG/0.6ML ~~LOC~~ SOLN
12.0000 mg | Freq: Once | SUBCUTANEOUS | Status: AC
  Administered 2015-11-05: 12 mg via SUBCUTANEOUS
  Filled 2015-11-05: qty 0.6

## 2015-11-05 MED ORDER — RIVAROXABAN 15 MG PO TABS
15.0000 mg | ORAL_TABLET | ORAL | Status: AC
  Administered 2015-11-05: 15 mg via ORAL
  Filled 2015-11-05: qty 1

## 2015-11-05 MED ORDER — OXYCODONE HCL 5 MG PO TABS
10.0000 mg | ORAL_TABLET | ORAL | Status: DC | PRN
  Administered 2015-11-05 – 2015-11-07 (×8): 10 mg via ORAL
  Filled 2015-11-05 (×8): qty 2

## 2015-11-05 MED ORDER — OXYCODONE HCL ER 15 MG PO T12A
30.0000 mg | EXTENDED_RELEASE_TABLET | Freq: Two times a day (BID) | ORAL | Status: DC
  Administered 2015-11-05 – 2015-11-07 (×5): 30 mg via ORAL
  Filled 2015-11-05 (×5): qty 2

## 2015-11-05 MED ORDER — CALCIUM CARBONATE ANTACID 500 MG PO CHEW
2.0000 | CHEWABLE_TABLET | Freq: Two times a day (BID) | ORAL | Status: DC | PRN
  Administered 2015-11-05: 400 mg via ORAL
  Filled 2015-11-05: qty 2

## 2015-11-05 MED ORDER — RIVAROXABAN 15 MG PO TABS
15.0000 mg | ORAL_TABLET | Freq: Once | ORAL | Status: AC
  Administered 2015-11-05: 15 mg via ORAL
  Filled 2015-11-05: qty 1

## 2015-11-05 NOTE — Progress Notes (Signed)
Nutrition Follow-up  DOCUMENTATION CODES:   Not applicable  INTERVENTION:   -D/c Boost Breeze po TID, each supplement provides 250 kcal and 9 grams of protein -Magic Cup TID with meals  NUTRITION DIAGNOSIS:   Inadequate oral intake related to poor appetite as evidenced by per patient/family report.  Ongoing  GOAL:   Patient will meet greater than or equal to 90% of their needs  Unmet  MONITOR:   PO intake, Supplement acceptance, Labs, Weight trends, Skin, I & O's  REASON FOR ASSESSMENT:   Malnutrition Screening Tool    ASSESSMENT:   Nathan [name] is a 45 y.o. male with a Past Medical History of abd hernia who presents with likely metastatic pancreatic cancer. Pt aware of likely dx and poor prognosis. Very unfortunate condition for pt. Aggressive Dx workup and pain treatment at this time.   Pt underwent rt hepatic biopsy on 10/31/15. Pt underwent port-a-cath placement on 11/04/15. Per oncology notes, plan to start chemotherapy at discharge.   Staff report that pt is in a lot of pain. Per chart review, chemotherapy education was attempted just prior to RD visit, however, pt was in too much pain to participate. Palliative care following for pain control and just adjusted pain control regimen this AM. Laxative also ordered due to complaints of constipation.  Intake remains very poor. Pt is refusing Boost Breeze supplement. RD will try Magic Cup.   Labs reviewed: Na: 134.   Diet Order:  Diet regular Room service appropriate?: Yes; Fluid consistency:: Thin  Skin:  Reviewed, no issues  Last BM:  11/04/15  Height:   Ht Readings from Last 1 Encounters:  10/28/15 5' 10.8" (1.798 m)    Weight:   Wt Readings from Last 1 Encounters:  10/28/15 194 lb 11.2 oz (88.315 kg)    Ideal Body Weight:  75.5 kg  BMI:  Body mass index is 27.32 kg/(m^2).  Estimated Nutritional Needs:   Kcal:  2000-2200  Protein:  105-120 grams  Fluid:  2.0-2.2 L  EDUCATION NEEDS:   No  education needs identified at this time  Tieasha Larsen A. Jimmye Norman, RD, LDN, CDE Pager: [phone] After hours Pager: [phone]

## 2015-11-05 NOTE — Progress Notes (Addendum)
ANTICOAGULATION CONSULT NOTE   Assessment: 77 YOM continues on IV heparin for bilateral PEs and DVTs  Now s/p liver biopsy and port-a-cath placement HL therapeutic, CBC stable  Plan: Heparin at 2300 units/h Daily HL/CBC Monitor s/sx bleeding  Awaiting transition to NOAC  Thank you Anette Guarneri, PharmD [phone]  11/05/2015 9:28 AM    Transitioned to Xarelto 15 mg po BID x 3 weeks then 20 mg po daily  Thank you Anette Guarneri, PharmD

## 2015-11-05 NOTE — Progress Notes (Signed)
Nathan [name]   DOB:[birth year]   [id number]    Subjective: The patient continues to have persistent pain but better controlled. He complained of severe constipation. The patient denies any recent signs or symptoms of bleeding such as spontaneous epistaxis, hematuria or hematochezia.  Objective:  Filed Vitals:   11/05/15 0637 11/05/15 0717  BP: 153/105 147/97  Pulse:  90  Temp:    Resp:       Intake/Output Summary (Last 24 hours) at 11/05/15 [id number] Last data filed at 11/05/15 0800  Gross per 24 hour  Intake    263 ml  Output      0 ml  Net    263 ml    GENERAL:alert, no distress and comfortable SKIN: skin color, texture, turgor are normal, no rashes or significant lesions EYES: normal, Conjunctiva are pink and non-injected, sclera clear OROPHARYNX:no exudate, no erythema and lips, buccal mucosa, and tongue normal  Musculoskeletal:no cyanosis of digits and no clubbing  NEURO: alert & oriented x 3 with fluent speech, no focal motor/sensory deficits   Labs:  Lab Results  Component Value Date   WBC 11.7* 11/04/2015   HGB 10.6* 11/04/2015   HCT 32.4* 11/04/2015   MCV 84.8 11/04/2015   PLT 357 11/04/2015    Lab Results  Component Value Date   NA 134* 10/31/2015   K 3.9 10/31/2015   CL 99* 10/31/2015   CO2 23 10/31/2015    Studies:  Ir Fluoro Guide Cv Line Right  11/04/2015  CLINICAL DATA:  Metastatic pancreas cancer EXAM: RIGHT INTERNAL JUGULAR SINGLE LUMEN POWER PORT CATHETER INSERTION Date:  5/8/20175/01/2016 12:29 pm Radiologist:  M. Daryll Brod, MD Guidance:  Ultrasound and fluoroscopic MEDICATIONS: 2 g Ancef; The antibiotic was administered within an appropriate time interval prior to skin puncture. ANESTHESIA/SEDATION: Versed 1.5 mg IV; Fentanyl 75 mcg IV; Moderate Sedation Time:  35 minutes The patient was continuously monitored during the procedure by the interventional radiology nurse under my direct supervision. FLUOROSCOPY TIME:  30 seconds (2 mGy) COMPLICATIONS:  None immediate. CONTRAST:  None. PROCEDURE: Informed consent was obtained from the patient following explanation of the procedure, risks, benefits and alternatives. The patient understands, agrees and consents for the procedure. All questions were addressed. A time out was performed. Maximal barrier sterile technique utilized including caps, mask, sterile gowns, sterile gloves, large sterile drape, hand hygiene, and 2% chlorhexidine scrub. Under sterile conditions and local anesthesia, right internal jugular micropuncture venous access was performed. Access was performed with ultrasound. Images were obtained for documentation of the patent right internal jugular vein. A guide wire was inserted followed by a transitional dilator. This allowed insertion of a guide wire and catheter into the IVC. Measurements were obtained from the SVC / RA junction back to the right IJ venotomy site. In the right infraclavicular chest, a subcutaneous pocket was created over the second anterior rib. This was done under sterile conditions and local anesthesia. 1% lidocaine with epinephrine was utilized for this. A 2.5 cm incision was made in the skin. Blunt dissection was performed to create a subcutaneous pocket over the right pectoralis major muscle. The pocket was flushed with saline vigorously. There was adequate hemostasis. The port catheter was assembled and checked for leakage. The port catheter was secured in the pocket with two retention sutures. The tubing was tunneled subcutaneously to the right venotomy site and inserted into the SVC/RA junction through a valved peel-away sheath. Position was confirmed with fluoroscopy. Images were obtained for documentation. The patient tolerated  the procedure well. No immediate complications. Incisions were closed in a two layer fashion with 4 - 0 Vicryl suture. Dermabond was applied to the skin. The port catheter was accessed, blood was aspirated followed by saline and heparin flushes.  Needle was removed. A dry sterile dressing was applied. IMPRESSION: Ultrasound and fluoroscopically guided right internal jugular single lumen power port catheter insertion. Tip in the SVC/RA junction. Catheter ready for use. Electronically Signed   By: Jerilynn Mages.  Shick M.D.   On: 11/04/2015 12:55   Ir US Guide Vasc Access Right  11/04/2015  CLINICAL DATA:  Metastatic pancreas cancer EXAM: RIGHT INTERNAL JUGULAR SINGLE LUMEN POWER PORT CATHETER INSERTION Date:  5/8/20175/01/2016 12:29 pm Radiologist:  M. Daryll Brod, MD Guidance:  Ultrasound and fluoroscopic MEDICATIONS: 2 g Ancef; The antibiotic was administered within an appropriate time interval prior to skin puncture. ANESTHESIA/SEDATION: Versed 1.5 mg IV; Fentanyl 75 mcg IV; Moderate Sedation Time:  35 minutes The patient was continuously monitored during the procedure by the interventional radiology nurse under my direct supervision. FLUOROSCOPY TIME:  30 seconds (2 mGy) COMPLICATIONS: None immediate. CONTRAST:  None. PROCEDURE: Informed consent was obtained from the patient following explanation of the procedure, risks, benefits and alternatives. The patient understands, agrees and consents for the procedure. All questions were addressed. A time out was performed. Maximal barrier sterile technique utilized including caps, mask, sterile gowns, sterile gloves, large sterile drape, hand hygiene, and 2% chlorhexidine scrub. Under sterile conditions and local anesthesia, right internal jugular micropuncture venous access was performed. Access was performed with ultrasound. Images were obtained for documentation of the patent right internal jugular vein. A guide wire was inserted followed by a transitional dilator. This allowed insertion of a guide wire and catheter into the IVC. Measurements were obtained from the SVC / RA junction back to the right IJ venotomy site. In the right infraclavicular chest, a subcutaneous pocket was created over the second anterior rib.  This was done under sterile conditions and local anesthesia. 1% lidocaine with epinephrine was utilized for this. A 2.5 cm incision was made in the skin. Blunt dissection was performed to create a subcutaneous pocket over the right pectoralis major muscle. The pocket was flushed with saline vigorously. There was adequate hemostasis. The port catheter was assembled and checked for leakage. The port catheter was secured in the pocket with two retention sutures. The tubing was tunneled subcutaneously to the right venotomy site and inserted into the SVC/RA junction through a valved peel-away sheath. Position was confirmed with fluoroscopy. Images were obtained for documentation. The patient tolerated the procedure well. No immediate complications. Incisions were closed in a two layer fashion with 4 - 0 Vicryl suture. Dermabond was applied to the skin. The port catheter was accessed, blood was aspirated followed by saline and heparin flushes. Needle was removed. A dry sterile dressing was applied. IMPRESSION: Ultrasound and fluoroscopically guided right internal jugular single lumen power port catheter insertion. Tip in the SVC/RA junction. Catheter ready for use. Electronically Signed   By: Jerilynn Mages.  Shick M.D.   On: 11/04/2015 12:55   Dg Abd Portable 1v  11/03/2015  CLINICAL DATA:  Right upper quadrant pain and constipation. Pancreatic carcinoma. EXAM: PORTABLE ABDOMEN - 1 VIEW COMPARISON:  CT abdomen and pelvis Oct 28, 2015 FINDINGS: There is moderate stool throughout the colon. There is no bowel dilatation or air-fluid level suggesting obstruction. No free air. Small phlebolith in left pelvis. IMPRESSION: No bowel obstruction or free air. Moderate stool throughout colon. Colon does  not appear distended with stool. Electronically Signed   By: Lowella Grip III M.D.   On: 11/03/2015 10:13    Assessment & Plan:   Metastatic pancreatic cancer with diffuse metastatic lesions in the liver His hospital discharge is  delayed due to port placement and uncontrolled constipation and abdominal pain. I brought with me copies of pathology report, NCCN guidelines and plan of care and research papers with me today to share with the patient and his mother. The treatment intent is strictly palliative. The patient has stage IV disease. He is not curable. I explained to them why surgery is not an option. I reviewed in publication at [place] of medicine comparing FOLFIRINOX versus with gemcitabine for palliative chemotherapy. The average patients enrolled in the clinical trial is 64. Average median overall survival for patients enrolled in the aggressive treatment group is 11 months. The patient is not a candidate for clinical trial due to diagnosis of DVT and PE. He also has no insurance.  I can offer him only standard of care but could give him aggressive treatment due to excellent performance status. We discussed the risks, benefits, side effects of chemotherapy including mucositis, nausea, diarrhea, pancytopenia, risks of infection, allergic reaction and death and he agreed to proceed with treatment as soon as possible. I will get my chemotherapy education class to provide chemotherapy teaching today.  Cancer associated pain Appreciate palliative care consult in pain management. Hopefully we can get his pain under controlled by the time for discharge  Right lower extremity DVT and PE on anticoagulation therapy I agree with IV heparin for now. Due to plan for long-term anticoagulation therapy, after liver biopsy and port placement, he can be transitioned to oral DOAC (Xarelto or Eliquis are OK)  Anemia of chronic disease This is likely anemia of chronic disease. The patient denies recent history of bleeding such as epistaxis, hematuria or hematochezia. He is asymptomatic from the anemia. We will observe for now. He does not require transfusion now.   Protein calorie malnutrition Recommend increase oral  intake as tolerated. Likely related to malignancy  Constipation, Severe  On bowel regimen as recommended by palliative care service  Discharge planning Appreciate assistance from palliative care team. He should get advance directives and living well finalized while hospitalized If he gets discharged today, we will proceed with chemotherapy tomorrow as an outpatient. If his discharge his delay, I will move his treatment to stop in the outpatient clinic next week on 11/11/2015. Please call if questions arise   [facility], Syair Fricker, MD 11/05/2015  8:38 AM

## 2015-11-05 NOTE — Progress Notes (Signed)
Daily Progress Note   Patient Name: Nathan [name]       Date: 11/05/2015 DOB: 09-Feb-1971  Age: 45 y.o. MRN#: [id number] Attending Physician: Modena Jansky, MD Primary Care Physician: No primary care provider on file. Admit Date: 10/28/2015  Reason for Consultation/Follow-up: Pain control  Subjective: Awake alert, sitting up in bed, mother at bedside.  Pain in leg, abdomen. He desires to avoid IV opioids if at all possible. He has been having some improvement in pain on current regimen of OxyContin to 20 mg PO BID and oxycodone 7.5 Q3hrs, but still with average pain score of 7-8/10.  Have been working to expand bowel regimen, but reports last BM 1 week ago.  Has been getting miralax, senna, multiple suppositories and enemas over the past few days.  Had first dose of relistor last evening without effect. Appreciate  Dr Alvy Bimler recommendations.   Length of Stay: 6  Current Medications: Scheduled Meds:  . feeding supplement  1 Container Oral TID BM  . methylnaltrexone  12 mg Subcutaneous Once  . oxyCODONE  30 mg Oral Q12H  . polyethylene glycol  17 g Oral BID  . senna-docusate  2 tablet Oral BID    Continuous Infusions: . heparin 2,300 Units/hr (11/05/15 0933)    PRN Meds: acetaminophen **OR** acetaminophen, ALPRAZolam, bisacodyl, menthol-cetylpyridinium, morphine injection, ondansetron **OR** ondansetron (ZOFRAN) IV, oxyCODONE  Physical Exam         Awake alert Pain in leg and abdomen S1 S2 Clear Abdomen soft mildly distended No edema Non focal   Vital Signs: BP 147/97 mmHg  Pulse 90  Temp(Src) 98.1 F (36.7 C) (Oral)  Resp 18  Ht 5' 10.8" (1.798 m)  Wt 88.315 kg (194 lb 11.2 oz)  BMI 27.32 kg/m2  SpO2 98% SpO2: SpO2: 98 % O2 Device: O2 Device: Nasal Cannula O2  Flow Rate: O2 Flow Rate (L/min): 2 L/min  Intake/output summary:   Intake/Output Summary (Last 24 hours) at 11/05/15 0942 Last data filed at 11/05/15 0800  Gross per 24 hour  Intake    263 ml  Output      0 ml  Net    263 ml   LBM: Last BM Date: 11/04/15 Baseline Weight: Weight: 88.315 kg (194 lb 11.2 oz) Most recent weight: Weight: 88.315 kg (194 lb 11.2 oz)  Palliative Assessment/Data:    Flowsheet Rows        Most Recent Value   Intake Tab    Referral Department  Hospitalist   Unit at Time of Referral  Oncology Unit   Palliative Care Primary Diagnosis  Cancer   Palliative Care Type  Return patient Palliative Care   Reason for referral  Pain, Non-pain Symptom, Clarify Goals of Care   Date first seen by Palliative Care  10/29/15   Clinical Assessment    Palliative Performance Scale Score  40%   Pain Max last 24 hours  8   Pain Min Last 24 hours  5   Psychosocial & Spiritual Assessment    Palliative Care Outcomes    Patient/Family meeting held?  Yes   Who was at the meeting?  patient, girlfriend, Dr Algis Liming    Palliative Care Outcomes  Clarified goals of care, Improved pain interventions   Palliative Care follow-up planned  Yes, Home   Palliative Care Follow-up Reason  Pain, Clarify goals of care      Patient Active Problem List   Diagnosis Date Noted  . Constipation   . Acute pulmonary embolism ([place]) 10/31/2015  . Acute deep vein thrombosis (DVT) of lower extremity ([place]) 10/31/2015  . Left ankle pain   . Carcinoma of pancreas metastatic to liver ([place])   . Encounter for palliative care   . Goals of care, counseling/discussion   . Upper abdominal pain 10/28/2015  . Weight loss 10/28/2015    Palliative Care Assessment & Plan   Patient Profile:  45 yo with recently diagnosed possible metastatic pancreatic cancer.   Assessment: Cancer pancreas metastatic to liver DVT PE Uncontrolled pain Uncontrolled anxiety.   Recommendations/Plan:  Still  decicing about selecting a HCPOA agent, advised chaplains could complete the documentation while he is in the hospital.   Pain: Reports better control compared to admission, but still with continuous 8/10 pain on current regimen.  His total oxycodone usage has been > 60mg  per 24 hour period over the last several days with inadequate control of his pain.  Plan to increase his oxycontin to 30mg  BID.  Will also increase PRN dose to oxycodone 10mg  Q3hrs prn.   Constipation: Severe, Opioid Induced.  He received first dose of Relistor last evening without BM.  Continues to have severe, refractory constipation despite aggressive oral and rectal regimen.  I feel that he would best be served by continuing trial of Relistor and working to resolve his opioid induced bowel dysfunction prior to discharge.  Relistor can be given once every 24 hours for up to three days.  His last dose was at approx 2100 last evening.  I scheduled for another dose this evening at 2100.  Goals of Care and Additional Recommendations:  Limitations on Scope of Treatment: Full Scope Treatment  Code Status:    Code Status Orders        Start     Ordered   10/28/15 1824  Full code   Continuous     10/28/15 1823    Code Status History    Date Active Date Inactive Code Status Order ID Comments User Context   This patient has a current code status but no historical code status.       Prognosis:   Unable to determine  Discharge Planning:  Home with [place] was discussed with  Patient, mother,   Thank you for allowing the Palliative Medicine Team to assist in the care of  this patient.   Time In:  0900 Time Out: 0940 Total Time 40 Prolonged Time Billed  no       Greater than 50%  of this time was spent counseling and coordinating care related to the above assessment and plan.  Micheline Rough, MD [phone]  Please contact Palliative Medicine Team phone at [phone] for questions and concerns.

## 2015-11-05 NOTE — Progress Notes (Signed)
PROGRESS NOTE  Nathan [name]  ZOX:[id number] DOB: 1971-01-25  DOA: 10/28/2015 PCP: No primary care provider on file.  Outpatient Specialists:  None  Brief Narrative:  45 year old male with no significant past medical history, presented to ED with 1 month history of upper abdominal pain radiating to back, 30 pound weight loss over the last couple of months, decreased appetite. CT abdomen and pelvis suggested pancreatic cancer with metastasis to liver, spleen and both kidneys. Interventional radiology consulted and plan ultrasound-guided biopsy on 5/4. Palliative care consulted for goals of care. CTA chest confirmed bilateral PE. Lower extremity venous Dopplers confirmed bilateral DVT. Started on IV heparin per pharmacy. Oncology consulted. Status post liver biopsy on 5/4. S/P Port-A-Cath on 5/8. Pathology confirmed metastatic adenocarcinoma, likely pancreaticobiliary primary. Transitioned to Xarelto 5/9. Barrier to discharge: Pain control and constipation. Hopefully can discharge home 5/10. Oncology has rescheduled follow-up for 5/15 to start chemotherapy.   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 - Palliative care following closely and adjusting pain medications. Pain not adequately controlled. - s/p Port-A-Cath placement by IR on 5/8. - DC home when pain is controlled and bowel regimen optimized. Oncology has arranged outpatient follow-up to start chemotherapy on 11/11/15.  Liver Biopsy results Diagnosis Liver, needle/core  biopsy, right hepatic lobe - POSITIVE FOR METASTATIC ADENOCARCINOMA. - SEE COMMENT. Microscopic Comment Immunohistochemical stains are performed. The tumor is positive for cytokeratin 7 and shows focal positivity for cytokeratin 20. The tumor is negative for CDX-2, TTF-1, and prostein. The staining pattern coupled with the morphology is compatible with metastatic adenocarcinoma of a pancreatobiliary primary source, especially given the presence of a large pancreatic mass lesion.   Acute bilateral pulmonary embolism and bilateral lower extremity DVT's - CTA chest 5/3 confirmed bilateral acute PE without right heart strain. Bilateral lower extremity venous Doppler confirms DVT in bilateral lower extremities. - Patient was initially placed on IV heparin infusion. His heparin infusion was temporarily held for procedures i.e. liver biopsy and Port-A-Cath placement.  - Transitioned to Xarelto on 5/9.  Anemia of chronic disease - Stable. Follow CBCs periodically.  Left ankle and foot pain - No history of trauma. X-rays without acute findings.? Related to DVT versus other musculoskeletal skeletal etiology. Pain management. K pad. Improved.   Opioid-induced refractory Constipation - Palliative team have adjusted meds. Ambulate. Has been eating well. Has also tried prune juice. - Small hard BM 2 on 5/8. Palliative team has started Relistor.  Elevated blood pressure - Secondary to pain issues. Not on blood pressure medications. Monitor.   DVT prophylaxis: IV heparin drip. Code Status: Full Family Communication: Discussed with patient's mother at bedside. Disposition Plan: DC home when medically stable, possibly 5/10.   Consultants:   Interventional radiology  Palliative care medicine  Oncology  Procedures:   Bilateral lower extremity venous Dopplers 10/30/15: Vascular Ultrasound Lower extremity venous duplex has been completed. Preliminary findings: DVT noted in the Right peroneal  veins, Left posterior tibial veins, and Left peroneal veins. Superficial thrombosis noted in the left small saphenous vein.  Liver biopsy 10/31/15  Port-A-Cath placed 5/8 by IR.  Antimicrobials:   None    Subjective: Abdominal pain not adequately controlled. Rated at 7-8/10 on average. Left ankle/foot pain almost resolved. Ongoing constipation despite several measures. Had small hard BM following enema and suppository on 5/8.  Objective:  Filed Vitals:   11/05/15 [id number] 11/05/15 0645 11/05/15 0717 11/05/15 1502  BP: 153/105  147/97 140/88  Pulse:   90 97  Temp:    98.3 F (36.8 C)  TempSrc:      Resp:    19  Height:      Weight:      SpO2:  98%  97%    Intake/Output Summary (Last 24 hours) at 11/05/15 1614 Last data filed at 11/05/15 0800  Gross per 24 hour  Intake    263 ml  Output      0 ml  Net    263 ml   Filed Weights   10/28/15 2012  Weight: 88.315 kg (194 lb 11.2 oz)    Examination:  General exam: Pleasant young male seen ambulating in the room. Respiratory system: Clear to auscultation. Respiratory effort normal. Right upper chest Port-A-Cath site clean and dry. Cardiovascular system: S1 & S2 heard, RRR.Marland Kitchen No JVD, murmurs, rubs, gallops or clicks. No pedal edema. Gastrointestinal system: Abdomen is non distended, soft and nontender. No organomegaly or masses felt. Normal bowel sounds heard.  Central nervous system: Alert and oriented. No focal neurological deficits. Extremities: Symmetric 5 x 5 power.Left ankle and foot minimally swollen but significantly improved compared to last week. Good peripheral pulses felt. Skin: No rashes, lesions or ulcers Psychiatry: Judgement and insight appear normal. Mood & affect appropriate.     Data Reviewed: I have personally reviewed following labs and imaging studies  CBC:  Recent Labs Lab 11/01/15 0456 11/02/15 0355 11/03/15 0614 11/04/15 0532 11/05/15 0747  WBC 11.1* 10.2 10.2 11.7* 14.3*  HGB 10.4* 10.1*  10.0* 10.6* 11.5*  HCT 32.4* 31.8* 31.4* 32.4* 35.2*  MCV 85.9 85.0 83.5 84.8 84.0  PLT 216 289 329 357 [id number]   Basic Metabolic Panel:  Recent Labs Lab 10/30/15 0527 10/31/15 0207 11/05/15 0747  NA 136 134* 134*  K 4.0 3.9 4.4  CL 101 99* 96*  CO2 25 23 25   GLUCOSE 109* 118* 118*  BUN <5* 6 7  CREATININE 0.95 0.89 0.80  CALCIUM 9.0 8.8* 9.8   GFR: Estimated Creatinine Clearance: 126 mL/min (by C-G formula based on Cr of 0.8). Liver Function Tests:  Recent Labs Lab 10/31/15 0207 11/05/15 0747  AST 36 31  ALT 32 34  ALKPHOS 155* 273*  BILITOT 0.8 1.1  PROT 6.3* 7.6  ALBUMIN 2.7* 2.9*   No results for input(s): LIPASE, AMYLASE in the last 168 hours. No results for input(s): AMMONIA in the last 168 hours. Coagulation Profile:  Recent Labs Lab 10/30/15 0527 10/31/15 0207  INR 1.40 1.37   Cardiac Enzymes: No results for input(s): CKTOTAL, CKMB, CKMBINDEX, TROPONINI in the last 168 hours. BNP (last 3 results) No results for input(s): PROBNP in the last 8760 hours. HbA1C: No results for input(s): HGBA1C in the last 72 hours. CBG: No results for input(s): GLUCAP in the last 168 hours. Lipid Profile: No results for input(s): CHOL, HDL, LDLCALC, TRIG, CHOLHDL, LDLDIRECT in the last 72 hours. Thyroid Function Tests: No results for input(s): TSH, T4TOTAL, FREET4, T3FREE, THYROIDAB in the last 72 hours. Anemia Panel: No results for input(s): VITAMINB12, FOLATE, FERRITIN, TIBC, IRON, RETICCTPCT in  the last 72 hours. Urine analysis:    Component Value Date/Time   COLORURINE AMBER* 10/28/2015 [place] 10/28/2015 1313   LABSPEC 1.027 10/28/2015 1313   PHURINE 6.0 10/28/2015 1313   GLUCOSEU NEGATIVE 10/28/2015 1313   HGBUR MODERATE* 10/28/2015 1313   BILIRUBINUR SMALL* 10/28/2015 1313   KETONESUR 15* 10/28/2015 1313   PROTEINUR 30* 10/28/2015 1313   NITRITE NEGATIVE 10/28/2015 1313   LEUKOCYTESUR NEGATIVE 10/28/2015 1313        Radiology  Studies: Ir Fluoro Guide Cv Line Right  11/04/2015  CLINICAL DATA:  Metastatic pancreas cancer EXAM: RIGHT INTERNAL JUGULAR SINGLE LUMEN POWER PORT CATHETER INSERTION Date:  5/8/20175/01/2016 12:29 pm Radiologist:  M. Daryll Brod, MD Guidance:  Ultrasound and fluoroscopic MEDICATIONS: 2 g Ancef; The antibiotic was administered within an appropriate time interval prior to skin puncture. ANESTHESIA/SEDATION: Versed 1.5 mg IV; Fentanyl 75 mcg IV; Moderate Sedation Time:  35 minutes The patient was continuously monitored during the procedure by the interventional radiology nurse under my direct supervision. FLUOROSCOPY TIME:  30 seconds (2 mGy) COMPLICATIONS: None immediate. CONTRAST:  None. PROCEDURE: Informed consent was obtained from the patient following explanation of the procedure, risks, benefits and alternatives. The patient understands, agrees and consents for the procedure. All questions were addressed. A time out was performed. Maximal barrier sterile technique utilized including caps, mask, sterile gowns, sterile gloves, large sterile drape, hand hygiene, and 2% chlorhexidine scrub. Under sterile conditions and local anesthesia, right internal jugular micropuncture venous access was performed. Access was performed with ultrasound. Images were obtained for documentation of the patent right internal jugular vein. A guide wire was inserted followed by a transitional dilator. This allowed insertion of a guide wire and catheter into the IVC. Measurements were obtained from the SVC / RA junction back to the right IJ venotomy site. In the right infraclavicular chest, a subcutaneous pocket was created over the second anterior rib. This was done under sterile conditions and local anesthesia. 1% lidocaine with epinephrine was utilized for this. A 2.5 cm incision was made in the skin. Blunt dissection was performed to create a subcutaneous pocket over the right pectoralis major muscle. The pocket was flushed with  saline vigorously. There was adequate hemostasis. The port catheter was assembled and checked for leakage. The port catheter was secured in the pocket with two retention sutures. The tubing was tunneled subcutaneously to the right venotomy site and inserted into the SVC/RA junction through a valved peel-away sheath. Position was confirmed with fluoroscopy. Images were obtained for documentation. The patient tolerated the procedure well. No immediate complications. Incisions were closed in a two layer fashion with 4 - 0 Vicryl suture. Dermabond was applied to the skin. The port catheter was accessed, blood was aspirated followed by saline and heparin flushes. Needle was removed. A dry sterile dressing was applied. IMPRESSION: Ultrasound and fluoroscopically guided right internal jugular single lumen power port catheter insertion. Tip in the SVC/RA junction. Catheter ready for use. Electronically Signed   By: Jerilynn Mages.  Shick M.D.   On: 11/04/2015 12:55   Ir US Guide Vasc Access Right  11/04/2015  CLINICAL DATA:  Metastatic pancreas cancer EXAM: RIGHT INTERNAL JUGULAR SINGLE LUMEN POWER PORT CATHETER INSERTION Date:  5/8/20175/01/2016 12:29 pm Radiologist:  M. Daryll Brod, MD Guidance:  Ultrasound and fluoroscopic MEDICATIONS: 2 g Ancef; The antibiotic was administered within an appropriate time interval prior to skin puncture. ANESTHESIA/SEDATION: Versed 1.5 mg IV; Fentanyl 75 mcg IV; Moderate Sedation Time:  35 minutes  The patient was continuously monitored during the procedure by the interventional radiology nurse under my direct supervision. FLUOROSCOPY TIME:  30 seconds (2 mGy) COMPLICATIONS: None immediate. CONTRAST:  None. PROCEDURE: Informed consent was obtained from the patient following explanation of the procedure, risks, benefits and alternatives. The patient understands, agrees and consents for the procedure. All questions were addressed. A time out was performed. Maximal barrier sterile technique utilized  including caps, mask, sterile gowns, sterile gloves, large sterile drape, hand hygiene, and 2% chlorhexidine scrub. Under sterile conditions and local anesthesia, right internal jugular micropuncture venous access was performed. Access was performed with ultrasound. Images were obtained for documentation of the patent right internal jugular vein. A guide wire was inserted followed by a transitional dilator. This allowed insertion of a guide wire and catheter into the IVC. Measurements were obtained from the SVC / RA junction back to the right IJ venotomy site. In the right infraclavicular chest, a subcutaneous pocket was created over the second anterior rib. This was done under sterile conditions and local anesthesia. 1% lidocaine with epinephrine was utilized for this. A 2.5 cm incision was made in the skin. Blunt dissection was performed to create a subcutaneous pocket over the right pectoralis major muscle. The pocket was flushed with saline vigorously. There was adequate hemostasis. The port catheter was assembled and checked for leakage. The port catheter was secured in the pocket with two retention sutures. The tubing was tunneled subcutaneously to the right venotomy site and inserted into the SVC/RA junction through a valved peel-away sheath. Position was confirmed with fluoroscopy. Images were obtained for documentation. The patient tolerated the procedure well. No immediate complications. Incisions were closed in a two layer fashion with 4 - 0 Vicryl suture. Dermabond was applied to the skin. The port catheter was accessed, blood was aspirated followed by saline and heparin flushes. Needle was removed. A dry sterile dressing was applied. IMPRESSION: Ultrasound and fluoroscopically guided right internal jugular single lumen power port catheter insertion. Tip in the SVC/RA junction. Catheter ready for use. Electronically Signed   By: Jerilynn Mages.  Shick M.D.   On: 11/04/2015 12:55        Scheduled Meds: .  feeding supplement  1 Container Oral TID BM  . methylnaltrexone  12 mg Subcutaneous Once  . oxyCODONE  30 mg Oral Q12H  . polyethylene glycol  17 g Oral BID  . rivaroxaban  15 mg Oral Once  . [START ON 11/06/2015] Rivaroxaban  15 mg Oral BID WC  . senna-docusate  2 tablet Oral BID   Continuous Infusions:     LOS: 6 days    Time spent: 20 minutes.    [facility], MD [facility] Pager [phone]  If 7PM-7AM, please contact night-coverage [email or web address] Password [email or web address] 11/05/2015, 4:14 PM

## 2015-11-05 NOTE — Telephone Encounter (Signed)
Per staff messages and POF I have scheduled appts. Advised scheduler of appts. JMW  

## 2015-11-05 NOTE — Discharge Instructions (Signed)
Information on my medicine - XARELTO® (rivaroxaban) ° °This medication education was reviewed with me or my healthcare representative as part of my discharge preparation.  The pharmacist that spoke with me during my hospital stay was:  Tashaya Ancrum Kay, RPH ° °WHY WAS XARELTO® PRESCRIBED FOR YOU? °Xarelto® was prescribed to treat blood clots that may have been found in the veins of your legs (deep vein thrombosis) or in your lungs (pulmonary embolism) and to reduce the risk of them occurring again. ° °What do you need to know about Xarelto®? °The starting dose is one 15 mg tablet taken TWICE daily with food for the FIRST 21 DAYS then  the dose is changed to one 20 mg tablet taken ONCE A DAY with your evening meal. ° °DO NOT stop taking Xarelto® without talking to the health care provider who prescribed the medication.  Refill your prescription for 20 mg tablets before you run out. ° °After discharge, you should have regular check-up appointments with your healthcare provider that is prescribing your Xarelto®.  In the future your dose may need to be changed if your kidney function changes by a significant amount. ° °What do you do if you miss a dose? °If you are taking Xarelto® TWICE DAILY and you miss a dose, take it as soon as you remember. You may take two 15 mg tablets (total 30 mg) at the same time then resume your regularly scheduled 15 mg twice daily the next day. ° °If you are taking Xarelto® ONCE DAILY and you miss a dose, take it as soon as you remember on the same day then continue your regularly scheduled once daily regimen the next day. Do not take two doses of Xarelto® at the same time.  ° °Important Safety Information °Xarelto® is a blood thinner medicine that can cause bleeding. You should call your healthcare provider right away if you experience any of the following: °? Bleeding from an injury or your nose that does not stop. °? Unusual colored urine (red or dark brown) or unusual colored stools  (red or black). °? Unusual bruising for unknown reasons. °? A serious fall or if you hit your head (even if there is no bleeding). ° °Some medicines may interact with Xarelto® and might increase your risk of bleeding while on Xarelto®. To help avoid this, consult your healthcare provider or pharmacist prior to using any new prescription or non-prescription medications, including herbals, vitamins, non-steroidal anti-inflammatory drugs (NSAIDs) and supplements. ° °This website has more information on Xarelto®: www.xarelto.com. °

## 2015-11-05 NOTE — Progress Notes (Signed)
Left note for ck in to see if he has insurance or applied for any insurance

## 2015-11-05 NOTE — Progress Notes (Signed)
Chemotherapy education.  Attempted to provide chemotherapy education for Folfirinox.  Patient unable to participate at this time related to significant pain and constipation.  Have left chemotherapy materials with Mom and will return tomorrow.

## 2015-11-06 ENCOUNTER — Other Ambulatory Visit: Payer: Self-pay | Admitting: *Deleted

## 2015-11-06 ENCOUNTER — Other Ambulatory Visit: Payer: Self-pay | Admitting: Hematology and Oncology

## 2015-11-06 ENCOUNTER — Inpatient Hospital Stay (HOSPITAL_COMMUNITY): Payer: Medicaid Other

## 2015-11-06 ENCOUNTER — Encounter: Payer: Self-pay | Admitting: *Deleted

## 2015-11-06 ENCOUNTER — Inpatient Hospital Stay: Payer: Self-pay | Admitting: Internal Medicine

## 2015-11-06 ENCOUNTER — Ambulatory Visit: Payer: Self-pay

## 2015-11-06 LAB — CBC
HEMATOCRIT: 35.4 % — AB (ref 39.0–52.0)
Hemoglobin: 11.3 g/dL — ABNORMAL LOW (ref 13.0–17.0)
MCH: 27 pg (ref 26.0–34.0)
MCHC: 31.9 g/dL (ref 30.0–36.0)
MCV: 84.7 fL (ref 78.0–100.0)
Platelets: 235 10*3/uL (ref 150–400)
RBC: 4.18 MIL/uL — AB (ref 4.22–5.81)
RDW: 14.5 % (ref 11.5–15.5)
WBC: 13.5 10*3/uL — AB (ref 4.0–10.5)

## 2015-11-06 MED ORDER — DIATRIZOATE MEGLUMINE & SODIUM 66-10 % PO SOLN
360.0000 mL | Freq: Once | ORAL | Status: AC
  Administered 2015-11-06: 360 mL
  Filled 2015-11-06: qty 360

## 2015-11-06 MED ORDER — SORBITOL 70 % SOLN
30.0000 mL | Freq: Two times a day (BID) | Status: DC
  Administered 2015-11-06 – 2015-11-07 (×3): 30 mL via ORAL
  Filled 2015-11-06 (×5): qty 30

## 2015-11-06 MED ORDER — DIATRIZOATE MEGLUMINE & SODIUM 66-10 % PO SOLN
ORAL | Status: AC
  Filled 2015-11-06: qty 300

## 2015-11-06 NOTE — Progress Notes (Signed)
PROGRESS NOTE  Nathan [name]  [id number] DOB: 19-Apr-1971  DOA: 10/28/2015 PCP: No primary care provider on file.  Outpatient Specialists:  None  Brief Narrative:  45 ?   1 month history of upper abdominal pain radiating to back, 30 pound weight loss over the last couple of months, decreased appetite.  CT abdomen and pelvis suggested pancreatic cancer with metastasis to liver, spleen and both kidneys.  Interventional radiology consulted and plan ultrasound-guided biopsy on 5/4. Palliative care consulted for goals of care. CTA chest confirmed bilateral PE. Lower extremity venous Dopplers confirmed bilateral DVT.  Started on IV heparin per pharmacy.  Oncology consulted. Status post liver biopsy on 5/4. S/P Port-A-Cath on 5/8. Pathology confirmed metastatic adenocarcinoma, likely pancreaticobiliary primary.  Transitioned to Xarelto 5/9.  Barrier to discharge: Pain control and constipation. Hopefully can discharge home 5/10. Oncology has rescheduled follow-up for 5/15 to start chemotherapy.   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 - Palliative care following closely and adjusting pain medications. Pain not adequately controlled. - s/p Port-A-Cath placement by IR on 5/8. - D/C home when pain is controlled and bowel regimen optimized.  Oncology has arranged outpatient follow-up to start chemotherapy on 11/11/15.  Liver Biopsy results Diagnosis Liver, needle/core biopsy, right hepatic lobe - POSITIVE FOR METASTATIC  ADENOCARCINOMA. - SEE COMMENT. Microscopic Comment Immunohistochemical stains are performed. The tumor is positive for cytokeratin 7 and shows focal positivity for cytokeratin 20. The tumor is negative for CDX-2, TTF-1, and prostein. The staining pattern coupled with the morphologyis compatible with metastatic adenocarcinoma of a pancreatobiliary primary source, especially given the presence of a large pancreatic mass lesion.   Acute bilateral pulmonary embolism and bilateral lower extremity DVT's - CTA chest 5/3 confirmed bilateral acute PE without right heart strain. Bilateral lower extremity venous Doppler confirms DVT in bilateral lower extremities. - Patient was initially placed on IV heparin infusion. His heparin infusion was temporarily held for procedures i.e. liver biopsy and Port-A-Cath placement.  - Transitioned to Xarelto on 5/9.  Anemia of chronic disease - Stable. Follow CBCs periodically.  Left ankle and foot pain - No history of trauma.  -X-rays without acute findings.?  -Related to DVT versus other musculoskeletal skeletal etiology. - Pain management. K pad. Improved.   Opioid-induced refractory Constipation - Palliative team have adjusted meds. Ambulate. Has been eating well. Has also tried prune juice. - Small hard BM 2 on 5/8. Palliative team has started Relistor. -Based on recommendation from palliative care we did do a Gastrografin study which revealed reasonable results with stool passage 11/06/2015 -Continue sorbitol and replace that for MiraLAX Y These were then on Sunday she had an mother is unlikely that the last visit the patient is in the with family history is significant for days before he is in generally good  Elevated blood pressure - Secondary to pain issues. Not on blood pressure medications. Monitor.   DVT prophylaxis: IV heparin drip-->Xarelto Code Status: Full Family Communication: Discussed with patient's mother at bedside. Disposition Plan:  DC home - possibly 5/11  if consitpation and othe rissues resovling   Consultants:   Interventional radiology  Palliative care medicine  Oncology  Procedures:   Bilateral lower extremity venous Dopplers 10/30/15:  Vascular Ultrasound Lower extremity venous duplex has been completed. Preliminary findings: DVT noted in the Right peroneal veins, Left posterior tibial veins, and Left peroneal veins. Superficial thrombosis noted in the left small saphenous vein.   Liver biopsy 10/31/15  Port-A-Cath placed 5/8 by IR.  Antimicrobials:   None    Subjective:  She states her abdominal pain is 5/10 It is better with having passed 1 stool with Gastrografin No other issues currently Eating somewhat  Objective:  Filed Vitals:   11/05/15 1502 11/05/15 1743 11/05/15 2240 11/06/15 0628  BP: 140/88  131/94 138/91  Pulse: 97  117 100  Temp: 98.3 F (36.8 C) 98 F (36.7 C) 98.2 F (36.8 C) 98.4 F (36.9 C)  TempSrc:  Oral    Resp: 19  16 16   Height:      Weight:      SpO2: 97%  95% 98%    Intake/Output Summary (Last 24 hours) at 11/06/15 1659 Last data filed at 11/06/15 0631  Gross per 24 hour  Intake    360 ml  Output      0 ml  Net    360 ml   Filed Weights   10/28/15 2012  Weight: 88.315 kg (194 lb 11.2 oz)    Examination:  General exam: Pleasant young male seen ambulating in the room. Respiratory system: Clear to auscultation. Respiratory effort normal. Right upper chest Port-A-Cath site clean and dry. Cardiovascular system: S1 & S2 heard, RRR.Marland Kitchen No JVD, murmurs, rubs, gallops or clicks. No pedal edema. Gastrointestinal system: Abdomen is non distended, soft and nontender. No organomegaly or masses felt. Normal bowel sounds heard.  Central nervous system: Alert and oriented. No focal neurological deficits. Extremities: Symmetric 5 x 5 power.Left ankle and foot minimally swollen but significantly improved compared to last week. Good peripheral pulses felt. Skin: No  rashes, lesions or ulcers Psychiatry: Judgement and insight appear normal. Mood & affect appropriate.     Data Reviewed: I have personally reviewed following labs and imaging studies  CBC:  Recent Labs Lab 11/02/15 0355 11/03/15 0614 11/04/15 0532 11/05/15 0747 11/06/15 0536  WBC 10.2 10.2 11.7* 14.3* 13.5*  HGB 10.1* 10.0* 10.6* 11.5* 11.3*  HCT 31.8* 31.4* 32.4* 35.2* 35.4*  MCV 85.0 83.5 84.8 84.0 84.7  PLT 289 329 357 297 [id number]   Basic Metabolic Panel:  Recent Labs Lab 10/31/15 0207 11/05/15 0747  NA 134* 134*  K 3.9 4.4  CL 99* 96*  CO2 23 25  GLUCOSE 118* 118*  BUN 6 7  CREATININE 0.89 0.80  CALCIUM 8.8* 9.8   GFR: Estimated Creatinine Clearance: 126 mL/min (by C-G formula based on Cr of 0.8). Liver Function Tests:  Recent Labs Lab 10/31/15 0207 11/05/15 0747  AST 36 31  ALT 32 34  ALKPHOS 155* 273*  BILITOT 0.8 1.1  PROT 6.3* 7.6  ALBUMIN 2.7* 2.9*   No results for input(s): LIPASE, AMYLASE in the last 168 hours. No results for input(s): AMMONIA in the last 168 hours. Coagulation Profile:  Recent Labs Lab 10/31/15 0207  INR 1.37   Cardiac Enzymes: No results for input(s): CKTOTAL, CKMB, CKMBINDEX, TROPONINI in the last 168 hours. BNP (last 3 results) No results for input(s): PROBNP in the last 8760 hours. HbA1C: No results for input(s): HGBA1C in the last 72 hours. CBG:  No results for input(s): GLUCAP in the last 168 hours. Lipid Profile: No results for input(s): CHOL, HDL, LDLCALC, TRIG, CHOLHDL, LDLDIRECT in the last 72 hours. Thyroid Function Tests: No results for input(s): TSH, T4TOTAL, FREET4, T3FREE, THYROIDAB in the last 72 hours. Anemia Panel: No results for input(s): VITAMINB12, FOLATE, FERRITIN, TIBC, IRON, RETICCTPCT in the last 72 hours. Urine analysis:    Component Value Date/Time   COLORURINE AMBER* 10/28/2015 [place] 10/28/2015 1313   LABSPEC 1.027 10/28/2015 1313   PHURINE 6.0 10/28/2015 1313    GLUCOSEU NEGATIVE 10/28/2015 1313   HGBUR MODERATE* 10/28/2015 1313   BILIRUBINUR SMALL* 10/28/2015 1313   KETONESUR 15* 10/28/2015 1313   PROTEINUR 30* 10/28/2015 1313   NITRITE NEGATIVE 10/28/2015 1313   LEUKOCYTESUR NEGATIVE 10/28/2015 1313    Radiology Studies: Dg Colon W/water Sol Cm  11/06/2015  CLINICAL DATA:  Metastatic pancreatic cancer.  Severe constipation. EXAM: COLON WITH WATER SOLUTION CONTRAST COMPARISON:  CT scan 10/28/2015. FINDINGS: Initial scout image demonstrates a large amount of air throughout the colon. There is also some stool in the right and transverse colon. Possible colonic ileus. No intrinsic or extrinsic lesions of the colon are identified. Mild to moderate dilatation of the ascending and transverse colon. IMPRESSION: Moderate stool in a dilated right and transverse colon. Air throughout the rest of the colon. No obstruction or mass. Electronically Signed   By: Marijo Sanes M.D.   On: 11/06/2015 15:42     Scheduled Meds: . diatrizoate meglumine-sodium      . feeding supplement  1 Container Oral TID BM  . oxyCODONE  30 mg Oral Q12H  . Rivaroxaban  15 mg Oral BID WC  . senna-docusate  2 tablet Oral BID  . sorbitol  30 mL Oral BID   Continuous Infusions:     LOS: 7 days    Time spent: 20 minutes.   Verneita Griffes, MD [facility] [phone]

## 2015-11-06 NOTE — Progress Notes (Signed)
Daily Progress Note   Patient Name: Nathan [name]       Date: 11/06/2015 DOB: 06/19/1971  Age: 45 y.o. MRN#: [id number] Attending Physician: Nita Sells, MD Primary Care Physician: No primary care provider on file. Admit Date: 10/28/2015  Reason for Consultation/Follow-up: Pain control  Subjective: Awake alert, sitting up in bed, Sister at bedside.  Pain in leg, abdomen. He desires to avoid IV opioids if at all possible. He has been having improvement in pain on current regimen of OxyContin 30 mg PO BID and oxycodone 10 Q3hrs, but still with average pain score of 5/10.  Have been working to expand bowel regimen, but reports last BM 1 week ago.  Has been getting miralax, senna, multiple suppositories and enemas over the past few days.  No effect with second dose of Relistor.  We talked again about his bowel regimen at length. We also talked about plan for possible Gastrografin enema to relieve constipation while also ensuring no other obstructive process.  Length of Stay: 7  Current Medications: Scheduled Meds:  . diatrizoate meglumine-sodium      . feeding supplement  1 Container Oral TID BM  . oxyCODONE  30 mg Oral Q12H  . Rivaroxaban  15 mg Oral BID WC  . senna-docusate  2 tablet Oral BID  . sorbitol  30 mL Oral BID    Continuous Infusions:    PRN Meds: acetaminophen **OR** acetaminophen, ALPRAZolam, calcium carbonate, menthol-cetylpyridinium, morphine injection, ondansetron **OR** ondansetron (ZOFRAN) IV, oxyCODONE  Physical Exam         Awake alert Pain in leg and abdomen S1 S2 Clear Abdomen soft mildly distended No edema Non focal   Vital Signs: BP 138/91 mmHg  Pulse 100  Temp(Src) 98.4 F (36.9 C) (Oral)  Resp 16  Ht 5' 10.8" (1.798 m)  Wt 88.315 kg (194  lb 11.2 oz)  BMI 27.32 kg/m2  SpO2 98% SpO2: SpO2: 98 % O2 Device: O2 Device: Not Delivered O2 Flow Rate: O2 Flow Rate (L/min): 2 L/min  Intake/output summary:   Intake/Output Summary (Last 24 hours) at 11/06/15 1931 Last data filed at 11/06/15 0631  Gross per 24 hour  Intake    360 ml  Output      0 ml  Net    360 ml   LBM: Last BM Date:  11/04/15 Baseline Weight: Weight: 88.315 kg (194 lb 11.2 oz) Most recent weight: Weight: 88.315 kg (194 lb 11.2 oz)       Palliative Assessment/Data:    Flowsheet Rows        Most Recent Value   Intake Tab    Referral Department  Hospitalist   Unit at Time of Referral  Oncology Unit   Palliative Care Primary Diagnosis  Cancer   Palliative Care Type  Return patient Palliative Care   Reason for referral  Pain, Non-pain Symptom, Clarify Goals of Care   Date first seen by Palliative Care  10/29/15   Clinical Assessment    Palliative Performance Scale Score  40%   Pain Max last 24 hours  8   Pain Min Last 24 hours  5   Psychosocial & Spiritual Assessment    Palliative Care Outcomes    Patient/Family meeting held?  Yes   Who was at the meeting?  patient, girlfriend, Dr Algis Liming    Palliative Care Outcomes  Clarified goals of care, Improved pain interventions   Palliative Care follow-up planned  Yes, Home   Palliative Care Follow-up Reason  Pain, Clarify goals of care      Patient Active Problem List   Diagnosis Date Noted  . Constipation   . Acute pulmonary embolism ([place]) 10/31/2015  . Acute deep vein thrombosis (DVT) of lower extremity ([place]) 10/31/2015  . Left ankle pain   . 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 Encounter for palliative care   . Goals of care, counseling/discussion   . Upper abdominal pain 10/28/2015  . Weight loss 10/28/2015    Palliative Care Assessment & Plan   Patient Profile:  45 yo with recently diagnosed possible metastatic pancreatic cancer.   Assessment: Cancer pancreas metastatic  to liver DVT PE Uncontrolled pain Uncontrolled anxiety.   Recommendations/Plan:  Pain: Reports better control Overall since increasing regimen. Recommend continue oxycontin 30mg  BID.  Also recommend continue PRN dose oxycodone 10mg  Q3hrs prn.   Constipation: Severe, Opioid Induced.  He received 2 doses without BM.  Continues to have severe, refractory constipation despite aggressive oral and rectal regimen.  Recommend Gastrografin enema. After talking with him again today, he reports he was having constipation even prior to initiation of narcotic medications. His metastatic disease, I do wonder if there is possibility of carcinomatosis playing a role in this as well.  Goals of Care and Additional Recommendations:  Limitations on Scope of Treatment: Full Scope Treatment  Code Status:    Code Status Orders        Start     Ordered   10/28/15 1824  Full code   Continuous     10/28/15 1823    Code Status History    Date Active Date Inactive Code Status Order ID Comments User Context   This patient has a current code status but no historical code status.       Prognosis:   Unable to determine  Discharge Planning:  Home with [place] was discussed with  Patient, mother,   Thank you for allowing the Palliative Medicine Team to assist in the care of this patient.   Time In: 0940 Time Out: 1020 Total Time 40 Prolonged Time Billed  no       Greater than 50%  of this time was spent counseling and coordinating care related to the above assessment and plan.  Micheline Rough, MD [phone]  Please contact  Palliative Medicine Team phone at [phone] for questions and concerns.

## 2015-11-06 NOTE — Care Management Note (Signed)
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 Pt is without  Insurance, no PCP.   Action/Plan: Discharge to home today. Pt is to f/u with the [facility] to help establish PCP and assist with medication needs, refer to AVS for appointment time.   Expected Discharge Date:   11/06/2015           Expected Discharge Plan:  Home/Self Care  In-House Referral:  Financial Counselor  Discharge planning Services  CM Consult, Medication Assistance, Follow-up appt scheduled, [facility]  Post Acute Care Choice:    Choice offered to:     DME Arranged:    DME Agency:     HH Arranged:    Friendly:     Status of Service:  completed  Medicare Important Message Given:    Date Medicare IM Given:    Medicare IM give by:    Date Additional Medicare IM Given:    Additional Medicare Important Message give by:     If discussed at [place] of Stay Meetings, dates discussed:    Additional Comments:  CM provided pt with [place] information. Pt is to d/c on XARELTO, 30 DAY free card given. CM explained to pt to take the 30 day free card [facility]) to [place] and give to pharmacist when picking up filled prescription.   Whitman Hero Hunter, [place], [place] [phone] 11/06/2015, 7:19 AM

## 2015-11-07 ENCOUNTER — Other Ambulatory Visit: Payer: Self-pay | Admitting: *Deleted

## 2015-11-07 LAB — CBC
HCT: 35.4 % — ABNORMAL LOW (ref 39.0–52.0)
Hemoglobin: 11.4 g/dL — ABNORMAL LOW (ref 13.0–17.0)
MCH: 27.3 pg (ref 26.0–34.0)
MCHC: 32.2 g/dL (ref 30.0–36.0)
MCV: 84.9 fL (ref 78.0–100.0)
PLATELETS: 242 10*3/uL (ref 150–400)
RBC: 4.17 MIL/uL — AB (ref 4.22–5.81)
RDW: 14.6 % (ref 11.5–15.5)
WBC: 14.3 10*3/uL — AB (ref 4.0–10.5)

## 2015-11-07 MED ORDER — OXYCODONE HCL 10 MG PO TABS: 10.0000 mg | ORAL_TABLET | ORAL | Status: DC | PRN

## 2015-11-07 MED ORDER — OXYCODONE HCL ER 30 MG PO T12A: 30.0000 mg | EXTENDED_RELEASE_TABLET | Freq: Two times a day (BID) | ORAL | Status: DC

## 2015-11-07 MED ORDER — SORBITOL 70 % SOLN: 40.0000 mL | Freq: Two times a day (BID) | Status: AC

## 2015-11-07 MED ORDER — RIVAROXABAN (XARELTO) VTE STARTER PACK (15 & 20 MG): ORAL_TABLET | ORAL | Status: DC

## 2015-11-07 MED ORDER — RIVAROXABAN 15 MG PO TABS: 15.0000 mg | ORAL_TABLET | Freq: Two times a day (BID) | ORAL | Status: DC

## 2015-11-07 MED ORDER — ALPRAZOLAM 0.5 MG PO TABS: 0.5000 mg | ORAL_TABLET | Freq: Three times a day (TID) | ORAL | Status: DC | PRN

## 2015-11-07 MED ORDER — SENNOSIDES-DOCUSATE SODIUM 8.6-50 MG PO TABS: 2.0000 | ORAL_TABLET | Freq: Two times a day (BID) | ORAL | Status: AC

## 2015-11-07 MED ORDER — RIVAROXABAN (XARELTO) EDUCATION KIT FOR DVT/PE PATIENTS: 1.0000 | PACK | Freq: Once | Status: DC

## 2015-11-07 MED FILL — **XARELTO 15 MG TABLET: 15 MG | 21 days supply | Qty: 42 | Fill #0

## 2015-11-07 NOTE — Discharge Summary (Addendum)
Physician Discharge Summary  Nathan [name] LHT:[id number] DOB: 25-Jan-1971 DOA: 10/28/2015  PCP: No primary care provider on file.  Admit date: 10/28/2015 Discharge date: 11/07/2015  Time spent: 35 minutes  Recommendations for Outpatient Follow-up:  1. I will carbon copy patient's oncologist Dr. Alvy Bimler on this note 2. Patient has been given a much R in order for him to obtain all of his medications and I have given hard scripts to case manager to coordinate this 3. Patient is stable for discharge home and will need close coordination is now outpatient with oncology regarding treatment of his pancreatic cancer 4. I would recommend LFTs, CBC and differential as well as INR in about one week 5. 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    1 month history of upper abdominal pain radiating to back, 30 pound weight loss over the last couple of months, decreased appetite.   CT abdomen and pelvis suggested pancreatic cancer with metastasis to liver, spleen and both kidneys.   Interventional radiology consulted and plan ultrasound-guided biopsy on 5/4. Palliative care consulted for goals of care. CTA chest confirmed bilateral PE. Lower extremity venous Dopplers confirmed bilateral DVT.   Started on IV heparin per pharmacy.   Oncology consulted. Status post liver biopsy on 5/4. S/P Port-A-Cath on  5/8. Pathology confirmed metastatic adenocarcinoma, likely pancreaticobiliary primary.   Transitioned to Xarelto 5/9.     Hospital Course:  Metastatic pancreatic cancer - CA 19-9: [phone] - s/p Liver biopsy: Pathology consistent with metastatic adenocarcinoma, most likely pancreaticobiliary primary. - Palliative care following closely and adjusting pain medications. Pain not adequately controlled. - s/p Port-A-Cath placement by IR on 5/8. - Oncology has arranged outpatient follow-up to start chemotherapy on 11/11/15.  Liver Biopsy results Diagnosis Liver, needle/core biopsy, right hepatic lobe - POSITIVE FOR METASTATIC ADENOCARCINOMA. - SEE COMMENT. Microscopic Comment Immunohistochemical stains are performed. The tumor is positive for cytokeratin 7 and shows focal positivity for cytokeratin 20. The tumor is negative for CDX-2, TTF-1, and prostein. The staining pattern coupled with the morphologyis compatible with metastatic adenocarcinoma of a pancreatobiliary primary source, especially given the presence of a large pancreatic mass lesion.   Acute bilateral pulmonary embolism and bilateral lower extremity DVT's - CTA chest 5/3 confirmed bilateral acute PE without right heart strain. Bilateral lower extremity venous Doppler confirms DVT in bilateral lower extremities. - Patient was initially placed on IV heparin infusion. His heparin infusion was temporarily held for procedures i.e. liver biopsy and Port-A-Cath placement.   - Transitioned to Xarelto on 5/9. Will need to transition to 20 mg dosing on 11/19/15  Anemia of chronic disease - Stable. Follow CBCs periodically.  Left ankle and foot pain - No history of trauma.   -X-rays without acute findings.?   -Related to DVT versus other musculoskeletal skeletal etiology. - Pain management. K pad. Improved.  Opioid-induced refractory Constipation - Palliative team have adjusted meds. Ambulate. Has been eating well. Has also tried prune  juice. - Small hard BM 2 on 5/8. Palliative team has started Relistor. -Based on recommendation from palliative care we did do a Gastrografin study which revealed reasonable results with stool passage 11/06/2015 -Continue sorbitol as an outpatient 40 mils twice a day  Elevated blood pressure - Secondary to pain issues. Not on blood pressure medications. Monitor.   DVT prophylaxis: IV heparin drip-->Xarelto Code Status: Full Family Communication: Discussed with patient's mother at bedside. Disposition Plan: DC home - possibly 5/11 if consitpation and othe rissues resovling   Consultants:   Interventional radiology  Palliative care medicine  Oncology  Procedures:   Bilateral lower extremity venous Dopplers 10/30/15:  Vascular Ultrasound Lower extremity venous duplex has been completed.  Preliminary findings: DVT noted in the Right peroneal veins, Left posterior tibial veins, and Left peroneal veins. Superficial thrombosis noted in the left small saphenous vein.     Liver biopsy 10/31/15  Port-A-Cath placed 5/8 by IR.   Discharge Exam: Filed Vitals:   11/06/15 2151 11/07/15 0618  BP: 108/62 132/87  Pulse: 120 107  Temp: 99 F (37.2 C) 98.4 F (36.9 C)  Resp: 18 18    General exam: Pleasant young male slightly sleepy Respiratory system: Clear to auscultation. Respiratory effort normal. Right upper chest Port-A-Cath site clean and dry. Cardiovascular system: S1 & S2 heard, RRR.Marland Kitchen No JVD, murmurs, rubs, gallops or clicks. No pedal edema. Gastrointestinal system: Abdomen is non distended, soft and nontender. No organomegaly or masses felt. Normal bowel sounds heard.     Discharge Instructions   Discharge Instructions    Diet - low sodium heart healthy    Complete by:  As directed      Discharge instructions    Complete by:  As directed   Please follow-up with your regular oncologist for further care  they will contact you with plans going forward with regards to  further options for your therapy You will also need education's for pain control and constipation and I have prescribed these and we are working on getting them to you     Increase activity slowly    Complete by:  As directed           Current Discharge Medication List    START taking these medications   Details  ALPRAZolam (XANAX) 0.5 MG tablet Take 1 tablet (0.5 mg total) by mouth 3 (three) times daily as needed for anxiety (try first dose now if patient thinks he needs it. thank you). Qty: 75 tablet, Refills: 0    oxyCODONE 10 MG TABS Take 1 tablet (10 mg total) by mouth every 3 (three) hours as needed for moderate pain, severe pain or breakthrough pain. Qty: 60 tablet, Refills: 0    oxyCODONE 30 MG 12 hr tablet Take 30 mg by mouth every 12 (twelve) hours. Qty: 60 tablet, Refills: 0    Rivaroxaban (XARELTO STARTER PACK) 15 & 20 MG TBPK Take as directed on package: Start with one 21m tablet by mouth twice a day with food. On Day 22, switch to one 29mtablet once a day with food. Qty: 51 each, Refills: 0    Rivaroxaban (XARELTO) 15 MG TABS tablet Take 1 tablet (15 mg total) by mouth 2 (two) times daily with a meal. Qty: 42 tablet, Refills: 0    rivaroxaban (XARELTO) KIT 1 kit by Does not apply route once. Qty: 1 kit, Refills:  0    senna-docusate (SENOKOT-S) 8.6-50 MG tablet Take 2 tablets by mouth 2 (two) times daily. Qty: 120 tablet, Refills: 0    sorbitol 70 % SOLN Take 40 mLs by mouth 2 (two) times daily. Qty: 473 mL, Refills: 1      STOP taking these medications     ibuprofen (ADVIL,MOTRIN) 200 MG tablet        No Known Allergies Follow-up Information    Follow up with [facility] On 11/08/2015.   Why:  Hospital follow-up appointment on 11/06/15 at 4:30 pm with Zettie Pho NP.   Contact information:   [address] [place] [address] [phone]       The results of significant diagnostics from this  hospitalization (including imaging, microbiology, ancillary and laboratory) are listed below for reference.    Significant Diagnostic Studies: Dg Ankle Complete Left  11/25/15  CLINICAL DATA:  Left ankle pain for 3 days.  No known injury. EXAM: LEFT ANKLE COMPLETE - 3+ VIEW COMPARISON:  None. FINDINGS: There is no evidence of fracture, dislocation, or joint effusion. Minimal degenerative spurring is seen along the inferior aspect of the medial malleolus in the anterior margin of the distal tibia. No evidence of joint space narrowing. Soft tissues are unremarkable. IMPRESSION: No acute findings.  Mild degenerative spurring. Electronically Signed   By: Earle Gell M.D.   On: Nov 25, 2015 12:20   Ct Chest W Contrast  10/29/2015  CLINICAL DATA:  Chest pain for 1 day EXAM: CT CHEST WITH CONTRAST TECHNIQUE: Multidetector CT imaging of the chest was performed during intravenous contrast administration. CONTRAST:  38m ISOVUE-300 IOPAMIDOL (ISOVUE-300) INJECTION 61% COMPARISON:  None FINDINGS: The lungs are well aerated bilaterally. No focal infiltrate or sizable effusion is seen. No focal parenchymal nodule is noted. No sizable effusion or pneumothorax is noted. The thoracic inlet is within normal limits. The thoracic aorta and its branches are unremarkable. Pulmonary artery is incompletely evaluated due to timing of the contrast bolus. No significant hilar or mediastinal adenopathy is noted. Scanning into the upper abdomen demonstrates multiple hypodense lesions throughout the liver consistent with the patient's given clinical history of metastatic pancreas carcinoma to the liver. These are stable from a CT from the previous day. A few small hypodensities are also noted within the spleen also suggestive of metastatic disease. Persistent mass in the tail of the pancreas is noted. These changes were better evaluated on the recent CT examination of the abdomen and pelvis. No acute bony abnormality is seen. IMPRESSION:  Changes consistent with pancreatic mass and metastatic disease as seen on the previous CT examination. The timing of the contrast bolus limits evaluation for potential pulmonary embolus. When compared with the exam from the previous day, however there are findings suggestive of pulmonary embolism. No other intrathoracic abnormality is noted. These results were called by telephone at the time of interpretation on 10/29/2015 at 6:21 pm to [place]roll[place] Springsthe pts nurse, who verbally acknowledged these results. Electronically Signed   By: MInez CatalinaM.D.   On: 10/29/2015 18:21   Ct Angio Chest Pe W/cm &/or Wo Cm  [id number] CLINICAL DATA:  45year old male with new imaging diagnosis of metastatic pancreatic carcinoma, 10/28/2015. Chest CT 10/29/2015 suggests pulmonary embolism. EXAM: CT ANGIOGRAPHY CHEST WITH CONTRAST TECHNIQUE: Multidetector CT imaging of the chest was performed using the standard protocol during bolus administration of intravenous contrast. Multiplanar CT image reconstructions and MIPs were obtained to evaluate the vascular anatomy. CONTRAST:  100 cc Isovue 370 COMPARISON:  Abdominal CT 10/28/2015, chest CT 10/29/2015 FINDINGS: Chest: Unremarkable appearance of the chest superficial soft tissues. No axillary or supraclavicular adenopathy. Unremarkable appearance of the thoracic inlet, including the visualized thyroid. Mediastinal lymph nodes are present, including AP window, paratracheal, and sub carinal. None of these are significantly enlarged. Pericardial lymph nodes in the drainage pathway of the liver Unremarkable appearance of the esophagus. Unremarkable course caliber and contour of the thoracic aorta without dissection flap, aneurysm, periaortic fluid. Left-sided filling defects involving segmental and subsegmental branches of the left lower lobe. Subsegmental filling defects of the right lower lobe. Associated atelectatic changes, without consolidation. Ratio of right ventricle to left ventricle  measures less than 1. No pleural effusion. No pneumothorax. No confluent airspace disease. Upper abdomen: Multiple metastatic foci of the liver again noted. Partially visualized mass involving the tail of the pancreas along the greater curvature of the stomach, incompletely imaged. Musculoskeletal: No displaced fracture. Review of the MIP images confirms the above findings. IMPRESSION: Study is positive for pulmonary emboli, involving left lower lobe segmental and subsegmental branches, as well as subsegmental branches of the right lower lobe. No evidence of right-sided heart strain. Re- demonstration of imaging pattern compatible with metastatic pancreatic carcinoma, better characterized on prior CT abdomen. Suspicious lymph nodes in the pericardial nodal stations. These results were called by telephone at the time of interpretation on 10/30/2015 at 9:18 am to the nurse caring for the patient, Ms. Larose Kells, who verbally acknowledged these results. Signed, Dulcy Fanny. Earleen Newport, DO [facility] Electronically Signed   By: Corrie Mckusick D.O.   On: 10/30/2015 09:20   Ct Abdomen Pelvis W Contrast  10/28/2015  CLINICAL DATA:  Epigastric pain for 1 month. EXAM: CT ABDOMEN AND PELVIS WITH CONTRAST TECHNIQUE: Multidetector CT imaging of the abdomen and pelvis was performed using the standard protocol following bolus administration of intravenous contrast. CONTRAST:  100 cc ISOVUE-300 IOPAMIDOL (ISOVUE-300) INJECTION 61% COMPARISON:  None. FINDINGS: Lower chest:  Normal. Hepatobiliary: Innumerable metastatic lesions throughout the liver. The dominant lesions are a 4.4 cm lesion in the dome of the right lobe and a 5.5 cm lesion in the lateral aspect. The lesions involve all segments of the liver. No biliary ductal dilatation. Pancreas: There is a inhomogeneous 6.1 x 4.0 x 5.4 cm tumor in the tail of the pancreas invading the posterior wall of the stomach. The head and  body of the pancreas are normal. Spleen: Ill-defined 19 mm lesion in the anterior aspect of the spleen. There are 2 smaller lesions in the spleen. These are worrisome for metastatic disease. Adrenals/Urinary Tract: There are several vague areas of low-density in each kidney, worrisome for metastatic disease. Benign appearing 25 mm cyst on the lower pole of the left kidney. No hydronephrosis. The bladder is normal. Stomach/Bowel: The bowel is normal including the terminal ileum and appendix except for a tiny fecalith in the appendix. There is a small midline anterior abdominal wall hernia through the linea alba containing omentum including some omental vessels. This hernia as above the umbilicus. Vascular/Lymphatic: Normal. Reproductive: Normal. Other: No free air or free fluid. Musculoskeletal: Normal. IMPRESSION: Metastatic pancreatic carcinoma. There are metastases to the liver, spleen and both kidneys. Anterior abdominal wall hernia containing omentum. Electronically Signed   By: Lorriane Shire M.D.   On: 10/28/2015 14:15   US Biopsy  11/01/2015  INDICATION: 45 year old with pancreatic lesion and multiple liver lesions. Tissue diagnosis is needed. EXAM: ULTRASOUND-GUIDED  LIVER LESION BIOPSY MEDICATIONS: None. ANESTHESIA/SEDATION: Moderate (conscious) sedation was employed during this procedure. A total of Versed 4.0 mg and Fentanyl 100 mcg was administered intravenously. Moderate Sedation Time: 23 minutes. The patient's level of consciousness and vital signs were monitored continuously by radiology nursing throughout the procedure under my direct supervision. FLUOROSCOPY TIME:  None COMPLICATIONS: None immediate. PROCEDURE: The procedure was explained to the patient. The risks and benefits of the procedure were discussed and the patient's questions were addressed. Informed consent was obtained from the patient. Liver was evaluated with ultrasound. Patient has known multiple lesions throughout the liver but  these are poorly characterized with ultrasound. Most lesions are vague hyperechoic areas. A relatively well-defined nodular lesion in the right hepatic lobe was targeted. The right side of the abdomen was prepped with chlorhexidine and a sterile field was created. The skin and soft tissues were anesthetized with 1% lidocaine. 17 gauge needle directed into the lesion. A total of 3 core biopsies were obtained with an 18 gauge device. Specimens were placed in formalin. Gel-Foam was placed through the 17 gauge needle during removal. Bandage placed over the puncture site. Ultrasound images were taken and saved for this procedure. FINDINGS: Vague hyperechoic areas throughout the liver consistent with known lesions. Relatively well-defined 1 cm lesion in the right hepatic lobe just below the capsule was sampled. No significant bleeding following the core biopsies. Biopsy needle was confirmed within this lesion during the procedure. IMPRESSION: Ultrasound-guided core biopsies of a right hepatic lesion. Electronically Signed   By: Markus Daft M.D.   On: 11/01/2015 08:11   Ir Fluoro Guide Cv Line Right  11/04/2015  CLINICAL DATA:  Metastatic pancreas cancer EXAM: RIGHT INTERNAL JUGULAR SINGLE LUMEN POWER PORT CATHETER INSERTION Date:  5/8/20175/01/2016 12:29 pm Radiologist:  M. Daryll Brod, MD Guidance:  Ultrasound and fluoroscopic MEDICATIONS: 2 g Ancef; The antibiotic was administered within an appropriate time interval prior to skin puncture. ANESTHESIA/SEDATION: Versed 1.5 mg IV; Fentanyl 75 mcg IV; Moderate Sedation Time:  35 minutes The patient was continuously monitored during the procedure by the interventional radiology nurse under my direct supervision. FLUOROSCOPY TIME:  30 seconds (2 mGy) COMPLICATIONS: None immediate. CONTRAST:  None. PROCEDURE: Informed consent was obtained from the patient following explanation of the procedure, risks, benefits and alternatives. The patient understands, agrees and consents for  the procedure. All questions were addressed. A time out was performed. Maximal barrier sterile technique utilized including caps, mask, sterile gowns, sterile gloves, large sterile drape, hand hygiene, and 2% chlorhexidine scrub. Under sterile conditions and local anesthesia, right internal jugular micropuncture venous access was performed. Access was performed with ultrasound. Images were obtained for documentation of the patent right internal jugular vein. A guide wire was inserted followed by a transitional dilator. This allowed insertion of a guide wire and catheter into the IVC. Measurements were obtained from the SVC / RA junction back to the right IJ venotomy site. In the right infraclavicular chest, a subcutaneous pocket was created over the second anterior rib. This was done under sterile conditions and local anesthesia. 1% lidocaine with epinephrine was utilized for this. A 2.5 cm incision was made in the skin. Blunt dissection was performed to create a subcutaneous pocket over the right pectoralis major muscle. The pocket was flushed with saline vigorously. There was adequate hemostasis. The port catheter was assembled and checked for leakage. The port catheter was secured in the pocket with two retention sutures. The tubing was tunneled subcutaneously to the right venotomy  site and inserted into the SVC/RA junction through a valved peel-away sheath. Position was confirmed with fluoroscopy. Images were obtained for documentation. The patient tolerated the procedure well. No immediate complications. Incisions were closed in a two layer fashion with 4 - 0 Vicryl suture. Dermabond was applied to the skin. The port catheter was accessed, blood was aspirated followed by saline and heparin flushes. Needle was removed. A dry sterile dressing was applied. IMPRESSION: Ultrasound and fluoroscopically guided right internal jugular single lumen power port catheter insertion. Tip in the SVC/RA junction. Catheter ready  for use. Electronically Signed   By: Jerilynn Mages.  Shick M.D.   On: 11/04/2015 12:55   Ir US Guide Vasc Access Right  11/04/2015  CLINICAL DATA:  Metastatic pancreas cancer EXAM: RIGHT INTERNAL JUGULAR SINGLE LUMEN POWER PORT CATHETER INSERTION Date:  5/8/20175/01/2016 12:29 pm Radiologist:  M. Daryll Brod, MD Guidance:  Ultrasound and fluoroscopic MEDICATIONS: 2 g Ancef; The antibiotic was administered within an appropriate time interval prior to skin puncture. ANESTHESIA/SEDATION: Versed 1.5 mg IV; Fentanyl 75 mcg IV; Moderate Sedation Time:  35 minutes The patient was continuously monitored during the procedure by the interventional radiology nurse under my direct supervision. FLUOROSCOPY TIME:  30 seconds (2 mGy) COMPLICATIONS: None immediate. CONTRAST:  None. PROCEDURE: Informed consent was obtained from the patient following explanation of the procedure, risks, benefits and alternatives. The patient understands, agrees and consents for the procedure. All questions were addressed. A time out was performed. Maximal barrier sterile technique utilized including caps, mask, sterile gowns, sterile gloves, large sterile drape, hand hygiene, and 2% chlorhexidine scrub. Under sterile conditions and local anesthesia, right internal jugular micropuncture venous access was performed. Access was performed with ultrasound. Images were obtained for documentation of the patent right internal jugular vein. A guide wire was inserted followed by a transitional dilator. This allowed insertion of a guide wire and catheter into the IVC. Measurements were obtained from the SVC / RA junction back to the right IJ venotomy site. In the right infraclavicular chest, a subcutaneous pocket was created over the second anterior rib. This was done under sterile conditions and local anesthesia. 1% lidocaine with epinephrine was utilized for this. A 2.5 cm incision was made in the skin. Blunt dissection was performed to create a subcutaneous pocket  over the right pectoralis major muscle. The pocket was flushed with saline vigorously. There was adequate hemostasis. The port catheter was assembled and checked for leakage. The port catheter was secured in the pocket with two retention sutures. The tubing was tunneled subcutaneously to the right venotomy site and inserted into the SVC/RA junction through a valved peel-away sheath. Position was confirmed with fluoroscopy. Images were obtained for documentation. The patient tolerated the procedure well. No immediate complications. Incisions were closed in a two layer fashion with 4 - 0 Vicryl suture. Dermabond was applied to the skin. The port catheter was accessed, blood was aspirated followed by saline and heparin flushes. Needle was removed. A dry sterile dressing was applied. IMPRESSION: Ultrasound and fluoroscopically guided right internal jugular single lumen power port catheter insertion. Tip in the SVC/RA junction. Catheter ready for use. Electronically Signed   By: Jerilynn Mages.  Shick M.D.   On: 11/04/2015 12:55   Dg Abd Portable 1v  11/03/2015  CLINICAL DATA:  Right upper quadrant pain and constipation. Pancreatic carcinoma. EXAM: PORTABLE ABDOMEN - 1 VIEW COMPARISON:  CT abdomen and pelvis Oct 28, 2015 FINDINGS: There is moderate stool throughout the colon. There is no bowel dilatation or  air-fluid level suggesting obstruction. No free air. Small phlebolith in left pelvis. IMPRESSION: No bowel obstruction or free air. Moderate stool throughout colon. Colon does not appear distended with stool. Electronically Signed   By: Lowella Grip III M.D.   On: 11/03/2015 10:13   Dg Foot Complete Left  10/30/2015  CLINICAL DATA:  Left foot pain for 3 days.  No known injury. EXAM: LEFT FOOT - COMPLETE 3+ VIEW COMPARISON:  None. FINDINGS: There is no evidence of fracture or dislocation. There is no evidence of arthropathy or other focal bone abnormality. IMPRESSION: Negative. Electronically Signed   By: Earle Gell M.D.    On: 10/30/2015 12:24   Dg Colon W/water Sol Cm  11/06/2015  CLINICAL DATA:  Metastatic pancreatic cancer.  Severe constipation. EXAM: COLON WITH WATER SOLUTION CONTRAST COMPARISON:  CT scan 10/28/2015. FINDINGS: Initial scout image demonstrates a large amount of air throughout the colon. There is also some stool in the right and transverse colon. Possible colonic ileus. No intrinsic or extrinsic lesions of the colon are identified. Mild to moderate dilatation of the ascending and transverse colon. IMPRESSION: Moderate stool in a dilated right and transverse colon. Air throughout the rest of the colon. No obstruction or mass. Electronically Signed   By: Marijo Sanes M.D.   On: 11/06/2015 15:42    Microbiology: No results found for this or any previous visit (from the past 240 hour(s)).   Labs: Basic Metabolic Panel:  Recent Labs Lab 11/05/15 0747  NA 134*  K 4.4  CL 96*  CO2 25  GLUCOSE 118*  BUN 7  CREATININE 0.80  CALCIUM 9.8   Liver Function Tests:  Recent Labs Lab 11/05/15 0747  AST 31  ALT 34  ALKPHOS 273*  BILITOT 1.1  PROT 7.6  ALBUMIN 2.9*   No results for input(s): LIPASE, AMYLASE in the last 168 hours. No results for input(s): AMMONIA in the last 168 hours. CBC:  Recent Labs Lab 11/03/15 0614 11/04/15 0532 11/05/15 0747 11/06/15 0536 11/07/15 0647  WBC 10.2 11.7* 14.3* 13.5* 14.3*  HGB 10.0* 10.6* 11.5* 11.3* 11.4*  HCT 31.4* 32.4* 35.2* 35.4* 35.4*  MCV 83.5 84.8 84.0 84.7 84.9  PLT 329 357 297 235 242   Cardiac Enzymes: No results for input(s): CKTOTAL, CKMB, CKMBINDEX, TROPONINI in the last 168 hours. BNP: BNP (last 3 results) No results for input(s): BNP in the last 8760 hours.  ProBNP (last 3 results) No results for input(s): PROBNP in the last 8760 hours.  CBG: No results for input(s): GLUCAP in the last 168 hours.     SignedNita Sells MD   [facility] 11/07/2015, 11:33 AM

## 2015-11-07 NOTE — Progress Notes (Signed)
Daily Progress Note   Patient Name: Nathan [name]       Date: 11/07/2015 DOB: Nov 05, 1970  Age: 45 y.o. MRN#: [id number] Attending Physician: Nita Sells, MD Primary Care Physician: No primary care provider on file. Admit Date: 10/28/2015  Reason for Consultation/Follow-up: Pain control  Subjective: Awake alert, sitting up in bed Reports that he ha good BM following gastrograffin enema and feeling better this AM.  Pain has been well controlled on current regimen and he has used 3 doses rescue medication overnight.  Current pain 4-5/10.  Reports that he feels he is ready to go home if his medical team thinks that this is reasonable.     Length of Stay: 8  Current Medications: Scheduled Meds:  . feeding supplement  1 Container Oral TID BM  . oxyCODONE  30 mg Oral Q12H  . Rivaroxaban  15 mg Oral BID WC  . senna-docusate  2 tablet Oral BID  . sorbitol  30 mL Oral BID    Continuous Infusions:    PRN Meds: acetaminophen **OR** acetaminophen, ALPRAZolam, calcium carbonate, menthol-cetylpyridinium, morphine injection, ondansetron **OR** ondansetron (ZOFRAN) IV, oxyCODONE  Physical Exam         Awake alert Pain in leg and abdomen S1 S2 Clear Abdomen soft mildly distended No edema Non focal   Vital Signs: BP 132/87 mmHg  Pulse 107  Temp(Src) 98.4 F (36.9 C) (Oral)  Resp 18  Ht 5' 10.8" (1.798 m)  Wt 88.315 kg (194 lb 11.2 oz)  BMI 27.32 kg/m2  SpO2 97% SpO2: SpO2: 97 % O2 Device: O2 Device: Not Delivered O2 Flow Rate: O2 Flow Rate (L/min): 2 L/min  Intake/output summary:  No intake or output data in the 24 hours ending 11/07/15 0854 LBM: Last BM Date: 11/06/15 Baseline Weight: Weight: 88.315 kg (194 lb 11.2 oz) Most recent weight: Weight: 88.315 kg (194 lb  11.2 oz)       Palliative Assessment/Data:    Flowsheet Rows        Most Recent Value   Intake Tab    Referral Department  Hospitalist   Unit at Time of Referral  Oncology Unit   Palliative Care Primary Diagnosis  Cancer   Palliative Care Type  Return patient Palliative Care   Reason for referral  Pain, Non-pain Symptom, Clarify Goals of Care  Date first seen by Palliative Care  10/29/15   Clinical Assessment    Palliative Performance Scale Score  40%   Pain Max last 24 hours  8   Pain Min Last 24 hours  5   Psychosocial & Spiritual Assessment    Palliative Care Outcomes    Patient/Family meeting held?  Yes   Who was at the meeting?  patient, girlfriend, Dr Algis Liming    Palliative Care Outcomes  Clarified goals of care, Improved pain interventions   Palliative Care follow-up planned  Yes, Home   Palliative Care Follow-up Reason  Pain, Clarify goals of care      Patient Active Problem List   Diagnosis Date Noted  . Malignant neoplasm of pancreas ([place])   . Constipation   . Acute pulmonary embolism ([place]) 10/31/2015  . Acute deep vein thrombosis (DVT) of lower extremity ([place]) 10/31/2015  . Left ankle pain   . 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 Encounter for palliative care   . Goals of care, counseling/discussion   . Upper abdominal pain 10/28/2015  . Weight loss 10/28/2015    Palliative Care Assessment & Plan   Patient Profile:  45 yo with recently diagnosed possible metastatic pancreatic cancer.   Assessment: Cancer pancreas metastatic to liver DVT PE Uncontrolled pain Uncontrolled anxiety.   Recommendations/Plan:  Pain: Reports better control overall since increasing regimen.  He has used 3 rescue doses in the last 24 hours.  Recommend continue oxycontin 30mg  BID.  Also recommend continue PRN dose oxycodone 10mg  Q3hrs prn.  While I think that his overall needs will continue to increase once he is discharged and more active, this seems to be a  very reasonable regimen for discharge.   Constipation: Severe, Opioid Induced. Report BM following Gastrografin enema.  I do wonder if there is possibility of carcinomatosis playing a role in this in addition to opioids as he reports constipation prior to starting opioid therapy.  Recommend continue laxative regimen on d/c of senna and miralax.  Goals of Care and Additional Recommendations:  Limitations on Scope of Treatment: Full Scope Treatment   I called and placed referral to OP palliative care through [facility] [place].  Code Status:    Code Status Orders        Start     Ordered   10/28/15 1824  Full code   Continuous     10/28/15 1823    Code Status History    Date Active Date Inactive Code Status Order ID Comments User Context   This patient has a current code status but no historical code status.       Prognosis:   Unable to determine  Discharge Planning:  Home with [place] was discussed with  Patient   Thank you for allowing the Palliative Medicine Team to assist in the care of this patient.   Time In: 0850 Time Out: 0915 Total Time 25 Prolonged Time Billed  no       Greater than 50%  of this time was spent counseling and coordinating care related to the above assessment and plan.  Micheline Rough, MD [phone]  Please contact Palliative Medicine Team phone at [phone] for questions and concerns.

## 2015-11-07 NOTE — Progress Notes (Signed)
Nsg Discharge Note  Admit Date:  10/28/2015 Discharge date: 11/07/2015   Margaretmary Bayley to be D/C'd Home per MD order.  AVS completed.  Copy for chart, and copy for patient signed, and dated. Patient/caregiver able to verbalize understanding.  Discharge Medication:   Medication List    STOP taking these medications        ibuprofen 200 MG tablet  Commonly known as:  ADVIL,MOTRIN      TAKE these medications        oxyCODONE 30 MG 12 hr tablet  Take 30 mg by mouth every 12 (twelve) hours.     Rivaroxaban 15 MG Tabs tablet  Commonly known as:  XARELTO  Take 1 tablet (15 mg total) by mouth 2 (two) times daily with a meal.     Rivaroxaban 15 & 20 MG Tbpk  Commonly known as:  XARELTO STARTER PACK  Take as directed on package: Start with one 57m tablet by mouth twice a day with food. On Day 22, switch to one 260mtablet once a day with food.     rivaroxaban Kit  Commonly known as:  XARELTO  1 kit by Does not apply route once.     senna-docusate 8.6-50 MG tablet  Commonly known as:  Senokot-S  Take 2 tablets by mouth 2 (two) times daily.     sorbitol 70 % Soln  Take 40 mLs by mouth 2 (two) times daily.        Discharge Assessment: Filed Vitals:   11/06/15 2151 11/07/15 0618  BP: 108/62 132/87  Pulse: 120 107  Temp: 99 F (37.2 C) 98.4 F (36.9 C)  Resp: 18 18   Skin clean, dry and intact without evidence of skin break down, no evidence of skin tears noted. IV catheter discontinued intact. Site without signs and symptoms of complications - no redness or edema noted at insertion site, patient denies c/o pain - only slight tenderness at site.  Dressing with slight pressure applied.  D/c Instructions-Education: Discharge instructions given to patient/family with verbalized understanding. D/c education completed with patient/family including follow up instructions, medication list, d/c activities limitations if indicated, with other d/c instructions as indicated by MD -  patient able to verbalize understanding, all questions fully answered. Patient instructed to return to ED, call 911, or call MD for any changes in condition.  [place].  ViSalley SlaughterRN 11/07/2015 1:25 PM

## 2015-11-07 NOTE — Progress Notes (Signed)
CM is to provide pt with a Match Letter to help assist with pain medication(narcotics). CM made pt aware.  Whitman Hero RN,BSN,CM [phone]

## 2015-11-08 ENCOUNTER — Telehealth: Payer: Self-pay | Admitting: Hematology and Oncology

## 2015-11-08 ENCOUNTER — Other Ambulatory Visit: Payer: Self-pay | Admitting: Hematology and Oncology

## 2015-11-08 ENCOUNTER — Ambulatory Visit: Payer: Medicaid Other | Attending: Internal Medicine | Admitting: Physician Assistant

## 2015-11-08 ENCOUNTER — Encounter: Payer: Self-pay | Admitting: Physician Assistant

## 2015-11-08 ENCOUNTER — Encounter: Payer: Self-pay | Admitting: Hematology and Oncology

## 2015-11-08 ENCOUNTER — Other Ambulatory Visit: Payer: Self-pay

## 2015-11-08 VITALS — BP 134/83 | HR 145 | Temp 99.5°F | Resp 20 | Ht 69.0 in | Wt 192.4 lb

## 2015-11-08 DIAGNOSIS — C787 Secondary malignant neoplasm of liver and intrahepatic bile duct: Principal | ICD-10-CM

## 2015-11-08 DIAGNOSIS — F329 Major depressive disorder, single episode, unspecified: Secondary | ICD-10-CM | POA: Insufficient documentation

## 2015-11-08 DIAGNOSIS — Z79899 Other long term (current) drug therapy: Secondary | ICD-10-CM | POA: Insufficient documentation

## 2015-11-08 DIAGNOSIS — R000 Tachycardia, unspecified: Secondary | ICD-10-CM | POA: Diagnosis present

## 2015-11-08 DIAGNOSIS — I2699 Other pulmonary embolism without acute cor pulmonale: Secondary | ICD-10-CM

## 2015-11-08 DIAGNOSIS — C259 Malignant neoplasm of pancreas, unspecified: Secondary | ICD-10-CM | POA: Diagnosis not present

## 2015-11-08 DIAGNOSIS — Z7901 Long term (current) use of anticoagulants: Secondary | ICD-10-CM | POA: Insufficient documentation

## 2015-11-08 DIAGNOSIS — Z09 Encounter for follow-up examination after completed treatment for conditions other than malignant neoplasm: Secondary | ICD-10-CM

## 2015-11-08 DIAGNOSIS — D649 Anemia, unspecified: Secondary | ICD-10-CM | POA: Insufficient documentation

## 2015-11-08 MED ORDER — LIDOCAINE-PRILOCAINE 2.5-2.5 % EX CREA: TOPICAL_CREAM | CUTANEOUS | Status: DC

## 2015-11-08 MED ORDER — PROCHLORPERAZINE MALEATE 10 MG PO TABS: 10.0000 mg | ORAL_TABLET | Freq: Four times a day (QID) | ORAL | Status: DC | PRN

## 2015-11-08 MED ORDER — SERTRALINE HCL 50 MG PO TABS: 100.0000 mg | ORAL_TABLET | Freq: Every day | ORAL | Status: DC

## 2015-11-08 MED ORDER — ONDANSETRON HCL 8 MG PO TABS: 8.0000 mg | ORAL_TABLET | Freq: Three times a day (TID) | ORAL | Status: AC | PRN

## 2015-11-08 MED ORDER — ONDANSETRON HCL 8 MG PO TABS: 8.0000 mg | ORAL_TABLET | Freq: Three times a day (TID) | ORAL | Status: DC | PRN
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
## 2015-11-08 NOTE — Progress Notes (Signed)
Pt here for HFU for pancreatic cancer. Pt reports abdominal pain rated at a 8. Pain is constant and has been present for months. Pt reports the pain tx he has been receiving for the past two weeks has helped some. Pt heart rate elevated at 145.

## 2015-11-08 NOTE — Telephone Encounter (Signed)
Gave new appts to chemo edu nurse...the patient in chemo edu class

## 2015-11-11 ENCOUNTER — Other Ambulatory Visit: Payer: Self-pay

## 2015-11-11 ENCOUNTER — Encounter: Payer: Self-pay | Admitting: Hematology and Oncology

## 2015-11-11 ENCOUNTER — Ambulatory Visit (HOSPITAL_BASED_OUTPATIENT_CLINIC_OR_DEPARTMENT_OTHER): Payer: Self-pay | Admitting: Hematology and Oncology

## 2015-11-11 ENCOUNTER — Ambulatory Visit (HOSPITAL_BASED_OUTPATIENT_CLINIC_OR_DEPARTMENT_OTHER): Payer: Self-pay

## 2015-11-11 ENCOUNTER — Ambulatory Visit: Payer: Self-pay | Admitting: Nurse Practitioner

## 2015-11-11 VITALS — BP 119/80 | HR 125 | Temp 98.3°F | Resp 20

## 2015-11-11 VITALS — BP 137/88 | HR 105 | Temp 98.2°F | Resp 18

## 2015-11-11 DIAGNOSIS — K5903 Drug induced constipation: Secondary | ICD-10-CM

## 2015-11-11 DIAGNOSIS — C787 Secondary malignant neoplasm of liver and intrahepatic bile duct: Secondary | ICD-10-CM

## 2015-11-11 DIAGNOSIS — Z515 Encounter for palliative care: Secondary | ICD-10-CM

## 2015-11-11 DIAGNOSIS — C259 Malignant neoplasm of pancreas, unspecified: Secondary | ICD-10-CM

## 2015-11-11 DIAGNOSIS — G893 Neoplasm related pain (acute) (chronic): Secondary | ICD-10-CM

## 2015-11-11 DIAGNOSIS — Z5111 Encounter for antineoplastic chemotherapy: Secondary | ICD-10-CM

## 2015-11-11 DIAGNOSIS — T402X5A Adverse effect of other opioids, initial encounter: Secondary | ICD-10-CM

## 2015-11-11 DIAGNOSIS — I2699 Other pulmonary embolism without acute cor pulmonale: Secondary | ICD-10-CM

## 2015-11-11 DIAGNOSIS — T7840XA Allergy, unspecified, initial encounter: Secondary | ICD-10-CM

## 2015-11-11 DIAGNOSIS — E440 Moderate protein-calorie malnutrition: Secondary | ICD-10-CM | POA: Insufficient documentation

## 2015-11-11 MED ORDER — FAMOTIDINE IN NACL 20-0.9 MG/50ML-% IV SOLN
20.0000 mg | Freq: Once | INTRAVENOUS | Status: AC
  Administered 2015-11-11: 20 mg via INTRAVENOUS

## 2015-11-11 MED ORDER — ATROPINE SULFATE 1 MG/ML IJ SOLN
0.5000 mg | Freq: Once | INTRAMUSCULAR | Status: AC | PRN
  Administered 2015-11-11: 0.5 mg via INTRAVENOUS

## 2015-11-11 MED ORDER — HEPARIN SOD (PORK) LOCK FLUSH 100 UNIT/ML IV SOLN
500.0000 [IU] | Freq: Once | INTRAVENOUS | Status: DC | PRN
  Filled 2015-11-11: qty 5

## 2015-11-11 MED ORDER — PALONOSETRON HCL INJECTION 0.25 MG/5ML
0.2500 mg | Freq: Once | INTRAVENOUS | Status: AC
Start: 2015-11-11 — End: 2015-11-11
  Administered 2015-11-11: 0.25 mg via INTRAVENOUS

## 2015-11-11 MED ORDER — SODIUM CHLORIDE 0.9 % IV SOLN
2400.0000 mg/m2 | INTRAVENOUS | Status: DC
  Administered 2015-11-11: 5050 mg via INTRAVENOUS
  Filled 2015-11-11: qty 101

## 2015-11-11 MED ORDER — ATROPINE SULFATE 1 MG/ML IJ SOLN
INTRAMUSCULAR | Status: AC
  Filled 2015-11-11: qty 1

## 2015-11-11 MED ORDER — IRINOTECAN HCL CHEMO INJECTION 100 MG/5ML
90.0000 mg/m2 | Freq: Once | INTRAVENOUS | Status: AC
  Administered 2015-11-11: 190 mg via INTRAVENOUS
  Filled 2015-11-11: qty 9.5

## 2015-11-11 MED ORDER — PALONOSETRON HCL INJECTION 0.25 MG/5ML
INTRAVENOUS | Status: AC
  Filled 2015-11-11: qty 5

## 2015-11-11 MED ORDER — DIPHENHYDRAMINE HCL 50 MG/ML IJ SOLN
25.0000 mg | Freq: Once | INTRAMUSCULAR | Status: AC
  Administered 2015-11-11: 25 mg via INTRAVENOUS

## 2015-11-11 MED ORDER — DEXTROSE 5 % IV SOLN
Freq: Once | INTRAVENOUS | Status: AC
  Administered 2015-11-11: 10:00:00 via INTRAVENOUS

## 2015-11-11 MED ORDER — DEXAMETHASONE SODIUM PHOSPHATE 100 MG/10ML IJ SOLN
10.0000 mg | Freq: Once | INTRAMUSCULAR | Status: AC
  Administered 2015-11-11: 10 mg via INTRAVENOUS
  Filled 2015-11-11: qty 1

## 2015-11-11 MED ORDER — LEUCOVORIN CALCIUM INJECTION 350 MG
400.0000 mg/m2 | Freq: Once | INTRAVENOUS | Status: AC
  Administered 2015-11-11: 840 mg via INTRAVENOUS
  Filled 2015-11-11: qty 42

## 2015-11-11 MED ORDER — SODIUM CHLORIDE 0.9% FLUSH
10.0000 mL | INTRAVENOUS | Status: DC | PRN
  Filled 2015-11-11: qty 10

## 2015-11-11 MED ORDER — METHYLPREDNISOLONE SODIUM SUCC 125 MG IJ SOLR
125.0000 mg | Freq: Once | INTRAMUSCULAR | Status: AC
  Administered 2015-11-11: 125 mg via INTRAVENOUS

## 2015-11-11 MED ORDER — OXALIPLATIN CHEMO INJECTION 100 MG/20ML
85.0000 mg/m2 | Freq: Once | INTRAVENOUS | Status: AC
  Administered 2015-11-11: 180 mg via INTRAVENOUS
  Filled 2015-11-11: qty 36

## 2015-11-11 NOTE — Assessment & Plan Note (Signed)
He has experienced significant constipation related to narcotic prescription. I reinforced the importance of taking regular laxatives.

## 2015-11-11 NOTE — Assessment & Plan Note (Signed)
He has lost a lot of weight with poor oral intake due to cancer diagnosis. I reinforced the importance of frequent small meals.

## 2015-11-11 NOTE — Progress Notes (Signed)
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 There are metastases to the liver, spleen and both kidneys   10/30/2015 Tumor Marker CA [id number] was elevated at [id number]   10/30/2015 Imaging US venous Doppler are consistent with acute deep vein thrombosis involving the right peroneal vein, left posterial tibial vein, and left  peroneal vein.  Acute superficial vein thrombosis involving the left small saphenous vein.   10/30/2015 Imaging CT angiogram is positive for pulmonary emboli, involving left lower lobe segmental and subsegmental branches, as well as subsegmental branches of the right lower lobe   10/31/2015 Pathology Results Accession: [id number] liver biopsy was positive for adenocarcinoma consistent with metastatic pancreatic cancer.   10/31/2015 Procedure He had US guided biopsy of liver lesion   11/04/2015 Procedure He had port placement   11/11/2015 -  Chemotherapy He is started on cycle 1 of FOLFIRINOX    INTERVAL HISTORY: Please see below for problem oriented charting. He is seen at the infusion area for cycle 1 of treatment. Since dismissal from the hospital, his pain control is fair. He denies further constipation. He has no nausea but remain with poor oral intake. He complained of leg cramps at the site of blood clots. The patient denies any recent signs or symptoms of bleeding such as spontaneous epistaxis, hematuria or  hematochezia.   REVIEW OF SYSTEMS:   Constitutional: Denies fevers, chills or abnormal weight loss Eyes: Denies blurriness of vision Ears, nose, mouth, throat, and face: Denies mucositis or sore throat Respiratory: Denies cough, dyspnea or wheezes Cardiovascular: Denies palpitation, chest discomfort or lower extremity swelling Skin: Denies abnormal skin rashes Lymphatics: Denies new lymphadenopathy or easy bruising Neurological:Denies numbness, tingling or new weaknesses Behavioral/Psych: Mood is stable, no new changes  All other systems were reviewed with the patient and are negative.  I have reviewed the past medical history, past surgical history, social history and family history with the patient and they are unchanged from previous note.  ALLERGIES:  has No Known Allergies.  MEDICATIONS:  Current Outpatient Prescriptions  Medication Sig Dispense Refill  . ALPRAZolam (XANAX) 0.5 MG tablet Take 1 tablet (0.5 mg total) by mouth 3 (three) times daily as needed for anxiety (try first dose now if patient thinks he needs it. thank you). 75 tablet 0  . lidocaine-prilocaine (EMLA) cream Apply to affected area once 30 g 3  . ondansetron (ZOFRAN) 8 MG tablet Take 1 tablet (8 mg total) by mouth every 8 (eight) hours as needed for nausea, vomiting or refractory nausea / vomiting. 30 tablet 1  . oxyCODONE 10 MG TABS Take 1 tablet (10 mg total) by mouth every 3 (three) hours as needed for moderate pain, severe pain or breakthrough pain. 60 tablet 0  . oxyCODONE 30 MG 12 hr tablet Take 30 mg by mouth every 12 (twelve) hours. 60 tablet 0  . prochlorperazine (COMPAZINE) 10 MG tablet Take 1 tablet (10 mg total)  by mouth every 6 (six) hours as needed (NAUSEA). 30 tablet 1  . Rivaroxaban (XARELTO STARTER PACK) 15 & 20 MG TBPK Take as directed on package: Start with one 93m tablet by mouth twice a day with food. On Day 22, switch to one 242mtablet once a day with food. 51 each 0  . Rivaroxaban  (XARELTO) 15 MG TABS tablet Take 1 tablet (15 mg total) by mouth 2 (two) times daily with a meal. 42 tablet 0  . rivaroxaban (XARELTO) KIT 1 kit by Does not apply route once. 1 kit 0  . senna-docusate (SENOKOT-S) 8.6-50 MG tablet Take 2 tablets by mouth 2 (two) times daily. 120 tablet 0  . sertraline (ZOLOFT) 50 MG tablet Take 2 tablets (100 mg total) by mouth daily. 30 tablet 3  . sorbitol 70 % SOLN Take 40 mLs by mouth 2 (two) times daily. 473 mL 1   No current facility-administered medications for this visit.   Facility-Administered Medications Ordered in Other Visits  Medication Dose Route Frequency Provider Last Rate Last Dose  . atropine injection 0.5 mg  0.5 mg Intravenous Once PRN NiHeath LarkMD      . fluorouracil (ADRUCIL) 5,050 mg in sodium chloride 0.9 % 149 mL chemo infusion  2,400 mg/m2 (Treatment Plan Actual) Intravenous 1 day or 1 dose NiHeath LarkMD      . heparin lock flush 100 unit/mL  500 Units Intracatheter Once PRN NiHeath LarkMD      . irinotecan (CAMPTOSAR) 190 mg in dextrose 5 % 500 mL chemo infusion  90 mg/m2 (Treatment Plan Actual) Intravenous Once NiHeath LarkMD 340 mL/hr at 11/11/15 1237 190 mg at 11/11/15 1237  . leucovorin 840 mg in dextrose 5 % 250 mL infusion  400 mg/m2 (Treatment Plan Actual) Intravenous Once NiHeath LarkMD 146 mL/hr at 11/11/15 1237 840 mg at 11/11/15 1237  . sodium chloride flush (NS) 0.9 % injection 10 mL  10 mL Intracatheter PRN NiHeath LarkMD        PHYSICAL EXAMINATION: ECOG PERFORMANCE STATUS: 2 - Symptomatic, <50% confined to bed  Filed Vitals:   11/11/15 1133  BP: 119/80  Pulse: 125  Temp: 98.3 F (36.8 C)  Resp: 20   There were no vitals filed for this visit.  GENERAL:alert, no distress and comfortable SKIN: skin color, texture, turgor are normal, no rashes or significant lesions EYES: normal, Conjunctiva are pink and non-injected, sclera clear Musculoskeletal:no cyanosis of digits and no clubbing  NEURO: alert & oriented  x 3 with fluent speech, no focal motor/sensory deficits  LABORATORY DATA:  I have reviewed the data as listed    Component Value Date/Time   NA 134* 11/05/2015 0747   K 4.4 11/05/2015 0747   CL 96* 11/05/2015 0747   CO2 25 11/05/2015 0747   GLUCOSE 118* 11/05/2015 0747   BUN 7 11/05/2015 0747   CREATININE 0.80 11/05/2015 0747   CALCIUM 9.8 11/05/2015 0747   PROT 7.6 11/05/2015 0747   ALBUMIN 2.9* 11/05/2015 0747   AST 31 11/05/2015 0747   ALT 34 11/05/2015 0747   ALKPHOS 273* 11/05/2015 0747   BILITOT 1.1 11/05/2015 0747   GFRNONAA >60 11/05/2015 0747   GFRAA >60 11/05/2015 0747    No results found for: SPEP, UPEP  Lab Results  Component Value Date   WBC 14.3* 11/07/2015   HGB 11.4* 11/07/2015   HCT 35.4* 11/07/2015   MCV 84.9 11/07/2015   PLT 242 11/07/2015  Chemistry      Component Value Date/Time   NA 134* 11/05/2015 0747   K 4.4 11/05/2015 0747   CL 96* 11/05/2015 0747   CO2 25 11/05/2015 0747   BUN 7 11/05/2015 0747   CREATININE 0.80 11/05/2015 0747      Component Value Date/Time   CALCIUM 9.8 11/05/2015 0747   ALKPHOS 273* 11/05/2015 0747   AST 31 11/05/2015 0747   ALT 34 11/05/2015 0747   BILITOT 1.1 11/05/2015 0747       ASSESSMENT & PLAN:  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 I modified dosage for cycle one of treatment due to anticipated poor tolerance So far, he has adequate liver reserve I will see him prior to cycle 2 of treatment. Unfortunately, in 2 weeks, the cancer center is close for holidays. I will delay the start date of treatment to 11/27/2015  Cancer associated pain He is currently taking combination therapy with extended release pain medicine and immediate release oxycodone for breakthrough pain medicine. Continue the same. We discussed narcotic refill policy.  Acute pulmonary embolism ([place]) He has extensive DVT and PE and on chronic  anticoagulation therapy. So far he is doing well. I will refill his prescription when I see him back in 2 weeks  Constipation due to opioid therapy He has experienced significant constipation related to narcotic prescription. I reinforced the importance of taking regular laxatives.  Protein-calorie malnutrition, moderate ([place]) He has lost a lot of weight with poor oral intake due to cancer diagnosis. I reinforced the importance of frequent small meals.  Encounter for palliative care The patient is aware he has stage IV disease and treatment is strictly palliative. We discussed importance of Advanced Directives and Living will. We discussed CODE STATUS; the patient desires to remain in full code. He has referral set up for palliative care service to follow as an outpatient   No orders of the defined types were placed in this encounter.   All questions were answered. The patient knows to call the clinic with any problems, questions or concerns. No barriers to learning was detected. I spent 25 minutes counseling the patient face to face. The total time spent in the appointment was 30 minutes and more than 50% was on counseling and review of test results     [facility], [place], MD 11/11/2015 12:57 PM

## 2015-11-11 NOTE — Assessment & Plan Note (Signed)
He has extensive DVT and PE and on chronic anticoagulation therapy. So far he is doing well. I will refill his prescription when I see him back in 2 weeks

## 2015-11-11 NOTE — Assessment & Plan Note (Signed)
He is currently taking combination therapy with extended release pain medicine and immediate release oxycodone for breakthrough pain medicine. Continue the same. We discussed narcotic refill policy.

## 2015-11-11 NOTE — Progress Notes (Signed)
At approx 1325 pt started cold sweating and vommitting.  Infusion stopped.  Selena Lesser came to see pt.  Solumedrol, Pepcid and IV benadryl given.  Irinotecan and Leucovorin resumed 30 minutes later with no further complications.

## 2015-11-11 NOTE — Patient Instructions (Addendum)
[facility] Discharge Instructions for Patients Receiving Chemotherapy  Today you received the following chemotherapy agents:  Oxaliplatin, Leucovorin, Irinotecan, Fluorouracil.  To help prevent nausea and vomiting after your treatment, we encourage you to take your nausea medication as prescribed.   If you develop nausea and vomiting that is not controlled by your nausea medication, call the clinic.   BELOW ARE SYMPTOMS THAT SHOULD BE REPORTED IMMEDIATELY:  *FEVER GREATER THAN 100.5 F  *CHILLS WITH OR WITHOUT FEVER  NAUSEA AND VOMITING THAT IS NOT CONTROLLED WITH YOUR NAUSEA MEDICATION  *UNUSUAL SHORTNESS OF BREATH  *UNUSUAL BRUISING OR BLEEDING  TENDERNESS IN MOUTH AND THROAT WITH OR WITHOUT PRESENCE OF ULCERS  *URINARY PROBLEMS  *BOWEL PROBLEMS  UNUSUAL RASH Items with * indicate a potential emergency and should be followed up as soon as possible.  Feel free to call the clinic you have any questions or concerns. The clinic phone number is [phone].  Please show the [employer] at check-in to the Emergency Department and triage nurse.   Fluorouracil, 5-FU injection What is this medicine? FLUOROURACIL, 5-FU (flure oh YOOR a sil) is a chemotherapy drug. It slows the growth of cancer cells. This medicine is used to treat many types of cancer like breast cancer, colon or rectal cancer, pancreatic cancer, and stomach cancer. This medicine may be used for other purposes; ask your health care provider or pharmacist if you have questions. What should I tell my health care provider before I take this medicine? They need to know if you have any of these conditions: -blood disorders -dihydropyrimidine dehydrogenase (DPD) deficiency -infection (especially a virus infection such as chickenpox, cold sores, or herpes) -kidney disease -liver disease -malnourished, poor nutrition -recent or ongoing radiation therapy -an unusual or allergic reaction to  fluorouracil, other chemotherapy, other medicines, foods, dyes, or preservatives -pregnant or trying to get pregnant -breast-feeding How should I use this medicine? This drug is given as an infusion or injection into a vein. It is administered in a hospital or clinic by a specially trained health care professional. Talk to your pediatrician regarding the use of this medicine in children. Special care may be needed. Overdosage: If you think you have taken too much of this medicine contact a poison control center or emergency room at once. NOTE: This medicine is only for you. Do not share this medicine with others. What if I miss a dose? It is important not to miss your dose. Call your doctor or health care professional if you are unable to keep an appointment. What may interact with this medicine? -allopurinol -cimetidine -dapsone -digoxin -hydroxyurea -leucovorin -levamisole -medicines for seizures like ethotoin, fosphenytoin, phenytoin -medicines to increase blood counts like filgrastim, pegfilgrastim, sargramostim -medicines that treat or prevent blood clots like warfarin, enoxaparin, and dalteparin -methotrexate -metronidazole -pyrimethamine -some other chemotherapy drugs like busulfan, cisplatin, estramustine, vinblastine -trimethoprim -trimetrexate -vaccines Talk to your doctor or health care professional before taking any of these medicines: -acetaminophen -aspirin -ibuprofen -ketoprofen -naproxen This list may not describe all possible interactions. Give your health care provider a list of all the medicines, herbs, non-prescription drugs, or dietary supplements you use. Also tell them if you smoke, drink alcohol, or use illegal drugs. Some items may interact with your medicine. What should I watch for while using this medicine? Visit your doctor for checks on your progress. This drug may make you feel generally unwell. This is not uncommon, as chemotherapy can affect  healthy cells as well as cancer  cells. Report any side effects. Continue your course of treatment even though you feel ill unless your doctor tells you to stop. In some cases, you may be given additional medicines to help with side effects. Follow all directions for their use. Call your doctor or health care professional for advice if you get a fever, chills or sore throat, or other symptoms of a cold or flu. Do not treat yourself. This drug decreases your body's ability to fight infections. Try to avoid being around people who are sick. This medicine may increase your risk to bruise or bleed. Call your doctor or health care professional if you notice any unusual bleeding. Be careful brushing and flossing your teeth or using a toothpick because you may get an infection or bleed more easily. If you have any dental work done, tell your dentist you are receiving this medicine. Avoid taking products that contain aspirin, acetaminophen, ibuprofen, naproxen, or ketoprofen unless instructed by your doctor. These medicines may hide a fever. Do not become pregnant while taking this medicine. Women should inform their doctor if they wish to become pregnant or think they might be pregnant. There is a potential for serious side effects to an unborn child. Talk to your health care professional or pharmacist for more information. Do not breast-feed an infant while taking this medicine. Men should inform their doctor if they wish to father a child. This medicine may lower sperm counts. Do not treat diarrhea with over the counter products. Contact your doctor if you have diarrhea that lasts more than 2 days or if it is severe and watery. This medicine can make you more sensitive to the sun. Keep out of the sun. If you cannot avoid being in the sun, wear protective clothing and use sunscreen. Do not use sun lamps or tanning beds/booths. What side effects may I notice from receiving this medicine? Side effects that you  should report to your doctor or health care professional as soon as possible: -allergic reactions like skin rash, itching or hives, swelling of the face, lips, or tongue -low blood counts - this medicine may decrease the number of white blood cells, red blood cells and platelets. You may be at increased risk for infections and bleeding. -signs of infection - fever or chills, cough, sore throat, pain or difficulty passing urine -signs of decreased platelets or bleeding - bruising, pinpoint red spots on the skin, black, tarry stools, blood in the urine -signs of decreased red blood cells - unusually weak or tired, fainting spells, lightheadedness -breathing problems -changes in vision -chest pain -mouth sores -nausea and vomiting -pain, swelling, redness at site where injected -pain, tingling, numbness in the hands or feet -redness, swelling, or sores on hands or feet -stomach pain -unusual bleeding Side effects that usually do not require medical attention (report to your doctor or health care professional if they continue or are bothersome): -changes in finger or toe nails -diarrhea -dry or itchy skin -hair loss -headache -loss of appetite -sensitivity of eyes to the light -stomach upset -unusually teary eyes This list may not describe all possible side effects. Call your doctor for medical advice about side effects. You may report side effects to FDA at 1-800-FDA-1088. Where should I keep my medicine? This drug is given in a hospital or clinic and will not be stored at home. NOTE: This sheet is a summary. It may not cover all possible information. If you have questions about this medicine, talk to your doctor, pharmacist, or health  care provider.    2016, Elsevier/[employer] Standard. (2007-10-19 13:53:16)   Irinotecan injection What is this medicine? IRINOTECAN (ir in oh TEE kan ) is a chemotherapy drug. It is used to treat colon and rectal cancer. This medicine may be used for  other purposes; ask your health care provider or pharmacist if you have questions. What should I tell my health care provider before I take this medicine? They need to know if you have any of these conditions: -blood disorders -dehydration -diarrhea -infection (especially a virus infection such as chickenpox, cold sores, or herpes) -liver disease -low blood counts, like low white cell, platelet, or red cell counts -recent or ongoing radiation therapy -an unusual or allergic reaction to irinotecan, sorbitol, other chemotherapy, other medicines, foods, dyes, or preservatives -pregnant or trying to get pregnant -breast-feeding How should I use this medicine? This drug is given as an infusion into a vein. It is administered in a hospital or clinic by a specially trained health care professional. Talk to your pediatrician regarding the use of this medicine in children. Special care may be needed. Overdosage: If you think you have taken too much of this medicine contact a poison control center or emergency room at once. NOTE: This medicine is only for you. Do not share this medicine with others. What if I miss a dose? It is important not to miss your dose. Call your doctor or health care professional if you are unable to keep an appointment. What may interact with this medicine? Do not take this medicine with any of the following medications: -atazanavir -certain medicines for fungal infections like itraconazole and ketoconazole -St. John's Wort This medicine may also interact with the following medications: -dexamethasone -diuretics -laxatives -medicines for seizures like carbamazepine, mephobarbital, phenobarbital, phenytoin, primidone -medicines to increase blood counts like filgrastim, pegfilgrastim, sargramostim -prochlorperazine -vaccines This list may not describe all possible interactions. Give your health care provider a list of all the medicines, herbs, non-prescription drugs, or  dietary supplements you use. Also tell them if you smoke, drink alcohol, or use illegal drugs. Some items may interact with your medicine. What should I watch for while using this medicine? Your condition will be monitored carefully while you are receiving this medicine. You will need important blood work done while you are taking this medicine. This drug may make you feel generally unwell. This is not uncommon, as chemotherapy can affect healthy cells as well as cancer cells. Report any side effects. Continue your course of treatment even though you feel ill unless your doctor tells you to stop. In some cases, you may be given additional medicines to help with side effects. Follow all directions for their use. You may get drowsy or dizzy. Do not drive, use machinery, or do anything that needs mental alertness until you know how this medicine affects you. Do not stand or sit up quickly, especially if you are an older patient. This reduces the risk of dizzy or fainting spells. Call your doctor or health care professional for advice if you get a fever, chills or sore throat, or other symptoms of a cold or flu. Do not treat yourself. This drug decreases your body's ability to fight infections. Try to avoid being around people who are sick. This medicine may increase your risk to bruise or bleed. Call your doctor or health care professional if you notice any unusual bleeding. Be careful brushing and flossing your teeth or using a toothpick because you may get an infection or bleed more  easily. If you have any dental work done, tell your dentist you are receiving this medicine. Avoid taking products that contain aspirin, acetaminophen, ibuprofen, naproxen, or ketoprofen unless instructed by your doctor. These medicines may hide a fever. Do not become pregnant while taking this medicine. Women should inform their doctor if they wish to become pregnant or think they might be pregnant. There is a potential for  serious side effects to an unborn child. Talk to your health care professional or pharmacist for more information. Do not breast-feed an infant while taking this medicine. What side effects may I notice from receiving this medicine? Side effects that you should report to your doctor or health care professional as soon as possible: -allergic reactions like skin rash, itching or hives, swelling of the face, lips, or tongue -low blood counts - this medicine may decrease the number of white blood cells, red blood cells and platelets. You may be at increased risk for infections and bleeding. -signs of infection - fever or chills, cough, sore throat, pain or difficulty passing urine -signs of decreased platelets or bleeding - bruising, pinpoint red spots on the skin, black, tarry stools, blood in the urine -signs of decreased red blood cells - unusually weak or tired, fainting spells, lightheadedness -breathing problems -chest pain -diarrhea -feeling faint or lightheaded, falls -flushing, runny nose, sweating during infusion -mouth sores or pain -pain, swelling, redness or irritation where injected -pain, swelling, warmth in the leg -pain, tingling, numbness in the hands or feet -problems with balance, talking, walking -stomach cramps, pain -trouble passing urine or change in the amount of urine -vomiting as to be unable to hold down drinks or food -yellowing of the eyes or skin Side effects that usually do not require medical attention (report to your doctor or health care professional if they continue or are bothersome): -constipation -hair loss -headache -loss of appetite -nausea, vomiting -stomach upset This list may not describe all possible side effects. Call your doctor for medical advice about side effects. You may report side effects to FDA at 1-800-FDA-1088. Where should I keep my medicine? This drug is given in a hospital or clinic and will not be stored at home. NOTE: This sheet  is a summary. It may not cover all possible information. If you have questions about this medicine, talk to your doctor, pharmacist, or health care provider.    2016, Elsevier/Gold Standard. (2012-12-12 16:29:32)   Leucovorin injection What is this medicine? LEUCOVORIN (loo koe VOR in) is used to prevent or treat the harmful effects of some medicines. This medicine is used to treat anemia caused by a low amount of folic acid in the body. It is also used with 5-fluorouracil (5-FU) to treat colon cancer. This medicine may be used for other purposes; ask your health care provider or pharmacist if you have questions. What should I tell my health care provider before I take this medicine? They need to know if you have any of these conditions: -anemia from low levels of vitamin B-12 in the blood -an unusual or allergic reaction to leucovorin, folic acid, other medicines, foods, dyes, or preservatives -pregnant or trying to get pregnant -breast-feeding How should I use this medicine? This medicine is for injection into a muscle or into a vein. It is given by a health care professional in a hospital or clinic setting. Talk to your pediatrician regarding the use of this medicine in children. Special care may be needed. Overdosage: If you think you have taken too  much of this medicine contact a poison control center or emergency room at once. NOTE: This medicine is only for you. Do not share this medicine with others. What if I miss a dose? This does not apply. What may interact with this medicine? -capecitabine -fluorouracil -phenobarbital -phenytoin -primidone -trimethoprim-sulfamethoxazole This list may not describe all possible interactions. Give your health care provider a list of all the medicines, herbs, non-prescription drugs, or dietary supplements you use. Also tell them if you smoke, drink alcohol, or use illegal drugs. Some items may interact with your medicine. What should I watch  for while using this medicine? Your condition will be monitored carefully while you are receiving this medicine. This medicine may increase the side effects of 5-fluorouracil, 5-FU. Tell your doctor or health care professional if you have diarrhea or mouth sores that do not get better or that get worse. What side effects may I notice from receiving this medicine? Side effects that you should report to your doctor or health care professional as soon as possible: -allergic reactions like skin rash, itching or hives, swelling of the face, lips, or tongue -breathing problems -fever, infection -mouth sores -unusual bleeding or bruising -unusually weak or tired Side effects that usually do not require medical attention (report to your doctor or health care professional if they continue or are bothersome): -constipation or diarrhea -loss of appetite -nausea, vomiting This list may not describe all possible side effects. Call your doctor for medical advice about side effects. You may report side effects to FDA at 1-800-FDA-1088. Where should I keep my medicine? This drug is given in a hospital or clinic and will not be stored at home. NOTE: This sheet is a summary. It may not cover all possible information. If you have questions about this medicine, talk to your doctor, pharmacist, or health care provider.    2016, Elsevier/Gold Standard. (2007-12-20 16:50:29)   Oxaliplatin Injection What is this medicine? OXALIPLATIN (ox AL i PLA tin) is a chemotherapy drug. It targets fast dividing cells, like cancer cells, and causes these cells to die. This medicine is used to treat cancers of the colon and rectum, and many other cancers. This medicine may be used for other purposes; ask your health care provider or pharmacist if you have questions. What should I tell my health care provider before I take this medicine? They need to know if you have any of these conditions: -kidney disease -an unusual or  allergic reaction to oxaliplatin, other chemotherapy, other medicines, foods, dyes, or preservatives -pregnant or trying to get pregnant -breast-feeding How should I use this medicine? This drug is given as an infusion into a vein. It is administered in a hospital or clinic by a specially trained health care professional. Talk to your pediatrician regarding the use of this medicine in children. Special care may be needed. Overdosage: If you think you have taken too much of this medicine contact a poison control center or emergency room at once. NOTE: This medicine is only for you. Do not share this medicine with others. What if I miss a dose? It is important not to miss a dose. Call your doctor or health care professional if you are unable to keep an appointment. What may interact with this medicine? -medicines to increase blood counts like filgrastim, pegfilgrastim, sargramostim -probenecid -some antibiotics like amikacin, gentamicin, neomycin, polymyxin B, streptomycin, tobramycin -zalcitabine Talk to your doctor or health care professional before taking any of these medicines: -acetaminophen -aspirin -ibuprofen -ketoprofen -naproxen This  list may not describe all possible interactions. Give your health care provider a list of all the medicines, herbs, non-prescription drugs, or dietary supplements you use. Also tell them if you smoke, drink alcohol, or use illegal drugs. Some items may interact with your medicine. What should I watch for while using this medicine? Your condition will be monitored carefully while you are receiving this medicine. You will need important blood work done while you are taking this medicine. This medicine can make you more sensitive to cold. Do not drink cold drinks or use ice. Cover exposed skin before coming in contact with cold temperatures or cold objects. When out in cold weather wear warm clothing and cover your mouth and nose to warm the air that goes  into your lungs. Tell your doctor if you get sensitive to the cold. This drug may make you feel generally unwell. This is not uncommon, as chemotherapy can affect healthy cells as well as cancer cells. Report any side effects. Continue your course of treatment even though you feel ill unless your doctor tells you to stop. In some cases, you may be given additional medicines to help with side effects. Follow all directions for their use. Call your doctor or health care professional for advice if you get a fever, chills or sore throat, or other symptoms of a cold or flu. Do not treat yourself. This drug decreases your body's ability to fight infections. Try to avoid being around people who are sick. This medicine may increase your risk to bruise or bleed. Call your doctor or health care professional if you notice any unusual bleeding. Be careful brushing and flossing your teeth or using a toothpick because you may get an infection or bleed more easily. If you have any dental work done, tell your dentist you are receiving this medicine. Avoid taking products that contain aspirin, acetaminophen, ibuprofen, naproxen, or ketoprofen unless instructed by your doctor. These medicines may hide a fever. Do not become pregnant while taking this medicine. Women should inform their doctor if they wish to become pregnant or think they might be pregnant. There is a potential for serious side effects to an unborn child. Talk to your health care professional or pharmacist for more information. Do not breast-feed an infant while taking this medicine. Call your doctor or health care professional if you get diarrhea. Do not treat yourself. What side effects may I notice from receiving this medicine? Side effects that you should report to your doctor or health care professional as soon as possible: -allergic reactions like skin rash, itching or hives, swelling of the face, lips, or tongue -low blood counts - This drug may  decrease the number of white blood cells, red blood cells and platelets. You may be at increased risk for infections and bleeding. -signs of infection - fever or chills, cough, sore throat, pain or difficulty passing urine -signs of decreased platelets or bleeding - bruising, pinpoint red spots on the skin, black, tarry stools, nosebleeds -signs of decreased red blood cells - unusually weak or tired, fainting spells, lightheadedness -breathing problems -chest pain, pressure -cough -diarrhea -jaw tightness -mouth sores -nausea and vomiting -pain, swelling, redness or irritation at the injection site -pain, tingling, numbness in the hands or feet -problems with balance, talking, walking -redness, blistering, peeling or loosening of the skin, including inside the mouth -trouble passing urine or change in the amount of urine Side effects that usually do not require medical attention (report to your doctor or health  care professional if they continue or are bothersome): -changes in vision -constipation -hair loss -loss of appetite -metallic taste in the mouth or changes in taste -stomach pain This list may not describe all possible side effects. Call your doctor for medical advice about side effects. You may report side effects to FDA at 1-800-FDA-1088. Where should I keep my medicine? This drug is given in a hospital or clinic and will not be stored at home. NOTE: This sheet is a summary. It may not cover all possible information. If you have questions about this medicine, talk to your doctor, pharmacist, or health care provider.    2016, Elsevier/[employer] Standard. (2008-01-10 17:22:47)

## 2015-11-11 NOTE — Assessment & Plan Note (Addendum)
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 We discussed importance of Advanced Directives and Living will. We discussed CODE STATUS; the patient desires to remain in full code. He has referral set up for palliative care service to follow as an outpatient

## 2015-11-11 NOTE — Assessment & Plan Note (Addendum)
So far, he tolerated treatment well today without signs of allergic reaction. I modified dosage for cycle one of treatment due to anticipated poor tolerance So far, he has adequate liver reserve I will see him prior to cycle 2 of treatment. Unfortunately, in 2 weeks, the cancer center is close for holidays. I will delay the start date of treatment to 11/27/2015

## 2015-11-12 ENCOUNTER — Encounter (HOSPITAL_COMMUNITY): Payer: Self-pay | Admitting: Family Medicine

## 2015-11-12 ENCOUNTER — Inpatient Hospital Stay (HOSPITAL_COMMUNITY)
Admission: EM | Admit: 2015-11-12 | Discharge: 2015-11-17 | DRG: 166 | Disposition: A | Discharge status: Home / Self Care | Payer: Medicaid Other | Attending: Internal Medicine | Admitting: Internal Medicine

## 2015-11-12 ENCOUNTER — Telehealth: Payer: Self-pay | Admitting: *Deleted

## 2015-11-12 ENCOUNTER — Emergency Department (HOSPITAL_COMMUNITY): Payer: Medicaid Other

## 2015-11-12 ENCOUNTER — Other Ambulatory Visit: Payer: Self-pay

## 2015-11-12 ENCOUNTER — Encounter: Payer: Self-pay | Admitting: Nurse Practitioner

## 2015-11-12 DIAGNOSIS — R112 Nausea with vomiting, unspecified: Secondary | ICD-10-CM | POA: Diagnosis present

## 2015-11-12 DIAGNOSIS — K5903 Drug induced constipation: Secondary | ICD-10-CM | POA: Diagnosis not present

## 2015-11-12 DIAGNOSIS — T7840XA Allergy, unspecified, initial encounter: Secondary | ICD-10-CM | POA: Insufficient documentation

## 2015-11-12 DIAGNOSIS — Z86711 Personal history of pulmonary embolism: Secondary | ICD-10-CM

## 2015-11-12 DIAGNOSIS — F418 Other specified anxiety disorders: Secondary | ICD-10-CM | POA: Diagnosis present

## 2015-11-12 DIAGNOSIS — R079 Chest pain, unspecified: Secondary | ICD-10-CM | POA: Diagnosis present

## 2015-11-12 DIAGNOSIS — I2609 Other pulmonary embolism with acute cor pulmonale: Secondary | ICD-10-CM | POA: Diagnosis present

## 2015-11-12 DIAGNOSIS — Z6826 Body mass index (BMI) 26.0-26.9, adult: Secondary | ICD-10-CM

## 2015-11-12 DIAGNOSIS — I2699 Other pulmonary embolism without acute cor pulmonale: Secondary | ICD-10-CM | POA: Diagnosis present

## 2015-11-12 DIAGNOSIS — Z85038 Personal history of other malignant neoplasm of large intestine: Secondary | ICD-10-CM | POA: Diagnosis not present

## 2015-11-12 DIAGNOSIS — Z79899 Other long term (current) drug therapy: Secondary | ICD-10-CM | POA: Diagnosis not present

## 2015-11-12 DIAGNOSIS — C259 Malignant neoplasm of pancreas, unspecified: Secondary | ICD-10-CM | POA: Diagnosis present

## 2015-11-12 DIAGNOSIS — Z515 Encounter for palliative care: Secondary | ICD-10-CM | POA: Diagnosis present

## 2015-11-12 DIAGNOSIS — I82409 Acute embolism and thrombosis of unspecified deep veins of unspecified lower extremity: Secondary | ICD-10-CM | POA: Diagnosis present

## 2015-11-12 DIAGNOSIS — R0600 Dyspnea, unspecified: Secondary | ICD-10-CM | POA: Insufficient documentation

## 2015-11-12 DIAGNOSIS — Z7901 Long term (current) use of anticoagulants: Secondary | ICD-10-CM | POA: Diagnosis not present

## 2015-11-12 DIAGNOSIS — C799 Secondary malignant neoplasm of unspecified site: Secondary | ICD-10-CM

## 2015-11-12 DIAGNOSIS — G893 Neoplasm related pain (acute) (chronic): Secondary | ICD-10-CM | POA: Diagnosis present

## 2015-11-12 DIAGNOSIS — I82442 Acute embolism and thrombosis of left tibial vein: Secondary | ICD-10-CM | POA: Diagnosis present

## 2015-11-12 DIAGNOSIS — E43 Unspecified severe protein-calorie malnutrition: Secondary | ICD-10-CM | POA: Diagnosis present

## 2015-11-12 DIAGNOSIS — I2602 Saddle embolus of pulmonary artery with acute cor pulmonale: Secondary | ICD-10-CM

## 2015-11-12 DIAGNOSIS — Z87891 Personal history of nicotine dependence: Secondary | ICD-10-CM

## 2015-11-12 DIAGNOSIS — E440 Moderate protein-calorie malnutrition: Secondary | ICD-10-CM | POA: Diagnosis present

## 2015-11-12 DIAGNOSIS — C787 Secondary malignant neoplasm of liver and intrahepatic bile duct: Secondary | ICD-10-CM | POA: Diagnosis present

## 2015-11-12 DIAGNOSIS — R000 Tachycardia, unspecified: Secondary | ICD-10-CM | POA: Diagnosis present

## 2015-11-12 DIAGNOSIS — E860 Dehydration: Secondary | ICD-10-CM | POA: Diagnosis present

## 2015-11-12 DIAGNOSIS — T402X5A Adverse effect of other opioids, initial encounter: Secondary | ICD-10-CM | POA: Diagnosis not present

## 2015-11-12 DIAGNOSIS — I824Y1 Acute embolism and thrombosis of unspecified deep veins of right proximal lower extremity: Secondary | ICD-10-CM | POA: Diagnosis present

## 2015-11-12 DIAGNOSIS — R197 Diarrhea, unspecified: Secondary | ICD-10-CM

## 2015-11-12 DIAGNOSIS — I5189 Other ill-defined heart diseases: Secondary | ICD-10-CM

## 2015-11-12 DIAGNOSIS — Z86718 Personal history of other venous thrombosis and embolism: Secondary | ICD-10-CM

## 2015-11-12 DIAGNOSIS — D72829 Elevated white blood cell count, unspecified: Secondary | ICD-10-CM | POA: Diagnosis present

## 2015-11-12 HISTORY — DX: Other specified anxiety disorders: F41.8

## 2015-11-12 HISTORY — DX: Malignant neoplasm of pancreas, unspecified: C25.9

## 2015-11-12 LAB — BASIC METABOLIC PANEL
ANION GAP: 10 (ref 5–15)
BUN: 18 mg/dL (ref 6–20)
CALCIUM: 9.8 mg/dL (ref 8.9–10.3)
CO2: 23 mmol/L (ref 22–32)
Chloride: 98 mmol/L — ABNORMAL LOW (ref 101–111)
Creatinine, Ser: 0.82 mg/dL (ref 0.61–1.24)
GFR calc Af Amer: >60 mL/min (ref >60)
Glucose, Bld: 129 mg/dL — ABNORMAL HIGH (ref 65–99)
POTASSIUM: 4.2 mmol/L (ref 3.5–5.1)
SODIUM: 131 mmol/L — AB (ref 135–145)

## 2015-11-12 LAB — HEPATIC FUNCTION PANEL
ALT: 44 U/L (ref 17–63)
AST: 42 U/L — ABNORMAL HIGH (ref 15–41)
Albumin: 2.9 g/dL — ABNORMAL LOW (ref 3.5–5.0)
Alkaline Phosphatase: 399 U/L — ABNORMAL HIGH (ref 38–126)
BILIRUBIN DIRECT: 0.5 mg/dL (ref 0.1–0.5)
Indirect Bilirubin: 1 mg/dL — ABNORMAL HIGH (ref 0.3–0.9)
TOTAL PROTEIN: 7.7 g/dL (ref 6.5–8.1)
Total Bilirubin: 1.5 mg/dL — ABNORMAL HIGH (ref 0.3–1.2)

## 2015-11-12 LAB — CBC
HEMATOCRIT: 33.3 % — AB (ref 39.0–52.0)
Hemoglobin: 11.1 g/dL — ABNORMAL LOW (ref 13.0–17.0)
MCH: 26.8 pg (ref 26.0–34.0)
MCHC: 33.3 g/dL (ref 30.0–36.0)
MCV: 80.4 fL (ref 78.0–100.0)
Platelets: 210 10*3/uL (ref 150–400)
RBC: 4.14 MIL/uL — ABNORMAL LOW (ref 4.22–5.81)
RDW: 14.4 % (ref 11.5–15.5)
WBC: 17.5 10*3/uL — AB (ref 4.0–10.5)

## 2015-11-12 LAB — I-STAT TROPONIN, ED: TROPONIN I, POC: 0.01 ng/mL (ref 0.00–0.08)

## 2015-11-12 LAB — I-STAT CG4 LACTIC ACID, ED: Lactic Acid, Venous: 1.34 mmol/L (ref 0.5–2.0)

## 2015-11-12 LAB — LIPASE, BLOOD: Lipase: 79 U/L — ABNORMAL HIGH (ref 11–51)

## 2015-11-12 MED ORDER — IOPAMIDOL (ISOVUE-370) INJECTION 76%
100.0000 mL | Freq: Once | INTRAVENOUS | Status: AC | PRN
Start: 2015-11-12 — End: 2015-11-12
  Administered 2015-11-12: 100 mL via INTRAVENOUS

## 2015-11-12 MED ORDER — SODIUM CHLORIDE 0.9 % IV BOLUS (SEPSIS)
1000.0000 mL | Freq: Once | INTRAVENOUS | Status: AC
  Administered 2015-11-12: 1000 mL via INTRAVENOUS

## 2015-11-12 MED ORDER — DIPHENHYDRAMINE HCL 50 MG/ML IJ SOLN
25.0000 mg | Freq: Once | INTRAMUSCULAR | Status: AC
  Administered 2015-11-12: 25 mg via INTRAVENOUS
  Filled 2015-11-12: qty 1

## 2015-11-12 MED ORDER — HYDROMORPHONE HCL 1 MG/ML IJ SOLN
1.0000 mg | Freq: Once | INTRAMUSCULAR | Status: AC
  Administered 2015-11-12: 1 mg via INTRAVENOUS
  Filled 2015-11-12: qty 1

## 2015-11-12 MED ORDER — SODIUM CHLORIDE 0.9 % IV SOLN
25.0000 mg | Freq: Once | INTRAVENOUS | Status: AC
  Administered 2015-11-12: 25 mg via INTRAVENOUS
  Filled 2015-11-12: qty 1
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
## 2015-11-12 NOTE — Assessment & Plan Note (Signed)
Patient presented to the [facility] today to receive his first folfirinox chemotherapy regimen.  He experienced a mild hypersensitivity reaction; which was managed per protocol.  Patient is scheduled to return on 11/13/2015 for pump discontinuation and his Neulasta injection.  He is scheduled to return on 11/27/2015 for labs, visit, flush, and chemotherapy.

## 2015-11-12 NOTE — ED Notes (Signed)
Pt in restroom actively vomiting and having diarrhea

## 2015-11-12 NOTE — Assessment & Plan Note (Signed)
Patient presented to the [place] [facility] today to receive his first folfirinox chemotherapy regimen.  He experienced a mild hypersensitivity reaction to the irinotecan portion of his chemotherapy; which consisted of one episode of nausea/vomiting, and some cold sweats.  Infusion was held; and patient received Benadryl, Pepcid, and Solu-Medrol per protocol.  All symptoms resolved; and patient was able to complete all of his chemotherapy with no further issues.  Vital signs remained stable throughout.  Patient is scheduled to return on 11/13/2015 for pump discontinuation and his Neulasta injection.  He is scheduled to return on 11/27/2015 for labs, visit, flush, and chemotherapy.
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
## 2015-11-12 NOTE — ED Notes (Signed)
MD at bedside. 

## 2015-11-12 NOTE — Telephone Encounter (Signed)
Called and spoke to patient regarding first time chemotherapy. Patient stated,"I did OK over night. I had a touch of nausea but I've got medications." Denies pain, SOB and vomiting. Patient to come tomorrow and have pump d/c. Instructed patient to call [facility] if he had any questions or concerns. Patient verbalized understanding.

## 2015-11-12 NOTE — ED Notes (Signed)
Pt has pancreatic cancer and received his first dose of chemotherapy at [facility]. Pt reports he got sick during chemo and vomited. In the afternoon, he developed mid sternal chest pain that radiates to his back. SHOB started yesterday afternoon/night. Took PROCHLORPER 10mg  at 10:00am and 17:30 this afternoon.
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
## 2015-11-12 NOTE — ED Notes (Signed)
Pt transported to XRay 

## 2015-11-12 NOTE — ED Notes (Signed)
Pt in CT.

## 2015-11-12 NOTE — ED Notes (Signed)
Bed: [id number] Expected date:  Expected time:  Means of arrival:  Comments: Triage 5

## 2015-11-12 NOTE — ED Notes (Signed)
Two Unsuccessful IV Attempts by this nurse

## 2015-11-12 NOTE — ED Notes (Signed)
MD Alvino Chapel notified that pt requesting pain meds

## 2015-11-12 NOTE — Telephone Encounter (Signed)
-----   Message from Paulla Dolly, RN sent at 11/11/2015  3:35 PM EDT ----- Regarding: first chemo Dr Michelle Piper: [phone] First FOLFIRINOX 11/11/15.  Dr Alvy Bimler

## 2015-11-13 ENCOUNTER — Ambulatory Visit: Payer: Self-pay

## 2015-11-13 ENCOUNTER — Inpatient Hospital Stay (HOSPITAL_COMMUNITY): Payer: Medicaid Other

## 2015-11-13 ENCOUNTER — Encounter (HOSPITAL_COMMUNITY): Payer: Self-pay | Admitting: Internal Medicine

## 2015-11-13 ENCOUNTER — Encounter: Payer: Self-pay | Admitting: *Deleted

## 2015-11-13 ENCOUNTER — Ambulatory Visit: Payer: Self-pay | Admitting: Hematology and Oncology

## 2015-11-13 DIAGNOSIS — I82409 Acute embolism and thrombosis of unspecified deep veins of unspecified lower extremity: Secondary | ICD-10-CM | POA: Diagnosis present

## 2015-11-13 DIAGNOSIS — I2699 Other pulmonary embolism without acute cor pulmonale: Secondary | ICD-10-CM

## 2015-11-13 DIAGNOSIS — R197 Diarrhea, unspecified: Secondary | ICD-10-CM

## 2015-11-13 DIAGNOSIS — R079 Chest pain, unspecified: Secondary | ICD-10-CM

## 2015-11-13 DIAGNOSIS — E43 Unspecified severe protein-calorie malnutrition: Secondary | ICD-10-CM | POA: Diagnosis present

## 2015-11-13 DIAGNOSIS — I2609 Other pulmonary embolism with acute cor pulmonale: Secondary | ICD-10-CM | POA: Diagnosis present

## 2015-11-13 DIAGNOSIS — R0602 Shortness of breath: Secondary | ICD-10-CM

## 2015-11-13 DIAGNOSIS — F418 Other specified anxiety disorders: Secondary | ICD-10-CM | POA: Diagnosis present

## 2015-11-13 DIAGNOSIS — D72829 Elevated white blood cell count, unspecified: Secondary | ICD-10-CM | POA: Diagnosis present

## 2015-11-13 DIAGNOSIS — C259 Malignant neoplasm of pancreas, unspecified: Secondary | ICD-10-CM

## 2015-11-13 DIAGNOSIS — E440 Moderate protein-calorie malnutrition: Secondary | ICD-10-CM

## 2015-11-13 DIAGNOSIS — C787 Secondary malignant neoplasm of liver and intrahepatic bile duct: Secondary | ICD-10-CM

## 2015-11-13 DIAGNOSIS — R112 Nausea with vomiting, unspecified: Secondary | ICD-10-CM | POA: Diagnosis present

## 2015-11-13 DIAGNOSIS — E860 Dehydration: Secondary | ICD-10-CM

## 2015-11-13 DIAGNOSIS — I824Y3 Acute embolism and thrombosis of unspecified deep veins of proximal lower extremity, bilateral: Secondary | ICD-10-CM

## 2015-11-13 DIAGNOSIS — R000 Tachycardia, unspecified: Secondary | ICD-10-CM

## 2015-11-13 LAB — BASIC METABOLIC PANEL
ANION GAP: 9 (ref 5–15)
BUN: 15 mg/dL (ref 6–20)
CO2: 24 mmol/L (ref 22–32)
Calcium: 9.4 mg/dL (ref 8.9–10.3)
Chloride: 101 mmol/L (ref 101–111)
Creatinine, Ser: 0.74 mg/dL (ref 0.61–1.24)
Glucose, Bld: 110 mg/dL — ABNORMAL HIGH (ref 65–99)
POTASSIUM: 4.6 mmol/L (ref 3.5–5.1)
Sodium: 134 mmol/L — ABNORMAL LOW (ref 135–145)

## 2015-11-13 LAB — ECHOCARDIOGRAM COMPLETE
Height: 69 in
WEIGHTICAEL: 2903.02 [oz_av]

## 2015-11-13 LAB — PROTIME-INR
INR: 2.59 — AB (ref 0.00–1.49)
INR: 1.59 — AB (ref 0.00–1.49)
PROTHROMBIN TIME: 27.4 s — AB (ref 11.6–15.2)
Prothrombin Time: 19 seconds — ABNORMAL HIGH (ref 11.6–15.2)

## 2015-11-13 LAB — CBC
HCT: 31.2 % — ABNORMAL LOW (ref 39.0–52.0)
Hemoglobin: 10.2 g/dL — ABNORMAL LOW (ref 13.0–17.0)
MCH: 26.7 pg (ref 26.0–34.0)
MCHC: 32.7 g/dL (ref 30.0–36.0)
MCV: 81.7 fL (ref 78.0–100.0)
PLATELETS: 189 10*3/uL (ref 150–400)
RBC: 3.82 MIL/uL — ABNORMAL LOW (ref 4.22–5.81)
RDW: 14.6 % (ref 11.5–15.5)
WBC: 14.8 10*3/uL — AB (ref 4.0–10.5)

## 2015-11-13 LAB — COMPREHENSIVE METABOLIC PANEL
ALK PHOS: 367 U/L — AB (ref 38–126)
ALT: 39 U/L (ref 17–63)
AST: 35 U/L (ref 15–41)
Albumin: 2.8 g/dL — ABNORMAL LOW (ref 3.5–5.0)
Anion gap: 8 (ref 5–15)
BILIRUBIN TOTAL: 1.7 mg/dL — AB (ref 0.3–1.2)
BUN: 17 mg/dL (ref 6–20)
CALCIUM: 9.2 mg/dL (ref 8.9–10.3)
CHLORIDE: 100 mmol/L — AB (ref 101–111)
CO2: 24 mmol/L (ref 22–32)
CREATININE: 0.73 mg/dL (ref 0.61–1.24)
Glucose, Bld: 121 mg/dL — ABNORMAL HIGH (ref 65–99)
Potassium: 4.4 mmol/L (ref 3.5–5.1)
Sodium: 132 mmol/L — ABNORMAL LOW (ref 135–145)
TOTAL PROTEIN: 7.2 g/dL (ref 6.5–8.1)

## 2015-11-13 LAB — HEPARIN LEVEL (UNFRACTIONATED)
HEPARIN UNFRACTIONATED: 1.2 [IU]/mL — AB (ref 0.30–0.70)
HEPARIN UNFRACTIONATED: 0.86 [IU]/mL — AB (ref 0.30–0.70)

## 2015-11-13 LAB — TROPONIN I
TROPONIN I: 0.03 ng/mL (ref <0.031)
TROPONIN I: 0.03 ng/mL (ref <0.031)
Troponin I: 0.03 ng/mL (ref <0.031)

## 2015-11-13 LAB — APTT
aPTT: 157 seconds — ABNORMAL HIGH (ref 24–37)
aPTT: 86 seconds — ABNORMAL HIGH (ref 24–37)

## 2015-11-13 LAB — GLUCOSE, CAPILLARY: GLUCOSE-CAPILLARY: 97 mg/dL (ref 65–99)

## 2015-11-13 LAB — MRSA PCR SCREENING: MRSA BY PCR: NEGATIVE

## 2015-11-13 MED ORDER — ACETAMINOPHEN 650 MG RE SUPP
650.0000 mg | Freq: Four times a day (QID) | RECTAL | Status: DC | PRN
Start: 2015-11-13 — End: 2015-11-13

## 2015-11-13 MED ORDER — LORAZEPAM 2 MG/ML IJ SOLN
0.5000 mg | Freq: Four times a day (QID) | INTRAMUSCULAR | Status: DC | PRN
  Administered 2015-11-13: 0.5 mg via INTRAVENOUS
  Filled 2015-11-13 (×2): qty 1

## 2015-11-13 MED ORDER — LIDOCAINE HCL 1 % IJ SOLN
INTRAMUSCULAR | Status: AC | PRN
  Administered 2015-11-13: 5 mL

## 2015-11-13 MED ORDER — ACETAMINOPHEN 325 MG PO TABS: 650.0000 mg | ORAL_TABLET | ORAL | Status: DC | PRN

## 2015-11-13 MED ORDER — FENTANYL CITRATE (PF) 100 MCG/2ML IJ SOLN
INTRAMUSCULAR | Status: AC
  Filled 2015-11-13: qty 2

## 2015-11-13 MED ORDER — ONDANSETRON HCL 4 MG/2ML IJ SOLN: 4.0000 mg | Freq: Three times a day (TID) | INTRAMUSCULAR | Status: DC | PRN

## 2015-11-13 MED ORDER — MIDAZOLAM HCL 2 MG/2ML IJ SOLN
INTRAMUSCULAR | Status: AC
  Filled 2015-11-13: qty 2

## 2015-11-13 MED ORDER — ACETAMINOPHEN 325 MG PO TABS: 650.0000 mg | ORAL_TABLET | Freq: Four times a day (QID) | ORAL | Status: DC | PRN

## 2015-11-13 MED ORDER — ALPRAZOLAM 0.5 MG PO TABS: 0.5000 mg | ORAL_TABLET | Freq: Three times a day (TID) | ORAL | Status: DC | PRN

## 2015-11-13 MED ORDER — IOPAMIDOL (ISOVUE-300) INJECTION 61%
30.0000 mL | Freq: Once | INTRAVENOUS | Status: AC | PRN
  Administered 2015-11-13: 25 mL via INTRAVENOUS

## 2015-11-13 MED ORDER — MIDAZOLAM HCL 2 MG/2ML IJ SOLN
INTRAMUSCULAR | Status: AC | PRN
  Administered 2015-11-13: 1 mg via INTRAVENOUS
  Administered 2015-11-13 (×2): 0.5 mg via INTRAVENOUS

## 2015-11-13 MED ORDER — SODIUM CHLORIDE 0.9 % IV SOLN
INTRAVENOUS | Status: DC
  Administered 2015-11-13 – 2015-11-17 (×10): via INTRAVENOUS

## 2015-11-13 MED ORDER — LIDOCAINE HCL 1 % IJ SOLN
INTRAMUSCULAR | Status: AC
  Filled 2015-11-13: qty 20

## 2015-11-13 MED ORDER — HEPARIN (PORCINE) IN NACL 100-0.45 UNIT/ML-% IJ SOLN
2000.0000 [IU]/h | INTRAMUSCULAR | Status: DC
  Administered 2015-11-13: 2000 [IU]/h via INTRAVENOUS
  Filled 2015-11-13: qty 250

## 2015-11-13 MED ORDER — ONDANSETRON HCL 4 MG PO TABS
8.0000 mg | ORAL_TABLET | Freq: Three times a day (TID) | ORAL | Status: DC | PRN
  Administered 2015-11-15: 8 mg via ORAL
  Filled 2015-11-13: qty 2

## 2015-11-13 MED ORDER — OXYCODONE HCL ER 15 MG PO T12A
30.0000 mg | EXTENDED_RELEASE_TABLET | Freq: Two times a day (BID) | ORAL | Status: DC
  Administered 2015-11-13 – 2015-11-17 (×9): 30 mg via ORAL
  Filled 2015-11-13 (×9): qty 2

## 2015-11-13 MED ORDER — IOPAMIDOL (ISOVUE-300) INJECTION 61%
10.0000 mL | Freq: Once | INTRAVENOUS | Status: AC | PRN
  Administered 2015-11-13: 5 mL via INTRAVENOUS

## 2015-11-13 MED ORDER — SODIUM CHLORIDE 0.9% FLUSH
3.0000 mL | Freq: Two times a day (BID) | INTRAVENOUS | Status: DC
  Administered 2015-11-13 – 2015-11-17 (×6): 3 mL via INTRAVENOUS

## 2015-11-13 MED ORDER — SERTRALINE HCL 50 MG PO TABS
50.0000 mg | ORAL_TABLET | Freq: Two times a day (BID) | ORAL | Status: DC
  Administered 2015-11-13 – 2015-11-17 (×9): 50 mg via ORAL
  Filled 2015-11-13 (×9): qty 1

## 2015-11-13 MED ORDER — ONDANSETRON HCL 4 MG/2ML IJ SOLN
4.0000 mg | Freq: Four times a day (QID) | INTRAMUSCULAR | Status: DC | PRN
  Administered 2015-11-13: 8 mg via INTRAVENOUS
  Filled 2015-11-13: qty 4

## 2015-11-13 MED ORDER — TBO-FILGRASTIM 480 MCG/0.8ML ~~LOC~~ SOSY
480.0000 ug | PREFILLED_SYRINGE | Freq: Every day | SUBCUTANEOUS | Status: DC
  Administered 2015-11-14 – 2015-11-16 (×3): 480 ug via SUBCUTANEOUS
  Filled 2015-11-13 (×5): qty 0.8

## 2015-11-13 MED ORDER — SENNOSIDES-DOCUSATE SODIUM 8.6-50 MG PO TABS
1.0000 | ORAL_TABLET | Freq: Two times a day (BID) | ORAL | Status: DC
  Administered 2015-11-14 – 2015-11-17 (×5): 1 via ORAL
  Filled 2015-11-13 (×8): qty 1

## 2015-11-13 MED ORDER — SENNOSIDES-DOCUSATE SODIUM 8.6-50 MG PO TABS: 2.0000 | ORAL_TABLET | Freq: Two times a day (BID) | ORAL | Status: DC

## 2015-11-13 MED ORDER — OXYCODONE HCL 5 MG PO TABS
10.0000 mg | ORAL_TABLET | ORAL | Status: DC | PRN
  Administered 2015-11-13 – 2015-11-17 (×16): 10 mg via ORAL
  Filled 2015-11-13 (×16): qty 2

## 2015-11-13 MED ORDER — FENTANYL CITRATE (PF) 100 MCG/2ML IJ SOLN
INTRAMUSCULAR | Status: AC | PRN
  Administered 2015-11-13 (×2): 50 ug via INTRAVENOUS

## 2015-11-13 MED ORDER — HEPARIN (PORCINE) IN NACL 100-0.45 UNIT/ML-% IJ SOLN
1700.0000 [IU]/h | INTRAMUSCULAR | Status: DC
  Administered 2015-11-13 – 2015-11-14 (×3): 1700 [IU]/h via INTRAVENOUS
  Filled 2015-11-13 (×4): qty 250

## 2015-11-13 NOTE — Progress Notes (Signed)
ANTICOAGULATION CONSULT NOTE - Follow Up Consult  Pharmacy Consult for Heparin Indication: pulmonary embolus and DVT  No Known Allergies  Patient Measurements: Height: 5\' 9"  (175.3 cm) Weight: 181 lb 7 oz (82.3 kg) IBW/kg (Calculated) : 70.7 Heparin Dosing Weight: using total body weight  Vital Signs: Temp: 99.1 F (37.3 C) (05/17 0800) Temp Source: Oral (05/17 0800) BP: 133/89 mmHg (05/17 0600) Pulse Rate: 107 (05/17 0600)  Labs:  Recent Labs  11/12/15 2011 11/13/15 0130 11/13/15 0816  HGB 11.1* 10.2*  --   HCT 33.3* 31.2*  --   PLT 210 189  --   APTT  --   --  157*  LABPROT 27.4*  --   --   INR 2.59*  --   --   HEPARINUNFRC  --   --  1.20*  CREATININE 0.82 0.73 0.74  TROPONINI 0.03  --   --     Estimated Creatinine Clearance: 119.1 mL/min (by C-G formula based on Cr of 0.74).   Assessment: 74 yoM with history of metastatic pancreatic cancer, currently receiving chemotherapy, with recent PE/DVT noted on 10/28/2015 admit to [facility]. Patient was started on IV heparin infusion during that admission (held briefly for procedures - liver biopsy and PAC placement) and then transitioned to Xarelto on 5/9 prior to discharge on 5/11.  Patient presents to [facility] ED on 5/16 with worsening SOB and CP, now has progression of bilateral extensive PE with possible right heart strain.  Patient was taking rivaroxaban 15mg  po bid PTA with last dose noted 5/16 at 1700 on med history. Patient states that he has been compliant with his medications.  PTT ordered with Heparin level until both correlate due to possible drug-lab interaction between oral anticoagulant (rivaroxaban, edoxaban, or apixaban) and anti-Xa level (aka heparin level).  Significant events: 5/17 bilateral lower extremity dopplers not acute RE DVT.  IR consulted to evaluate for IVC filter.  Today, 11/13/2015: - aPTT supratherapeutic at 157 seconds with infusion at 2000 units/hr.  Noted that during previous admission, patient was  obtaining therapeutic heparin levels with heparin infusion at 2300 units/hr. - Heparin level elevated as well as would expected due to lingering Xarelto effect; however, aPTT elevated at this time and levels may actually be correlating. - SCr WNL, CrCl>100 ml/min so would anticipate quick elimination of Xarelto - Dr Alvy Bimler, who is managing patient's stage IV pancreatic cancer, recommending IVC filter placement in addition to continued anticoagulation due to concern for further pulmonary embolisms.  She recommends transitioning to Lovenox once stable. - No bleeding/complications reported.   Goal of Therapy:  Heparin level 0.3-0.7 units/ml aPTT 66-102 seconds Monitor platelets by anticoagulation protocol: Yes   Plan:  Hold heparin infusion x 1 hour. Then resume heparin infusion at reduced rate of 1700 units/hr. Check aPTT and heparin level in 6 hours.  Hershal Coria 11/13/2015,9:07 AM

## 2015-11-13 NOTE — ED Notes (Signed)
Hospitalist at bedside 

## 2015-11-13 NOTE — Sedation Documentation (Signed)
Patient denies pain and is resting comfortably.  

## 2015-11-13 NOTE — Progress Notes (Signed)
ANTICOAGULATION CONSULT NOTE - Initial Consult  Pharmacy Consult for Heparin Indication: pulmonary embolus and DVT  No Known Allergies  Patient Measurements: Height: 5' 9"  (175.3 cm) Weight: 181 lb 7 oz (82.3 kg) IBW/kg (Calculated) : 70.7 Heparin Dosing Weight:   Vital Signs: Temp: 98.6 F (37 C) (05/17 0400) Temp Source: Oral (05/17 0400) BP: 141/87 mmHg (05/17 0024) Pulse Rate: 118 (05/17 0024)  Labs:  Recent Labs  11/12/15 2011 11/13/15 0130  HGB 11.1* 10.2*  HCT 33.3* 31.2*  PLT 210 189  LABPROT 27.4*  --   INR 2.59*  --   CREATININE 0.82 0.73  TROPONINI 0.03  --     Estimated Creatinine Clearance: 119.1 mL/min (by C-G formula based on Cr of 0.73).   Medical History: Past Medical History  Diagnosis Date  . Abdominal hernia   . Metastatic cancer ([place]) 10/28/2015    Medications:  Prescriptions prior to admission  Medication Sig Dispense Refill Last Dose  . lidocaine-prilocaine (EMLA) cream Apply to affected area once 30 g 3 11/11/2015 at Unknown time  . oxyCODONE 10 MG TABS Take 1 tablet (10 mg total) by mouth every 3 (three) hours as needed for moderate pain, severe pain or breakthrough pain. 60 tablet 0 11/12/2015 at Unknown time  . oxyCODONE 30 MG 12 hr tablet Take 30 mg by mouth every 12 (twelve) hours. 60 tablet 0 11/12/2015 at Unknown time  . prochlorperazine (COMPAZINE) 10 MG tablet Take 1 tablet (10 mg total) by mouth every 6 (six) hours as needed (NAUSEA). 30 tablet 1 11/12/2015 at Unknown time  . Rivaroxaban (XARELTO) 15 MG TABS tablet Take 1 tablet (15 mg total) by mouth 2 (two) times daily with a meal. 42 tablet 0 11/12/2015 at 1700  . senna-docusate (SENOKOT-S) 8.6-50 MG tablet Take 2 tablets by mouth 2 (two) times daily. 120 tablet 0 11/12/2015 at Unknown time  . sertraline (ZOLOFT) 50 MG tablet Take 2 tablets (100 mg total) by mouth daily. (Patient taking differently: Take 50 mg by mouth 2 (two) times daily. ) 30 tablet 3 11/12/2015 at Unknown time  .  sorbitol 70 % SOLN Take 40 mLs by mouth 2 (two) times daily. 473 mL 1 11/12/2015 at Unknown time  . ALPRAZolam (XANAX) 0.5 MG tablet Take 1 tablet (0.5 mg total) by mouth 3 (three) times daily as needed for anxiety (try first dose now if patient thinks he needs it. thank you). (Patient not taking: Reported on 11/12/2015) 75 tablet 0 Not Taking at Unknown time  . ondansetron (ZOFRAN) 8 MG tablet Take 1 tablet (8 mg total) by mouth every 8 (eight) hours as needed for nausea, vomiting or refractory nausea / vomiting. 30 tablet 1   . Rivaroxaban (XARELTO STARTER PACK) 15 & 20 MG TBPK Take as directed on package: Start with one 86m tablet by mouth twice a day with food. On Day 22, switch to one 293mtablet once a day with food. 51 each 0 Taking  . rivaroxaban (XARELTO) KIT 1 kit by Does not apply route once. 1 kit 0 unknown at unknown   Scheduled:  . oxyCODONE  30 mg Oral Q12H  . sertraline  50 mg Oral BID  . sodium chloride flush  3 mL Intravenous Q12H    Assessment: Patient with recent PE/DVT noted on 10/28/2015 admit.  Patient in ED with worsening SOB and CP.  MD notes PE has worsened.  Patient was taking rivaroxaban 1527mo bid PTA with 2nd dose noted 5/16 at 1700 on  med rec. PTT ordered with Heparin level until both correlate due to possible drug-lab interaction between oral anticoagulant (rivaroxaban, edoxaban, or apixaban) and anti-Xa level (aka heparin level)   Goal of Therapy:  Heparin level 0.3-0.7 units/ml aPTT 66-102 seconds Monitor platelets by anticoagulation protocol: Yes   Plan:  Heparin drip at 2000 units/hr Daily CBC Heparin level/PTT at 0900  Tyler Deis, Shea Stakes [place] 11/13/2015,4:48 AM

## 2015-11-13 NOTE — Progress Notes (Signed)
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 by a transitional dilator. This allowed insertion of a guide wire and catheter into the IVC. Measurements were obtained from the SVC / RA junction back to the right IJ venotomy site. In the right infraclavicular chest, a subcutaneous pocket was created over the second anterior rib. This was done under sterile conditions and local anesthesia. 1% lidocaine with epinephrine was utilized for this. A 2.5 cm incision was made in the skin. Blunt dissection was performed to create a subcutaneous pocket over the right pectoralis major muscle. The pocket was flushed with saline vigorously. There was adequate hemostasis. The port catheter was assembled and checked for leakage. The port catheter was secured in the pocket with two retention sutures. The tubing was tunneled subcutaneously to the right venotomy site and inserted into the SVC/RA junction through a valved peel-away sheath. Position was confirmed with fluoroscopy. Images were obtained for documentation. The patient tolerated the procedure well. No immediate complications. Incisions were closed in a two layer fashion with 4 - 0 Vicryl suture. Dermabond was applied to the skin. The port  catheter was accessed, blood was aspirated followed by saline and heparin flushes. Needle was removed. A dry sterile dressing was applied. IMPRESSION: Ultrasound and fluoroscopically guided right internal jugular single lumen power port catheter insertion. Tip in the SVC/RA junction. Catheter ready for use. Electronically Signed   By: Jerilynn Mages.  Shick M.D.   On: 11/04/2015 12:55   Dg Abd Portable 1v  11/03/2015  CLINICAL DATA:  Right upper quadrant pain and constipation. Pancreatic carcinoma. EXAM: PORTABLE ABDOMEN - 1 VIEW COMPARISON:  CT abdomen and pelvis Oct 28, 2015 FINDINGS: There is moderate stool throughout the colon. There is no bowel dilatation or air-fluid level suggesting obstruction.  No free air. Small phlebolith in left pelvis. IMPRESSION: No bowel obstruction or free air. Moderate stool throughout colon. Colon does not appear distended with stool. Electronically Signed   By: Lowella Grip III M.D.   On: 11/03/2015 10:13   Dg Foot Complete Left  10/30/2015  CLINICAL DATA:  Left foot pain for 3 days.  No known injury. EXAM: LEFT FOOT - COMPLETE 3+ VIEW COMPARISON:  None. FINDINGS: There is no evidence of fracture or dislocation. There is no evidence of arthropathy or other focal bone abnormality. IMPRESSION: Negative. Electronically Signed   By: Earle Gell M.D.   On: 10/30/2015 12:24   Dg Colon W/water Sol Cm  11/06/2015  CLINICAL DATA:  Metastatic pancreatic cancer.  Severe constipation. EXAM: COLON WITH WATER SOLUTION CONTRAST COMPARISON:  CT scan 10/28/2015. FINDINGS: Initial scout image demonstrates a large amount of air throughout the colon. There is also some stool in the right and transverse colon. Possible colonic ileus. No intrinsic or extrinsic lesions of the colon are identified. Mild to moderate dilatation of the ascending and transverse colon. IMPRESSION: Moderate stool in a dilated right and transverse colon. Air throughout the rest of the colon. No obstruction or mass. Electronically  Signed   By: Marijo Sanes M.D.   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 I will get my staff nurse to disconnect his IV pump now. Continue supportive care. He will miss his dose of Neulasta today. I will start him on G-CSF daily tomorrow to prevent risk of neutropenic fever in the near future  Acute, worsening pulmonary embolism ([place]) with possible heart strain He has extensive DVT and PE and was on Xarelto. He is now admitted with evidence of new blood clots Echocardiogram is pending. He is currently on IV heparin. I suspect that he had new clot burden from lower extremities that might have traveled to the lungs, exacerbation by recent significant immobility, active cancer and dehydration. In this situation, I felt it is prudent to consider IVC filter placement to prevent further PE. Patients with pancreatic cancer are prone to get blood clots that may not respond well to conventional anticoagulation treatment. After the next 24-48 hours, if he has no evidence of severe cardiovascular compromise requiring thrombectomy, he can be transitioned to Lovenox Lovenox has the stronger data against warfarin for treatment of DVT/PE among cancer patients. However, we have to balance with the risk of the cost. The patient did not have Medicaid yet. We will consult care management will help in this regard.  Severe nausea and dehydration Could be related to side effects of treatment, his cancer and side effects from chemotherapy. I will get his IV chemotherapy discontinued and continue anti-emetics and IV fluid support  Cancer associated pain and chest pain from recent PE He is currently taking combination therapy with extended release pain medicine and immediate release oxycodone for breakthrough pain medicine. Continue the same.  Constipation due  to opioid therapy, resolved Continue aggressive regimen with laxatives  Protein-calorie malnutrition, moderate ([place]) He has lost a lot of weight with poor oral intake due to cancer diagnosis. I reinforced the importance of frequent small meals.  Encounter for palliative care The patient is aware he has stage IV disease and treatment is strictly palliative. We discussed importance of Advanced Directives and Living will. We discussed CODE STATUS in the  past; the patient desires to remain in full code. He has referral set up for palliative care service to follow as an outpatient  Discharge planning Not ready for discharge due to cardiovascular instability. I will continue to follow while he is hospitalized  All questions were answered. The patient knows to call the clinic with any problems, questions or concerns.    [facility], [place], MD 11/13/2015 9:43 AM

## 2015-11-13 NOTE — Progress Notes (Signed)
Pt in ICU.  Dr. Alvy Bimler ordered Chemo 5FU pump d/c'd by chemo RN.   Pump d/c'd by this RN at 10:05 am.  21.5 ml residual volume left at time of d/c.   PAC dressing was loose at the top.  This RN de-accessed PAC needle and re-accessed w/ new 20 g Huber needle and sterile, secure dressing/ biopatch for use in patient.  Good blood return noted.  Pt tolerated procedure well.

## 2015-11-13 NOTE — Progress Notes (Signed)
VASCULAR LAB PRELIMINARY  PRELIMINARY  PRELIMINARY  PRELIMINARY  Bilateral lower extremity venous duplex completed.    Preliminary report:  Right: Acute DVT noted in the peroneal vein .  No evidence of superficial thrombosis.  No Baker's cyst. Left:  Acute DVT noted in the posterior tibial and peroneal veins.  There is also evidence of acute superficial thrombosis noted in the lesser saphenous vein.  No Baker's cyst.  There has been no significant change since the study earlier this month 10/30/2015  Pamela Maddy, RVS 11/13/2015, 10:09 AM

## 2015-11-13 NOTE — Progress Notes (Signed)
Patient ID: Nathan [name], male   DOB: 23-May-1971, 45 y.o.   MRN: [id number]    Referring Physician(s): Gorsuch,Ni/Thompson,Daniel  Supervising Physician: Aletta Edouard  Patient Status: In-pt  Chief Complaint: Progressive PE on xarelto, DVT, stage IV pancreatic cancer   Subjective: Patient familiar to IR service from prior liver lesion biopsy on 11/01/15 as well as right chest wall Port-A-Cath on 11/04/15. He has a known history of stage IV pancreatic cancer. He is status post recent admission from 5/1-5/11/17 were he was found to have a billateral PE and bilateral DVTs. He was initially placed on IV heparin and transitioned to Xarelto part of discharge home. He recently underwent first palliative chemotherapy treatment on 5/14 resulted in profound nausea/vomiting and weakness. This eventually progressed to chest pain and dyspnea and he was readmitted. Follow-up CT angio chest on 5/16 revealed progressive extensive bilateral pulmonary emboli with right heart strain. Follow-up lower extremity venous Doppler today revealed no significant change since 5/3 study with bilateral DVTs noted in the right peroneal vein and left posterior tibial/peroneal veins. Request now made for IVC filter placement. He currently denies fever, headache, back pain, abnormal bleeding. He continues to have some intermittent chest discomfort, occasional epigastric discomfort and nausea/vomiting yesterday but none today.  Allergies: Review of patient's allergies indicates no known allergies.  Medications: Prior to Admission medications   Medication Sig Start Date End Date Taking? Authorizing Provider  lidocaine-prilocaine (EMLA) cream Apply to affected area once 11/08/15  Yes Tiffany Daneil Dan, PA-C  oxyCODONE 10 MG TABS Take 1 tablet (10 mg total) by mouth every 3 (three) hours as needed for moderate pain, severe pain or breakthrough pain. 11/07/15  Yes Nita Sells, MD  oxyCODONE 30 MG 12 hr tablet Take 30 mg by  mouth every 12 (twelve) hours. 11/07/15  Yes Nita Sells, MD  prochlorperazine (COMPAZINE) 10 MG tablet Take 1 tablet (10 mg total) by mouth every 6 (six) hours as needed (NAUSEA). 11/08/15  Yes Heath Lark, MD  Rivaroxaban (XARELTO) 15 MG TABS tablet Take 1 tablet (15 mg total) by mouth 2 (two) times daily with a meal. 11/07/15  Yes Nita Sells, MD  senna-docusate (SENOKOT-S) 8.6-50 MG tablet Take 2 tablets by mouth 2 (two) times daily. 11/07/15  Yes Nita Sells, MD  sertraline (ZOLOFT) 50 MG tablet Take 2 tablets (100 mg total) by mouth daily. Patient taking differently: Take 50 mg by mouth 2 (two) times daily.  11/08/15  Yes Tiffany Daneil Dan, PA-C  sorbitol 70 % SOLN Take 40 mLs by mouth 2 (two) times daily. 11/07/15  Yes Nita Sells, MD  ALPRAZolam Duanne Moron) 0.5 MG tablet Take 1 tablet (0.5 mg total) by mouth 3 (three) times daily as needed for anxiety (try first dose now if patient thinks he needs it. thank you). Patient not taking: Reported on 11/12/2015 11/07/15   Nita Sells, MD  ondansetron (ZOFRAN) 8 MG tablet Take 1 tablet (8 mg total) by mouth every 8 (eight) hours as needed for nausea, vomiting or refractory nausea / vomiting. 11/08/15   Brayton Caves, PA-C  Rivaroxaban (XARELTO STARTER PACK) 15 & 20 MG TBPK Take as directed on package: Start with one 5m tablet by mouth twice a day with food. On Day 22, switch to one 243mtablet once a day with food. 11/07/15   JaNita SellsMD  rivaroxaban (XARELTO) KIT 1 kit by Does not apply route once. 11/07/15   JaNita SellsMD     Vital Signs: BP 134/102 mmHg  Pulse 106  Temp(Src) 99.1 F (37.3 C) (Oral)  Resp 19  Ht 5' 9"  (1.753 m)  Wt 181 lb 7 oz (82.3 kg)  BMI 26.78 kg/m2  SpO2 97%  Physical Exam patient awake, alert. Chest clear to auscultation bilaterally. Clean, intact right chest wall Port-A-Cath; heart tachycardic but regular rhythm; abdomen soft, positive bowel sounds, mildly tender  epigastric region. No significant lower extremity edema.  Imaging: Dg Chest 2 View  11/12/2015  CLINICAL DATA:  Pancreatic cancer.  Chest pain. EXAM: CHEST  2 VIEW COMPARISON:  CT chest 10/30/2015 FINDINGS: There is a right-sided Port-A-Cath in satisfactory position. There is mild lingular atelectasis. The lungs are otherwise clear. There is no pleural effusion or pneumothorax. The heart and mediastinal contours are unremarkable. The osseous structures are unremarkable. IMPRESSION: No active cardiopulmonary disease. Electronically Signed   By: Kathreen Devoid   On: 11/12/2015 20:41   Ct Angio Chest Pe W/cm &/or Wo Cm  11/12/2015  ADDENDUM REPORT: 11/12/2015 23:02 ADDENDUM: Please note the study was compared to the chest CT dated 10/30/2015 Overall there has been interval progression of the scratch the pulmonary emboli on today's study compared to prior study. For example there is new pulmonary embolus in the right middle lobe segmental branch (series 4, image 44) not present on the prior study. Right upper lobe pulmonary artery embolus also appears more progressed compared the prior study. Evaluation of today's study is however somewhat limited due to respiratory motion artifact. These results were called by telephone at the time of interpretation on 11/12/2015 at 11:02 pm to Dr. Davonna Belling , who verbally acknowledged these results. Electronically Signed   By: Anner Crete M.D.   On: 11/12/2015 23:02  11/12/2015  CLINICAL DATA:  Follow up 45 year old male with chest pain. History of cancer. Concern for PE. EXAM: CT ANGIOGRAPHY CHEST WITH CONTRAST TECHNIQUE: Multidetector CT imaging of the chest was performed using the standard protocol during bolus administration of intravenous contrast. Multiplanar CT image reconstructions and MIPs were obtained to evaluate the vascular anatomy. CONTRAST:  100 cc Isovue 370 COMPARISON:  Chest radiograph dated 11/12/2015 and chest CT dated 10/30/2006 FINDINGS: There  are linear bibasilar atelectasis/ scarring. There is mild centrilobular emphysema. The lungs are clear. There is no pleural effusion or pneumothorax. The central airways are patent. The thoracic aorta and the origins of the great vessels of the aortic arch appear patent. Evaluation of this exam is limited due to respiratory motion artifact. There are filling defects within the segmental and subsegmental branches of the right middle and right lower lobes. Right upper lobe segmental and subsegmental filling defects are also noted. There are filling defects within the left upper and left lower lobe pulmonary artery vasculature extending from the segmental vessels distally. There is mild dilatation of the right ventricle with retrograde flow of contrast into the IVC concerning for a degree of right cardiac straining. Correlation with clinical exam and echocardiogram recommended. There is mild cardiomegaly. No pericardial effusion. Subcarinal lymph node measures 1.4 cm in short axis. No definite hilar adenopathy. The esophagus and the thyroid gland appear grossly unremarkable. There is no axillary adenopathy. Right pectoral Port-A-Cath with tip at the cavoatrial junction. The chest wall soft tissues appear unremarkable. The osseous structures are intact. There are multiple hepatic hypodense lesions measuring up to 4.2 x 4.1 cm in the dome of the liver most compatible with metastatic disease. Review of the MIP images confirms the above findings. IMPRESSION: Extensive bilateral pulmonary artery emboli with findings  concerning for a degree of right cardiac straining. Correlation with clinical exam and echocardiogram recommended. Mildly enlarged subcarinal lymph node measuring 1.4 cm short axis. Extensive hepatic metastatic disease. Electronically Signed: By: Anner Crete M.D. On: 11/12/2015 22:51    Labs:  CBC:  Recent Labs  11/06/15 0536 11/07/15 0647 11/12/15 2011 11/13/15 0130  WBC 13.5* 14.3* 17.5*  14.8*  HGB 11.3* 11.4* 11.1* 10.2*  HCT 35.4* 35.4* 33.3* 31.2*  PLT 235 242 210 189    COAGS:  Recent Labs  10/29/15 1105 10/30/15 0527 10/31/15 0207 11/12/15 2011 11/13/15 0816  INR 1.65* 1.40 1.37 2.59*  --   APTT 38*  --   --   --  157*    BMP:  Recent Labs  11/05/15 0747 11/12/15 2011 11/13/15 0130 11/13/15 0816  NA 134* 131* 132* 134*  K 4.4 4.2 4.4 4.6  CL 96* 98* 100* 101  CO2 25 23 24 24   GLUCOSE 118* 129* 121* 110*  BUN 7 18 17 15   CALCIUM 9.8 9.8 9.2 9.4  CREATININE 0.80 0.82 0.73 0.74  GFRNONAA >60 >60 >60 >60  GFRAA >60 >60 >60 >60    LIVER FUNCTION TESTS:  Recent Labs  10/31/15 0207 11/05/15 0747 11/12/15 2011 11/13/15 0130  BILITOT 0.8 1.1 1.5* 1.7*  AST 36 31 42* 35  ALT 32 34 44 39  ALKPHOS 155* 273* 399* 367*  PROT 6.3* 7.6 7.7 7.2  ALBUMIN 2.7* 2.9* 2.9* 2.8*    Assessment and Plan: Patient with history of stage IV colon cancer, bilateral lower extremity DVTs and progressive bilateral PE despite xarelto therapy. Patient currently on IV heparin therapy. Request now received for IVC filter placement. Case reviewed by Dr. Kathlene Cote. Risks and benefits discussed with the patient/mother including, but not limited to bleeding, infection, contrast induced renal failure, filter fracture or migration which can lead to emergency surgery or even death, strut penetration with damage or irritation to adjacent structures and caval thrombosis.All of the patient's questions were answered, patient is agreeable to proceed.Consent signed and in chart.Procedure scheduled for later today. Labs noted- check f/u PT/INR.      Electronically Signed: D. Rowe Robert 11/13/2015, 11:48 AM   I spent a total of 30 minutes at the the patient's bedside AND on the patient's hospital floor or unit, greater than 50% of which was counseling/coordinating care for IVC filter placement

## 2015-11-13 NOTE — Progress Notes (Signed)
PROGRESS NOTE    Nathan [name]  [id number] DOB: 1970-09-21 DOA: 11/12/2015 PCP: Almond Lint, MD   Brief Narrative:  Nathan [name] is a 45 y.o. 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  Pt reports that he received his first treatment with chemotherapy in cancer center 11/12/2015 . He experienced mild hypersensitivity reaction. He had confusion, nausea, vomiting and sweating. He was treated with benadryl, Pepcid, and Solu-Medrol. All symptoms resolved and patient was able to complete all of his chemotherapy with no further issue per Dr. Calton Dach note. Later on, he developed chest pain and SOB. His chest pain is located in the front chest, radiating to the back and epigastric area. It is constant, 8 out of 10 in severity, sharp pain. Patient does not have fever or chills, no cough. He also has abdominal pain, which is at his baseline. He reported that he had 3 bowel movement with loose stool on the day of admission. Patient did not have fever, chills, symptoms of UTI or unilateral weakness.  ED Course: pt was found to have lactate 1.34, troponin negative, lipase 97, WBC 17.5, temperature normal, oxygen saturation 94% on room air, tachycardia, renal function normal, negative chest x-ray. CT angiogram showed worsening extensive bilateral PE with evidence of right heart straining. Pt is admitted to [facility] as inpt. PCCM, Dr. Lavina Hamman was consulted by EDP.   Principal Problem:   Pulmonary embolism (HCC) Active Problems:   Carcinoma of pancreas metastatic to liver [facility])   Encounter for palliative care   Tachycardia   Protein-calorie malnutrition, moderate (HCC)   DVT (deep venous thrombosis) (HCC)   Nausea, vomiting, and diarrhea   PE (pulmonary embolism)   Depression with anxiety    Leukocytosis  #1 worsening bilateral pulmonary emboli with possible right heart strain Patient had presented with chest pain and worsening shortness of breath. Patient with history of stage IV pancreatic cancer with metastases to the liver on chemotherapy, history of recent bilateral PE and bilateral DVT on Xarelto. Patient states has been compliant with all medications. Worsening pulmonary emboli may be secondary to mobile DVT. Remain in the stepdown unit. Continue IV heparin. 2-D echo pending. Lower extremity Dopplers pending. Continue O2 to keep sats greater than 92%. Consult with IR for evaluation to see whether patient is a candidate for IVC filter. Patient will likely need to be on full dose Lovenox in the outpatient setting on when transitioned from IV heparin. Will need to have case manager assess for medication needs. Will consult with patient's oncologist, Dr. Alvy Bimler for further evaluation and management. [facility] following.  #2 tachycardia Likely secondary to problem #1.  #3 stage IV pancreatic cancer with metastases to the liver Patient received first chemotherapy yesterday as palliative treatment and had a mild hypersensitivity reactions which has since resolved. Patient to have chemotherapy discontinued today. Consult with Dr. Alvy Bimler of oncology for further evaluation and management.  #4 bilateral DVT Repeat lower extremity Dopplers pending. IV heparin.  #5 nausea vomiting diarrhea abdominal pain Abdominal pain likely due to pancreatic cancer. Patient denies any further nausea vomiting or diarrhea. Senokot-S and sorbitol on hold. IV fluids. Pain management. Supportive care. Place on clear liquid diet and advance as tolerated.  #6 moderate protein calorie malnutrition Nutritional supplementation.  #7 depression/anxiety Continue Zoloft.Resume home regimen of xanax.  #8 leukocytosis Likely secondary to  problem #1. CT chest negative for infiltrate. Patient also received a dose of IV  Solu-Medrol prior to admission while receiving his chemotherapy due to hypersensitivity reaction. Patient with no urinary symptoms. WBC trending down. Monitor for now.   DVT prophylaxis: On IV heparin. Code Status: Full Family Communication: Updated patient and her mother at bedside. Disposition Plan: Remain inpatient.   Consultants:   PCCM: Georgann Housekeeper, NP  11/13/2015  Oncology/hematology: Dr. Alvy Bimler 11/13/2015  Procedures:   CT angiogram chest 11/12/2015  Chest x-ray 11/12/2015    Antimicrobials:   None   Subjective: Patient states chest pain has improved. Patient states no significant change with shortness of breath. No nausea, no vomiting, no diarrhea.  Objective: Filed Vitals:   11/13/15 0024 11/13/15 0400 11/13/15 0600 11/13/15 0800  BP: 141/87 128/93 133/89 144/105  Pulse: 118 109 107 115  Temp: 97.2 F (36.2 C) 98.6 F (37 C)  99.1 F (37.3 C)  TempSrc: Oral Oral  Oral  Resp:  24 31 19   Height: 5\' 9"  (1.753 m)     Weight: 82.3 kg (181 lb 7 oz)     SpO2:  96% 96% 97%    Intake/Output Summary (Last 24 hours) at 11/13/15 0908 Last data filed at 11/13/15 0900  Gross per 24 hour  Intake 1143.33 ml  Output    950 ml  Net 193.33 ml   Filed Weights   11/12/15 1956 11/13/15 0024  Weight: 87.091 kg (192 lb) 82.3 kg (181 lb 7 oz)    Examination:  General exam: Appears calm and comfortable  Respiratory system: Clear to auscultation. Respiratory effort normal. Cardiovascular system: Tachycardic. No JVD, murmurs, rubs, gallops or clicks. No pedal edema. Gastrointestinal system: Abdomen is nondistended, soft and nontender. No organomegaly or masses felt. Normal bowel sounds heard.  Central nervous system: Alert and oriented. No focal neurological deficits. Extremities: Symmetric 5 x 5 power. Skin: No rashes, lesions or ulcers Psychiatry: Judgement and insight appear normal. Mood & affect appropriate.     Data Reviewed: I have personally reviewed  following labs and imaging studies  CBC:  Recent Labs Lab 11/07/15 0647 11/12/15 2011 11/13/15 0130  WBC 14.3* 17.5* 14.8*  HGB 11.4* 11.1* 10.2*  HCT 35.4* 33.3* 31.2*  MCV 84.9 80.4 81.7  PLT 242 210 [id number]   Basic Metabolic Panel:  Recent Labs Lab 11/12/15 2011 11/13/15 0130 11/13/15 0816  NA 131* 132* 134*  K 4.2 4.4 4.6  CL 98* 100* 101  CO2 23 24 24   GLUCOSE 129* 121* 110*  BUN 18 17 15   CREATININE 0.82 0.73 0.74  CALCIUM 9.8 9.2 9.4   GFR: Estimated Creatinine Clearance: 119.1 mL/min (by C-G formula based on Cr of 0.74). Liver Function Tests:  Recent Labs Lab 11/12/15 2011 11/13/15 0130  AST 42* 35  ALT 44 39  ALKPHOS 399* 367*  BILITOT 1.5* 1.7*  PROT 7.7 7.2  ALBUMIN 2.9* 2.8*    Recent Labs Lab 11/12/15 2011  LIPASE 79*   No results for input(s): AMMONIA in the last 168 hours. Coagulation Profile:  Recent Labs Lab 11/12/15 2011  INR 2.59*   Cardiac Enzymes:  Recent Labs Lab 11/12/15 2011  TROPONINI 0.03   BNP (last 3 results) No results for input(s): PROBNP in the last 8760 hours. HbA1C: No results for input(s): HGBA1C in the last 72 hours. CBG:  Recent Labs Lab 11/13/15 0734  GLUCAP 97   Lipid Profile: No results for input(s): CHOL, HDL, LDLCALC, TRIG, CHOLHDL, LDLDIRECT in the  last 72 hours. Thyroid Function Tests: No results for input(s): TSH, T4TOTAL, FREET4, T3FREE, THYROIDAB in the last 72 hours. Anemia Panel: No results for input(s): VITAMINB12, FOLATE, FERRITIN, TIBC, IRON, RETICCTPCT in the last 72 hours. Sepsis Labs:  Recent Labs Lab 11/12/15 2109  LATICACIDVEN 1.34    Recent Results (from the past 240 hour(s))  MRSA PCR Screening     Status: None   Collection Time: 11/13/15 12:25 AM  Result Value Ref Range Status   MRSA by PCR NEGATIVE NEGATIVE Final    Comment:        The GeneXpert MRSA Assay (FDA approved for NASAL specimens only), is one component of a comprehensive MRSA  colonization surveillance program. It is not intended to diagnose MRSA infection nor to guide or monitor treatment for MRSA infections.          Radiology Studies: Dg Chest 2 View  11/12/2015  CLINICAL DATA:  Pancreatic cancer.  Chest pain. EXAM: CHEST  2 VIEW COMPARISON:  CT chest 10/30/2015 FINDINGS: There is a right-sided Port-A-Cath in satisfactory position. There is mild lingular atelectasis. The lungs are otherwise clear. There is no pleural effusion or pneumothorax. The heart and mediastinal contours are unremarkable. The osseous structures are unremarkable. IMPRESSION: No active cardiopulmonary disease. Electronically Signed   By: Kathreen Devoid   On: 11/12/2015 20:41   Ct Angio Chest Pe W/cm &/or Wo Cm  11/12/2015  ADDENDUM REPORT: 11/12/2015 23:02 ADDENDUM: Please note the study was compared to the chest CT dated 10/30/2015 Overall there has been interval progression of the scratch the pulmonary emboli on today's study compared to prior study. For example there is new pulmonary embolus in the right middle lobe segmental branch (series 4, image 44) not present on the prior study. Right upper lobe pulmonary artery embolus also appears more progressed compared the prior study. Evaluation of today's study is however somewhat limited due to respiratory motion artifact. These results were called by telephone at the time of interpretation on 11/12/2015 at 11:02 pm to Dr. Davonna Belling , who verbally acknowledged these results. Electronically Signed   By: Anner Crete M.D.   On: 11/12/2015 23:02  11/12/2015  CLINICAL DATA:  Follow up 45 year old male with chest pain. History of cancer. Concern for PE. EXAM: CT ANGIOGRAPHY CHEST WITH CONTRAST TECHNIQUE: Multidetector CT imaging of the chest was performed using the standard protocol during bolus administration of intravenous contrast. Multiplanar CT image reconstructions and MIPs were obtained to evaluate the vascular anatomy. CONTRAST:  100  cc Isovue 370 COMPARISON:  Chest radiograph dated 11/12/2015 and chest CT dated 10/30/2006 FINDINGS: There are linear bibasilar atelectasis/ scarring. There is mild centrilobular emphysema. The lungs are clear. There is no pleural effusion or pneumothorax. The central airways are patent. The thoracic aorta and the origins of the great vessels of the aortic arch appear patent. Evaluation of this exam is limited due to respiratory motion artifact. There are filling defects within the segmental and subsegmental branches of the right middle and right lower lobes. Right upper lobe segmental and subsegmental filling defects are also noted. There are filling defects within the left upper and left lower lobe pulmonary artery vasculature extending from the segmental vessels distally. There is mild dilatation of the right ventricle with retrograde flow of contrast into the IVC concerning for a degree of right cardiac straining. Correlation with clinical exam and echocardiogram recommended. There is mild cardiomegaly. No pericardial effusion. Subcarinal lymph node measures 1.4 cm in short axis. No definite hilar  adenopathy. The esophagus and the thyroid gland appear grossly unremarkable. There is no axillary adenopathy. Right pectoral Port-A-Cath with tip at the cavoatrial junction. The chest wall soft tissues appear unremarkable. The osseous structures are intact. There are multiple hepatic hypodense lesions measuring up to 4.2 x 4.1 cm in the dome of the liver most compatible with metastatic disease. Review of the MIP images confirms the above findings. IMPRESSION: Extensive bilateral pulmonary artery emboli with findings concerning for a degree of right cardiac straining. Correlation with clinical exam and echocardiogram recommended. Mildly enlarged subcarinal lymph node measuring 1.4 cm short axis. Extensive hepatic metastatic disease. Electronically Signed: By: Anner Crete M.D. On: 11/12/2015 22:51         Scheduled Meds: . oxyCODONE  30 mg Oral Q12H  . sertraline  50 mg Oral BID  . sodium chloride flush  3 mL Intravenous Q12H   Continuous Infusions: . sodium chloride 125 mL/hr at 11/13/15 0900  . heparin 2,000 Units/hr (11/13/15 0900)     LOS: 1 day    Time spent: 40 minutes    Fady Stamps, MD [facility] Pager [phone]  If 7PM-7AM, please contact night-coverage [email or web address] Password [email or web address] 11/13/2015, 9:08 AM

## 2015-11-13 NOTE — Procedures (Signed)
Interventional Radiology Procedure Note  Procedure:  IVC venography and IVC filter placement  Complications:  None  Access:  Right IJ vein  Estimated Blood Loss:  < 10 mL  Findings: IVC normally patent. [place] IVC filter placed in infrarenal IVC.    Nathan [name]. Kathlene Cote, M.D Pager:  [phone]

## 2015-11-13 NOTE — Progress Notes (Signed)
ANTICOAGULATION CONSULT NOTE - BRIEF NOTE (Heparin level/aPTT follow-up)  Pharmacy Consult for Heparin Indication: pulmonary embolus and DVT  Assessment: 11 yoM with history of metastatic pancreatic cancer, currently receiving chemotherapy, with recent PE/DVT noted on 10/28/2015 admit to [facility]. Patient was started on IV heparin infusion during that admission (held briefly for procedures - liver biopsy and PAC placement) and then transitioned to Xarelto on 5/9 prior to discharge on 5/11. Patient presents to [facility] ED on 5/16 with worsening SOB and CP, now has progression of bilateral extensive PE with possible right heart strain. Patient was taking rivaroxaban 15mg  po bid PTA with last dose noted 5/16 at 1700 on med history. Patient states that he has been compliant with his medications. IR placed IVC filter 5/17.  Today, 11/13/2015: - aPTT therapeutic at 86 seconds with infusion at 1700 units/hr follow rate reduce for elevated aPTT earlier today. Heparin level elevated but trending down, suspect related to xarelto   Noted that during previous admission, patient was obtaining therapeutic heparin levels with heparin infusion at 2300 units/hr. - SCr WNL, CrCl>100 ml/min so would anticipate quick elimination of Xarelto - IVC filter placed. She recommends transitioning to Lovenox once stable. - RN notes heparin not turned off during IVC filter - No bleeding/complications reported.   Goal of Therapy:  Heparin level 0.3-0.7 units/ml aPTT 66-102 seconds Monitor platelets by anticoagulation protocol: Yes  Plan:   Continue heparin at current rate of 1700 units/hr based on therapeutic aPTT  Recheck aPTT and heparin level tonight  Doreene Eland, PharmD, BCPS.   Pager: [phone]  11/13/2015 5:29 PM

## 2015-11-13 NOTE — Care Management Note (Signed)
Case Management Note  Patient Details  Name: AUSTAN ASHABRANNER MRN: [id number] Date of Birth: August 02, 1970  Subjective/Objective:                 Sepsis and bilateral pulmonary emboli   Action/Plan:Date:  Nov 13, 2015 Chart reviewed for concurrent status and case management needs. Will continue to follow patient for changes and needs: Expected discharge date: [id number] Velva Harman, BSN, [place], [place]   Expected Discharge Date:   (unknown)               Expected Discharge Plan:  Home/Self Care  In-House Referral:  NA  Discharge planning Services  CM Consult  Post Acute Care Choice:  NA Choice offered to:  NA  DME Arranged:  N/A DME Agency:  NA  HH Arranged:  NA HH Agency:  NA  Status of Service:  In process, will continue to follow  Medicare Important Message Given:    Date Medicare IM Given:    Medicare IM give by:    Date Additional Medicare IM Given:    Additional Medicare Important Message give by:     If discussed at [place] of Stay Meetings, dates discussed:    Additional Comments:  Leeroy Cha, RN 11/13/2015, 9:41 AM

## 2015-11-13 NOTE — Progress Notes (Addendum)
Initial Nutrition Assessment  DOCUMENTATION CODES:   Severe malnutrition in context of acute illness/injury  INTERVENTION:  - Diet advancement as medically feasible. - RD will follow-up 5/19 and assess for further needs at that time.   NUTRITION DIAGNOSIS:   Increased nutrient needs related to catabolic illness, cancer and cancer related treatments as evidenced by estimated needs.  GOAL:   Patient will meet greater than or equal to 90% of their needs  MONITOR:   PO intake, Diet advancement, Weight trends, Labs, Skin, I & O's  REASON FOR ASSESSMENT:   Consult Assessment of nutrition requirement/status  ASSESSMENT:   45 y.o. 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  Pt seen for consult. BMI indicates overweight status. Diet advanced from NPO to CLD around 0900 today and significant other, who is at bedside, states that pt has been unable to have anything despite diet advancement due to testing scheduled ~1200-1300. Pt with eyes closed during visit and is very minimally interactive, prefers for more in-depth conversation at another time. Pt states that since initiation of chemo on 11/11/15 he has had taste alteration which he is unable to specify other than "bad taste." Significant other states that since chemo initiation pt has only had a few bites of food and sips of liquids; pt states that poor appetite began prior to chemo initiation but is unable to specify time frame. He is able to state that he was eating poorly leading up to chemo initiation.   Unable to obtain further information at this time. Notes in chart indicate pt with N/V/D and abdominal pain PTA and that Dr. Alvy Bimler talked with pt about the importance of small, frequent meals. Unable to complete physical assessment at this  time with respect to pt's wishes; will complete at follow-up. Medications reviewed. Labs reviewed; K: 3.4 mmol/L, Alk Phos elevated.   ADDENDUM: Per chart review, pt has lost 13 lbs (6.7% body weight) in the past 16 days which is significant for time frame. Pt meets criteria for malnutrition based on weight loss and <50% PO intakes for >/= 5 days.   Diet Order:  Diet clear liquid Room service appropriate?: Yes; Fluid consistency:: Thin  Skin:  Wound (see comment) (R upper chest and R neck incisions from 11/04/15)  Last BM:  5/16  Height:   Ht Readings from Last 1 Encounters:  11/13/15 5' 9"  (1.753 m)    Weight:   Wt Readings from Last 1 Encounters:  11/13/15 181 lb 7 oz (82.3 kg)    Ideal Body Weight:  72.73 kg (kg)  BMI:  Body mass index is 26.78 kg/(m^2).  Estimated Nutritional Needs:   Kcal:  7341-9379 (28-32 kcal/kg)  Protein:  115-132 grams (1.4-1.6 grams/kg)  Fluid:  >/= 2.3 L/day  EDUCATION NEEDS:   No education needs identified at this time     Jarome Matin, RD, LDN Inpatient Clinical Dietitian Pager # [phone] After hours/weekend pager # [phone]

## 2015-11-13 NOTE — Consult Note (Signed)
Name: Nathan [name] MRN: [id number] DOB: July 02, 1970    ADMISSION DATE:  11/12/2015 CONSULTATION DATE:  11/13/2015  REFERRING MD :  EDP  CHIEF COMPLAINT:  SOB  BRIEF PATIENT DESCRIPTION: 45 year old male with recent dx of stage IV pancreatic cancer and subsequent pulmonary embolism with extenisve DVT during admission 10/28/2015. He was started on xarelto, however presented 5/16 with worsening SOB and chest pain. PE found to have worsened. PCCM consult.   SIGNIFICANT EVENTS  5/1-5/11 admit, diagnosed with stage IV pancreatic Ca and PE 5/16 admit with worsening PE.  STUDIES:  5/3 LE doppler > Findings consistent with acute deep vein thrombosis involving the right peroneal vein, left posterial tibial vein, and leftperoneal vein. Acute superficial vein thrombosis involving the left small saphenous vein. CTA chest 5/16 > Extensive bilateral pulmonary artery emboli with findings concerning for a degree of right cardiac straining. Clot burden worse since prior study. Correlation with clinical exam and echocardiogram recommended. Mildly enlarged subcarinal lymph node measuring 1.4 cm short axis. Extensive hepatic metastatic disease.    HISTORY OF PRESENT ILLNESS:  45 year old male with PMH of abdominal hernia who was recently admitted 5/1 - 5/11 initially for complaints of back pain and 30 pound unintentional weight loss. CT abdomen was done in ED which suggested pancreatic cancer with metastasis to liver, spleen and both kidneys. Biopsy consistent with metastatic adenocarcinoma with most likely pancreaticobiliary primary. Workup also positive for bilateral PE and extensive DVT. He was started on heparin transfusion and eventually transitioned to Xarelto and discharged to home. He underwent his first chemotherapy treatment 5/14 (palliative only) which caused him to have profound nausea/vomiting and weakness. This progressed to include chest pain and SOB. He presented to [facility] ED with these complaints.  CTA was repeated and demonstrated worsening clot burden and suspected R heart strain. PCCM consulted for further evaluation.   PAST MEDICAL HISTORY :   has a past medical history of Abdominal hernia and Metastatic cancer ([place]) (10/28/2015).  has past surgical history that includes Abdominal surgery. Prior to Admission medications   Medication Sig Start Date End Date Taking? Authorizing Provider  lidocaine-prilocaine (EMLA) cream Apply to affected area once 11/08/15  Yes Tiffany Daneil Dan, PA-C  oxyCODONE 10 MG TABS Take 1 tablet (10 mg total) by mouth every 3 (three) hours as needed for moderate pain, severe pain or breakthrough pain. 11/07/15  Yes Nita Sells, MD  oxyCODONE 30 MG 12 hr tablet Take 30 mg by mouth every 12 (twelve) hours. 11/07/15  Yes Nita Sells, MD  prochlorperazine (COMPAZINE) 10 MG tablet Take 1 tablet (10 mg total) by mouth every 6 (six) hours as needed (NAUSEA). 11/08/15  Yes Heath Lark, MD  Rivaroxaban (XARELTO) 15 MG TABS tablet Take 1 tablet (15 mg total) by mouth 2 (two) times daily with a meal. 11/07/15  Yes Nita Sells, MD  senna-docusate (SENOKOT-S) 8.6-50 MG tablet Take 2 tablets by mouth 2 (two) times daily. 11/07/15  Yes Nita Sells, MD  sertraline (ZOLOFT) 50 MG tablet Take 2 tablets (100 mg total) by mouth daily. Patient taking differently: Take 50 mg by mouth 2 (two) times daily.  11/08/15  Yes Tiffany Daneil Dan, PA-C  sorbitol 70 % SOLN Take 40 mLs by mouth 2 (two) times daily. 11/07/15  Yes Nita Sells, MD  ALPRAZolam Duanne Moron) 0.5 MG tablet Take 1 tablet (0.5 mg total) by mouth 3 (three) times daily as needed for anxiety (try first dose now if patient thinks he needs it.  thank you). Patient not taking: Reported on 11/12/2015 11/07/15   Nita Sells, MD  ondansetron (ZOFRAN) 8 MG tablet Take 1 tablet (8 mg total) by mouth every 8 (eight) hours as needed for nausea, vomiting or refractory nausea / vomiting. 11/08/15   Brayton Caves,  PA-C  Rivaroxaban (XARELTO STARTER PACK) 15 & 20 MG TBPK Take as directed on package: Start with one 67m tablet by mouth twice a day with food. On Day 22, switch to one 246mtablet once a day with food. 11/07/15   JaNita SellsMD  rivaroxaban (XARELTO) KIT 1 kit by Does not apply route once. 11/07/15   JaNita SellsMD   No Known Allergies  FAMILY HISTORY:  family history is not on file. SOCIAL HISTORY:  reports that he quit smoking about 2 weeks ago. His smoking use included Cigarettes. He has a 3.75 pack-year smoking history. He has never used smokeless tobacco. He reports that he does not drink alcohol or use illicit drugs.  REVIEW OF SYSTEMS:   Bolds are positive  Constitutional: weight loss, gain, night sweats, Fevers, chills, fatigue .  HEENT: headaches, Sore throat, sneezing, nasal congestion, post nasal drip, Difficulty swallowing, Tooth/dental problems, visual complaints visual changes, ear ache CV:  chest pain, sharp: ,Orthopnea, PND, swelling in lower extremities, dizziness, palpitations, syncope.  GI  heartburn, indigestion, abdominal pain, nausea, vomiting, diarrhea, change in bowel habits, loss of appetite, bloody stools.  Resp: cough, productive: , hemoptysis, dyspnea, chest pain, pleuritic.  Skin: rash or itching or icterus GU: dysuria, change in color of urine, urgency or frequency. flank pain, hematuria  MS: joint pain or swelling. decreased range of motion  Psych: change in mood or affect. depression or anxiety.  Neuro: difficulty with speech, weakness, numbness, ataxia   SUBJECTIVE:   VITAL SIGNS: Temp:  [98.1 F (36.7 C)-98.8 F (37.1 C)] 98.8 F (37.1 C) (05/16 2139) Pulse Rate:  [117-128] 124 (05/17 0000) Resp:  [20-28] 25 (05/17 0000) BP: (110-142)/(57-104) 138/77 mmHg (05/17 0000) SpO2:  [94 %-98 %] 95 % (05/17 0000) Weight:  [82.3 kg (181 lb 7 oz)-87.091 kg (192 lb)] 82.3 kg (181 lb 7 oz) (05/17 0024)  PHYSICAL EXAMINATION: General:  Male  of normal body habitus in NAD Neuro:  Somnolent, but easily arouses. Oriented x 3 HEENT:  [place]/At, PERRL, no JVD Cardiovascular:  Tachy, regular, no MRG Lungs:  Clear bilateral breath sounds, unlabored with sats 95% on room air.  Abdomen:  Soft, non-distended Musculoskeletal:  No acute deformity or ROM limitation Skin:  Grossly intact   Recent Labs Lab 11/12/15 2011 11/13/15 0130  NA 131* 132*  K 4.2 4.4  CL 98* 100*  CO2 23 24  BUN 18 17  CREATININE 0.82 0.73  GLUCOSE 129* 121*    Recent Labs Lab 11/07/15 0647 11/12/15 2011 11/13/15 0130  HGB 11.4* 11.1* 10.2*  HCT 35.4* 33.3* 31.2*  WBC 14.3* 17.5* 14.8*  PLT 242 210 189   Dg Chest 2 View  11/12/2015  CLINICAL DATA:  Pancreatic cancer.  Chest pain. EXAM: CHEST  2 VIEW COMPARISON:  CT chest 10/30/2015 FINDINGS: There is a right-sided Port-A-Cath in satisfactory position. There is mild lingular atelectasis. The lungs are otherwise clear. There is no pleural effusion or pneumothorax. The heart and mediastinal contours are unremarkable. The osseous structures are unremarkable. IMPRESSION: No active cardiopulmonary disease. Electronically Signed   By: HeKathreen Devoid On: 11/12/2015 20:41   Ct Angio Chest Pe W/cm &/or Wo Cm  11/12/2015  ADDENDUM REPORT: 11/12/2015 23:02 ADDENDUM: Please note the study was compared to the chest CT dated 10/30/2015 Overall there has been interval progression of the scratch the pulmonary emboli on today's study compared to prior study. For example there is new pulmonary embolus in the right middle lobe segmental branch (series 4, image 44) not present on the prior study. Right upper lobe pulmonary artery embolus also appears more progressed compared the prior study. Evaluation of today's study is however somewhat limited due to respiratory motion artifact. These results were called by telephone at the time of interpretation on 11/12/2015 at 11:02 pm to Dr. Davonna Belling , who verbally acknowledged these  results. Electronically Signed   By: Anner Crete M.D.   On: 11/12/2015 23:02  11/12/2015  CLINICAL DATA:  Follow up 45 year old male with chest pain. History of cancer. Concern for PE. EXAM: CT ANGIOGRAPHY CHEST WITH CONTRAST TECHNIQUE: Multidetector CT imaging of the chest was performed using the standard protocol during bolus administration of intravenous contrast. Multiplanar CT image reconstructions and MIPs were obtained to evaluate the vascular anatomy. CONTRAST:  100 cc Isovue 370 COMPARISON:  Chest radiograph dated 11/12/2015 and chest CT dated 10/30/2006 FINDINGS: There are linear bibasilar atelectasis/ scarring. There is mild centrilobular emphysema. The lungs are clear. There is no pleural effusion or pneumothorax. The central airways are patent. The thoracic aorta and the origins of the great vessels of the aortic arch appear patent. Evaluation of this exam is limited due to respiratory motion artifact. There are filling defects within the segmental and subsegmental branches of the right middle and right lower lobes. Right upper lobe segmental and subsegmental filling defects are also noted. There are filling defects within the left upper and left lower lobe pulmonary artery vasculature extending from the segmental vessels distally. There is mild dilatation of the right ventricle with retrograde flow of contrast into the IVC concerning for a degree of right cardiac straining. Correlation with clinical exam and echocardiogram recommended. There is mild cardiomegaly. No pericardial effusion. Subcarinal lymph node measures 1.4 cm in short axis. No definite hilar adenopathy. The esophagus and the thyroid gland appear grossly unremarkable. There is no axillary adenopathy. Right pectoral Port-A-Cath with tip at the cavoatrial junction. The chest wall soft tissues appear unremarkable. The osseous structures are intact. There are multiple hepatic hypodense lesions measuring up to 4.2 x 4.1 cm in the dome  of the liver most compatible with metastatic disease. Review of the MIP images confirms the above findings. IMPRESSION: Extensive bilateral pulmonary artery emboli with findings concerning for a degree of right cardiac straining. Correlation with clinical exam and echocardiogram recommended. Mildly enlarged subcarinal lymph node measuring 1.4 cm short axis. Extensive hepatic metastatic disease. Electronically Signed: By: Anner Crete M.D. On: 11/12/2015 22:51    ASSESSMENT / PLAN:  Bilateral pulmonary embolism with suspected R heart strain Hypoxia secondary to above Recommendations - supplemental O2 titrate to SpO2 > 92% - heparin infusion per pharmacy dosing - may be a better candidate for warfarin rather than xarelto so INR can be measured to ensure therapeutic. - Echo - Good candidate for IVC filter to prevent worsening of PE as DVT obviously mobile.  - Not a candidate for thrombolytic therapy of any kind.   Georgann Housekeeper, AGACNP-BC [facility] Pulmonology/Critical Care Pager [phone] or [phone]  11/13/2015 3:48 AM

## 2015-11-13 NOTE — Progress Notes (Signed)
  Echocardiogram 2D Echocardiogram has been performed.  Darlina Sicilian M 11/13/2015, 1:43 PM

## 2015-11-14 DIAGNOSIS — F418 Other specified anxiety disorders: Secondary | ICD-10-CM

## 2015-11-14 DIAGNOSIS — I5189 Other ill-defined heart diseases: Secondary | ICD-10-CM

## 2015-11-14 LAB — HEPARIN LEVEL (UNFRACTIONATED)
HEPARIN UNFRACTIONATED: 0.72 [IU]/mL — AB (ref 0.30–0.70)
Heparin Unfractionated: 0.85 IU/mL — ABNORMAL HIGH (ref 0.30–0.70)

## 2015-11-14 LAB — CBC
HCT: 31.4 % — ABNORMAL LOW (ref 39.0–52.0)
Hemoglobin: 10.2 g/dL — ABNORMAL LOW (ref 13.0–17.0)
MCH: 26.8 pg (ref 26.0–34.0)
MCHC: 32.5 g/dL (ref 30.0–36.0)
MCV: 82.4 fL (ref 78.0–100.0)
PLATELETS: 214 10*3/uL (ref 150–400)
RBC: 3.81 MIL/uL — AB (ref 4.22–5.81)
RDW: 14.7 % (ref 11.5–15.5)
WBC: 12.4 10*3/uL — AB (ref 4.0–10.5)

## 2015-11-14 LAB — COMPREHENSIVE METABOLIC PANEL
ALBUMIN: 2.7 g/dL — AB (ref 3.5–5.0)
ALT: 46 U/L (ref 17–63)
ANION GAP: 7 (ref 5–15)
AST: 45 U/L — AB (ref 15–41)
Alkaline Phosphatase: 374 U/L — ABNORMAL HIGH (ref 38–126)
BILIRUBIN TOTAL: 1.8 mg/dL — AB (ref 0.3–1.2)
BUN: 15 mg/dL (ref 6–20)
CALCIUM: 9.3 mg/dL (ref 8.9–10.3)
CO2: 27 mmol/L (ref 22–32)
CREATININE: 0.85 mg/dL (ref 0.61–1.24)
Chloride: 99 mmol/L — ABNORMAL LOW (ref 101–111)
GFR calc Af Amer: >60 mL/min (ref >60)
GFR calc non Af Amer: >60 mL/min (ref >60)
GLUCOSE: 98 mg/dL (ref 65–99)
Potassium: 4.8 mmol/L (ref 3.5–5.1)
SODIUM: 133 mmol/L — AB (ref 135–145)
TOTAL PROTEIN: 6.9 g/dL (ref 6.5–8.1)

## 2015-11-14 LAB — MAGNESIUM: MAGNESIUM: 2.2 mg/dL (ref 1.7–2.4)

## 2015-11-14 LAB — GLUCOSE, CAPILLARY: Glucose-Capillary: 92 mg/dL (ref 65–99)

## 2015-11-14 LAB — APTT
aPTT: 93 seconds — ABNORMAL HIGH (ref 24–37)
aPTT: 88 seconds — ABNORMAL HIGH (ref 24–37)

## 2015-11-14 MED ORDER — SODIUM CHLORIDE 0.9 % IV SOLN
8.0000 mg | Freq: Four times a day (QID) | INTRAVENOUS | Status: DC | PRN
  Administered 2015-11-14 – 2015-11-15 (×2): 8 mg via INTRAVENOUS
  Filled 2015-11-14 (×5): qty 4

## 2015-11-14 MED ORDER — LORAZEPAM 2 MG/ML IJ SOLN
0.5000 mg | INTRAMUSCULAR | Status: DC | PRN
  Administered 2015-11-14 – 2015-11-16 (×4): 1 mg via INTRAVENOUS
  Filled 2015-11-14 (×4): qty 1

## 2015-11-14 MED ORDER — LORAZEPAM 1 MG PO TABS
1.0000 mg | ORAL_TABLET | Freq: Three times a day (TID) | ORAL | Status: DC | PRN
  Administered 2015-11-15: 1 mg via ORAL
  Filled 2015-11-14 (×2): qty 1

## 2015-11-14 NOTE — Progress Notes (Signed)
Nathan [name]   DOB:04-12-1971   [id number]    Subjective: He felt better. Pain is better controlled today. Chest pain and shortness of breath has improved. He continues to have poor oral intake and nausea. Denies constipation. He had IVC filter placed yesterday.The patient denies any recent signs or symptoms of bleeding such as spontaneous epistaxis, hematuria or hematochezia.  Objective:  Filed Vitals:   11/14/15 0600 11/14/15 0800  BP: 140/102 139/89  Pulse: 97 85  Temp:  98.1 F (36.7 C)  Resp: 19      Intake/Output Summary (Last 24 hours) at 11/14/15 0936 Last data filed at 11/14/15 0800  Gross per 24 hour  Intake 3245.5 ml  Output   2150 ml  Net 1095.5 ml    GENERAL:alert, no distress and comfortable SKIN: skin color, texture, turgor are normal, no rashes or significant lesions EYES: normal, Conjunctiva are pink and non-injected, sclera clear OROPHARYNX:no exudate, no erythema and lips, buccal mucosa, and tongue normal  NECK: supple, thyroid normal size, non-tender, without nodularity LYMPH:  no palpable lymphadenopathy in the cervical, axillary or inguinal LUNGS: clear to auscultation and percussion with normal breathing effort HEART: regular rate & rhythm and no murmurs and no lower extremity edema ABDOMEN:abdomen soft, Mild epigastric tenderness and normal bowel sounds Musculoskeletal:no cyanosis of digits and no clubbing  NEURO: alert & oriented x 3 with fluent speech, no focal motor/sensory deficits   Labs:  Lab Results  Component Value Date   WBC 12.4* 11/14/2015   HGB 10.2* 11/14/2015   HCT 31.4* 11/14/2015   MCV 82.4 11/14/2015   PLT 214 11/14/2015    Lab Results  Component Value Date   NA 133* 11/14/2015   K 4.8 11/14/2015   CL 99* 11/14/2015   CO2 27 11/14/2015    Studies:  Dg Chest 2 View  11/12/2015  CLINICAL DATA:  Pancreatic cancer.  Chest pain. EXAM: CHEST  2 VIEW COMPARISON:  CT chest 10/30/2015 FINDINGS: There is a right-sided  Port-A-Cath in satisfactory position. There is mild lingular atelectasis. The lungs are otherwise clear. There is no pleural effusion or pneumothorax. The heart and mediastinal contours are unremarkable. The osseous structures are unremarkable. IMPRESSION: No active cardiopulmonary disease. Electronically Signed   By: Kathreen Devoid   On: 11/12/2015 20:41   Ct Angio Chest Pe W/cm &/or Wo Cm  11/12/2015  ADDENDUM REPORT: 11/12/2015 23:02 ADDENDUM: Please note the study was compared to the chest CT dated 10/30/2015 Overall there has been interval progression of the scratch the pulmonary emboli on today's study compared to prior study. For example there is new pulmonary embolus in the right middle lobe segmental branch (series 4, image 44) not present on the prior study. Right upper lobe pulmonary artery embolus also appears more progressed compared the prior study. Evaluation of today's study is however somewhat limited due to respiratory motion artifact. These results were called by telephone at the time of interpretation on 11/12/2015 at 11:02 pm to Dr. Davonna Belling , who verbally acknowledged these results. Electronically Signed   By: Anner Crete M.D.   On: 11/12/2015 23:02  11/12/2015  CLINICAL DATA:  Follow up 45 year old male with chest pain. History of cancer. Concern for PE. EXAM: CT ANGIOGRAPHY CHEST WITH CONTRAST TECHNIQUE: Multidetector CT imaging of the chest was performed using the standard protocol during bolus administration of intravenous contrast. Multiplanar CT image reconstructions and MIPs were obtained to evaluate the vascular anatomy. CONTRAST:  100 cc Isovue 370 COMPARISON:  Chest radiograph  dated 11/12/2015 and chest CT dated 10/30/2006 FINDINGS: There are linear bibasilar atelectasis/ scarring. There is mild centrilobular emphysema. The lungs are clear. There is no pleural effusion or pneumothorax. The central airways are patent. The thoracic aorta and the origins of the great  vessels of the aortic arch appear patent. Evaluation of this exam is limited due to respiratory motion artifact. There are filling defects within the segmental and subsegmental branches of the right middle and right lower lobes. Right upper lobe segmental and subsegmental filling defects are also noted. There are filling defects within the left upper and left lower lobe pulmonary artery vasculature extending from the segmental vessels distally. There is mild dilatation of the right ventricle with retrograde flow of contrast into the IVC concerning for a degree of right cardiac straining. Correlation with clinical exam and echocardiogram recommended. There is mild cardiomegaly. No pericardial effusion. Subcarinal lymph node measures 1.4 cm in short axis. No definite hilar adenopathy. The esophagus and the thyroid gland appear grossly unremarkable. There is no axillary adenopathy. Right pectoral Port-A-Cath with tip at the cavoatrial junction. The chest wall soft tissues appear unremarkable. The osseous structures are intact. There are multiple hepatic hypodense lesions measuring up to 4.2 x 4.1 cm in the dome of the liver most compatible with metastatic disease. Review of the MIP images confirms the above findings. IMPRESSION: Extensive bilateral pulmonary artery emboli with findings concerning for a degree of right cardiac straining. Correlation with clinical exam and echocardiogram recommended. Mildly enlarged subcarinal lymph node measuring 1.4 cm short axis. Extensive hepatic metastatic disease. Electronically Signed: By: Anner Crete M.D. On: 11/12/2015 22:51   Ir Ivc Filter Plmt / S&i /img Guid/mod Sed  11/13/2015  CLINICAL DATA:  Stage IV metastatic pancreatic carcinoma with recent hospitalization for pulmonary embolism and DVT. Progressive bilateral pulmonary emboli despite anticoagulation with persistent residual thrombus in both lower extremity calf veins. The patient requires IVC filter placement.  EXAM: 1. ULTRASOUND GUIDANCE FOR VASCULAR ACCESS OF THE RIGHT INTERNAL JUGULAR VEIN. 2. IVC VENOGRAM. 3. PERCUTANEOUS IVC FILTER PLACEMENT. ANESTHESIA/SEDATION: 2.0 mg IV Versed; 100 mcg IV Fentanyl. Total Moderate Sedation Time 18 minutes. The patient's level of consciousness and physiologic status were continuously monitored during the procedure by Radiology nursing. CONTRAST:  38mL ISOVUE-300 IOPAMIDOL (ISOVUE-300) INJECTION 61%, 63mL ISOVUE-300 IOPAMIDOL (ISOVUE-300) INJECTION 61% FLUOROSCOPY TIME:  1 minutes and 42 seconds. PROCEDURE: The procedure, risks, benefits, and alternatives were explained to the patient. Questions regarding the procedure were encouraged and answered. The patient understands and consents to the procedure. A time-out was performed prior to initiating the procedure. The right neck was prepped with chlorhexidine in a sterile fashion, and a sterile drape was applied covering the operative field. A sterile gown and sterile gloves were used for the procedure. Local anesthesia was provided with 1% Lidocaine. Ultrasound was used to confirm patency of the right internal jugular vein. Under direct ultrasound guidance, a 21 gauge needle was advanced into the right internal jugular vein with ultrasound image documentation performed. After securing access with a micropuncture dilator, a guidewire was advanced into the inferior vena cava. A deployment sheath was advanced over the guidewire. This was utilized to perform IVC venography. The deployment sheath was further positioned in an appropriate location for filter deployment. A Bard Denali IVC filter was then advanced in the sheath. This was then fully deployed in the infrarenal IVC. Final filter position was confirmed with a fluoroscopic spot image. After the procedure the sheath was removed and hemostasis obtained  with manual compression. COMPLICATIONS: None. FINDINGS: IVC venography demonstrates a normal caliber IVC with no evidence of thrombus.  Renal veins are identified bilaterally. The IVC filter was successfully positioned below the level of the renal veins and is appropriately oriented. This IVC filter has both permanent and retrievable indications. IMPRESSION: Placement of percutaneous IVC filter in infrarenal IVC. IVC venogram shows no evidence of IVC thrombus and normal caliber of the inferior vena cava. This filter does have both permanent and retrievable indications. Due to patient related comorbidities, history of metastatic malignancy and clinical necessity, this IVC filter should be considered a permanent device. This patient will not be actively followed for future filter retrieval. Electronically Signed   By: Aletta Edouard M.D.   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 Chemotherapy was discontinued yesterday. He missed his dose of Neulasta on 11/13/2015. I will start him on G-CSF daily starting 11/14/2015 to prevent risk of neutropenic fever in the near future  Acute, worsening pulmonary embolism (HCC) with possible heart strain Possible ventricular mass on echocardiogram He has extensive DVT and PE and was on Xarelto. He is now admitted with evidence of new blood clots He is currently on IV heparin. He had IVC filter placed on 11/13/2015. I discussed extensively with pulmonologist. We can consider switching him back to Xarelto in the future but will get input from cardiologist regarding possible ventricular mass seen on echocardiogram, and to decide whether a TEE is needed  Severe nausea and dehydration Could be related to side effects of treatment, his cancer and side effects from chemotherapy. Continue anti-emetics and IV fluid support  Cancer associated pain and chest pain from recent PE He is currently taking combination  therapy with extended release pain medicine and immediate release oxycodone for breakthrough pain medicine. Continue the same.  Constipation due to opioid therapy, resolved Continue aggressive regimen with laxatives  Protein-calorie malnutrition, moderate ([place]) He has lost a lot of weight with poor oral intake due to cancer diagnosis. I reinforced the importance of frequent small meals.  Encounter for palliative care The patient is aware he has stage IV disease and treatment is strictly palliative. We discussed importance of Advanced Directives and Living will. We discussed CODE STATUS in the past; the patient desires to remain in full code. He has referral set up for palliative care service to follow as an outpatient  Discharge planning Not ready for discharge due to cardiovascular instability. I will continue to follow while he is hospitalized [facility], [place], MD 11/14/2015  9:36 AM

## 2015-11-14 NOTE — Progress Notes (Signed)
ANTICOAGULATION CONSULT NOTE - BRIEF NOTE (Heparin level/aPTT follow-up)  Pharmacy Consult for Heparin Indication: pulmonary embolus and DVT  Assessment: 53 yoM with history of metastatic pancreatic cancer, currently receiving chemotherapy, with recent PE/DVT noted on 10/28/2015 admit to [facility]. Patient was started on IV heparin infusion during that admission (held briefly for procedures - liver biopsy and PAC placement) and then transitioned to Xarelto on 5/9 prior to discharge on 5/11. Patient presents to [facility] ED on 5/16 with worsening SOB and CP, now has progression of bilateral extensive PE with possible right heart strain. Patient was taking rivaroxaban 15mg  po bid PTA with last dose noted 5/16 at 1700 on med history. Patient states that he has been compliant with his medications. IR placed IVC filter 5/17.   11/13/2015: - aPTT therapeutic at 86 seconds with infusion at 1700 units/hr follow rate reduce for elevated aPTT earlier today. Heparin level elevated but trending down, suspect related to xarelto   Noted that during previous admission, patient was obtaining therapeutic heparin levels with heparin infusion at 2300 units/hr. - SCr WNL, CrCl>100 ml/min so would anticipate quick elimination of Xarelto - IVC filter placed. She recommends transitioning to Lovenox once stable. - RN notes heparin not turned off during IVC filter - No bleeding/complications reported.  11/14/2015: -0000aPtt=93 and HL=0.85, no problems with infusion or bleeding per RN  Goal of Therapy:  Heparin level 0.3-0.7 units/ml aPTT 66-102 seconds Monitor platelets by anticoagulation protocol: Yes  Plan:   Continue heparin at current rate of 1700 units/hr based on therapeutic aPTT  Recheck aPTT and heparin level 5/18 10 am   Dorrene German 11/14/2015 1:31 AM

## 2015-11-14 NOTE — Progress Notes (Signed)
Name: Nathan [name] MRN: [id number] DOB: 1971/01/12    ADMISSION DATE:  11/12/2015 CONSULTATION DATE:  11/13/2015  REFERRING MD :  EDP  CHIEF COMPLAINT:  SOB  BRIEF PATIENT DESCRIPTION: 45 year old male with recent dx of stage IV pancreatic cancer and subsequent pulmonary embolism with extenisve DVT during admission 10/28/2015. He was started on xarelto, however presented 5/16 with worsening SOB and chest pain. Found to have worsening PE & PCCM consulted for evaluation.    SUBJECTIVE: Underwent IVC filter placement 5/17 pm.  Pt reports nausea, vomiting.  Reports SOB improved.  Feels anxious / depressed.   VITAL SIGNS: Temp:  [98.1 F (36.7 C)-99.3 F (37.4 C)] 98.1 F (36.7 C) (05/18 0800) Pulse Rate:  [93-113] 97 (05/18 0600) Resp:  [16-26] 19 (05/18 0600) BP: (133-147)/(76-103) 140/102 mmHg (05/18 0600) SpO2:  [93 %-100 %] 97 % (05/18 0600)  PHYSICAL EXAMINATION: General:  Male of normal body habitus in NAD Neuro:  AAOx4, speech clear, MAE.  HEENT:  [place], PERRL, no JVD Cardiovascular:  s1s2 rrr, no MRG Lungs:  Clear bilateral breath sounds, unlabored with sats 95% on room air.  Abdomen:  Soft, non-distended Musculoskeletal:  No acute deformity or ROM limitation Skin:  Grossly intact   Recent Labs Lab 11/13/15 0130 11/13/15 0816 11/14/15 0435  NA 132* 134* 133*  K 4.4 4.6 4.8  CL 100* 101 99*  CO2 24 24 27   BUN 17 15 15   CREATININE 0.73 0.74 0.85  GLUCOSE 121* 110* 98    Recent Labs Lab 11/12/15 2011 11/13/15 0130 11/14/15 0435  HGB 11.1* 10.2* 10.2*  HCT 33.3* 31.2* 31.4*  WBC 17.5* 14.8* 12.4*  PLT 210 189 214   Dg Chest 2 View  11/12/2015  CLINICAL DATA:  Pancreatic cancer.  Chest pain. EXAM: CHEST  2 VIEW COMPARISON:  CT chest 10/30/2015 FINDINGS: There is a right-sided Port-A-Cath in satisfactory position. There is mild lingular atelectasis. The lungs are otherwise clear. There is no pleural effusion or pneumothorax. The heart and mediastinal  contours are unremarkable. The osseous structures are unremarkable. IMPRESSION: No active cardiopulmonary disease. Electronically Signed   By: Kathreen Devoid   On: 11/12/2015 20:41   Ct Angio Chest Pe W/cm &/or Wo Cm  11/12/2015  ADDENDUM REPORT: 11/12/2015 23:02 ADDENDUM: Please note the study was compared to the chest CT dated 10/30/2015 Overall there has been interval progression of the scratch the pulmonary emboli on today's study compared to prior study. For example there is new pulmonary embolus in the right middle lobe segmental branch (series 4, image 44) not present on the prior study. Right upper lobe pulmonary artery embolus also appears more progressed compared the prior study. Evaluation of today's study is however somewhat limited due to respiratory motion artifact. These results were called by telephone at the time of interpretation on 11/12/2015 at 11:02 pm to Dr. Davonna Belling , who verbally acknowledged these results. Electronically Signed   By: Anner Crete M.D.   On: 11/12/2015 23:02  11/12/2015  CLINICAL DATA:  Follow up 45 year old male with chest pain. History of cancer. Concern for PE. EXAM: CT ANGIOGRAPHY CHEST WITH CONTRAST TECHNIQUE: Multidetector CT imaging of the chest was performed using the standard protocol during bolus administration of intravenous contrast. Multiplanar CT image reconstructions and MIPs were obtained to evaluate the vascular anatomy. CONTRAST:  100 cc Isovue 370 COMPARISON:  Chest radiograph dated 11/12/2015 and chest CT dated 10/30/2006 FINDINGS: There are linear bibasilar atelectasis/ scarring. There is mild centrilobular  emphysema. The lungs are clear. There is no pleural effusion or pneumothorax. The central airways are patent. The thoracic aorta and the origins of the great vessels of the aortic arch appear patent. Evaluation of this exam is limited due to respiratory motion artifact. There are filling defects within the segmental and subsegmental  branches of the right middle and right lower lobes. Right upper lobe segmental and subsegmental filling defects are also noted. There are filling defects within the left upper and left lower lobe pulmonary artery vasculature extending from the segmental vessels distally. There is mild dilatation of the right ventricle with retrograde flow of contrast into the IVC concerning for a degree of right cardiac straining. Correlation with clinical exam and echocardiogram recommended. There is mild cardiomegaly. No pericardial effusion. Subcarinal lymph node measures 1.4 cm in short axis. No definite hilar adenopathy. The esophagus and the thyroid gland appear grossly unremarkable. There is no axillary adenopathy. Right pectoral Port-A-Cath with tip at the cavoatrial junction. The chest wall soft tissues appear unremarkable. The osseous structures are intact. There are multiple hepatic hypodense lesions measuring up to 4.2 x 4.1 cm in the dome of the liver most compatible with metastatic disease. Review of the MIP images confirms the above findings. IMPRESSION: Extensive bilateral pulmonary artery emboli with findings concerning for a degree of right cardiac straining. Correlation with clinical exam and echocardiogram recommended. Mildly enlarged subcarinal lymph node measuring 1.4 cm short axis. Extensive hepatic metastatic disease. Electronically Signed: By: Anner Crete M.D. On: 11/12/2015 22:51   Ir Ivc Filter Plmt / S&i /img Guid/mod Sed  11/13/2015  CLINICAL DATA:  Stage IV metastatic pancreatic carcinoma with recent hospitalization for pulmonary embolism and DVT. Progressive bilateral pulmonary emboli despite anticoagulation with persistent residual thrombus in both lower extremity calf veins. The patient requires IVC filter placement. EXAM: 1. ULTRASOUND GUIDANCE FOR VASCULAR ACCESS OF THE RIGHT INTERNAL JUGULAR VEIN. 2. IVC VENOGRAM. 3. PERCUTANEOUS IVC FILTER PLACEMENT. ANESTHESIA/SEDATION: 2.0 mg IV Versed;  100 mcg IV Fentanyl. Total Moderate Sedation Time 18 minutes. The patient's level of consciousness and physiologic status were continuously monitored during the procedure by Radiology nursing. CONTRAST:  59mL ISOVUE-300 IOPAMIDOL (ISOVUE-300) INJECTION 61%, 43mL ISOVUE-300 IOPAMIDOL (ISOVUE-300) INJECTION 61% FLUOROSCOPY TIME:  1 minutes and 42 seconds. PROCEDURE: The procedure, risks, benefits, and alternatives were explained to the patient. Questions regarding the procedure were encouraged and answered. The patient understands and consents to the procedure. A time-out was performed prior to initiating the procedure. The right neck was prepped with chlorhexidine in a sterile fashion, and a sterile drape was applied covering the operative field. A sterile gown and sterile gloves were used for the procedure. Local anesthesia was provided with 1% Lidocaine. Ultrasound was used to confirm patency of the right internal jugular vein. Under direct ultrasound guidance, a 21 gauge needle was advanced into the right internal jugular vein with ultrasound image documentation performed. After securing access with a micropuncture dilator, a guidewire was advanced into the inferior vena cava. A deployment sheath was advanced over the guidewire. This was utilized to perform IVC venography. The deployment sheath was further positioned in an appropriate location for filter deployment. A Bard Denali IVC filter was then advanced in the sheath. This was then fully deployed in the infrarenal IVC. Final filter position was confirmed with a fluoroscopic spot image. After the procedure the sheath was removed and hemostasis obtained with manual compression. COMPLICATIONS: None. FINDINGS: IVC venography demonstrates a normal caliber IVC with no evidence of thrombus.  Renal veins are identified bilaterally. The IVC filter was successfully positioned below the level of the renal veins and is appropriately oriented. This IVC filter has both  permanent and retrievable indications. IMPRESSION: Placement of percutaneous IVC filter in infrarenal IVC. IVC venogram shows no evidence of IVC thrombus and normal caliber of the inferior vena cava. This filter does have both permanent and retrievable indications. Due to patient related comorbidities, history of metastatic malignancy and clinical necessity, this IVC filter should be considered a permanent device. This patient will not be actively followed for future filter retrieval. Electronically Signed   By: Aletta Edouard M.D.   On: 11/13/2015 17:28   SIGNIFICANT EVENTS  5/1-5/11 admit, diagnosed with stage IV pancreatic Ca and PE 5/16  Admit with worsening PE 5/17  Placement of IVC filter  STUDIES:  LE doppler 5/03 > Findings consistent with acute deep vein thrombosis involving the right peroneal vein, left posterial tibial vein, and leftperoneal vein. Acute superficial vein thrombosis involving the left small saphenous vein. CTA chest 5/16 > Extensive bilateral pulmonary artery emboli with findings concerning for a degree of right cardiac straining. Clot burden worse since prior study. Correlation with clinical exam and echocardiogram recommended. Mildly enlarged subcarinal lymph node measuring 1.4 cm short axis. Extensive hepatic metastatic disease.  ECHO 5/17 > LVEF 60-65%, normal wall motion, RV systolic function mildly reduced, 1.7 cm, pedunculated, mobile mass on the lateral wall of RV, concern for RV wall metastasis of pancreatic cancer  ASSESSMENT / PLAN:  Bilateral pulmonary embolism with suspected R heart strain - s/p IVC filter placement 5/18 Hypoxia secondary to above  Recommendations: - supplemental O2 titrate to SpO2 > 92% - pulmonary hygiene - IS, mobilize as able - heparin infusion per pharmacy dosing   - will continue heparin gtt for now, likely can transition back to xarelto for anticoagulation.  Doubt failure of xarelto. - ECHO as above > discussed with Dr. Marlou Porch, no  role for TEE - Appreciate IR assistance for placement of IVC filter    Depression / Anxiety Nausea / Vomiting    Recommendations: PRN Zofran for nausea  Discussed use of dronabinol with pharmacy, may exacerbate depression Continue zoloft   Ativan 1mg  TID PRN for anxiety  PRN IV ativan in the event he can not take [place], NP-C [facility] Pgr: [phone] or if no answer [phone] 11/14/2015, 8:11 AM

## 2015-11-14 NOTE — Progress Notes (Signed)
ANTICOAGULATION CONSULT NOTE - Follow Up Consult  Pharmacy Consult for Heparin Indication: pulmonary embolus and DVT  No Known Allergies  Patient Measurements: Height: 5\' 9"  (175.3 cm) Weight: 181 lb 7 oz (82.3 kg) IBW/kg (Calculated) : 70.7 Heparin Dosing Weight: using total body weight  Vital Signs: Temp: 98.1 F (36.7 C) (05/18 0800) Temp Source: Oral (05/18 0800) BP: 138/98 mmHg (05/18 1000) Pulse Rate: 96 (05/18 1000)  Labs:  Recent Labs  11/12/15 2011 11/13/15 0130  11/13/15 0816 11/13/15 1213 11/13/15 1350 11/13/15 1535 11/14/15 11/14/15 0435 11/14/15 0950  HGB 11.1* 10.2*  --   --   --   --   --   --  10.2*  --   HCT 33.3* 31.2*  --   --   --   --   --   --  31.4*  --   PLT 210 189  --   --   --   --   --   --  214  --   APTT  --   --   --  157*  --   --  86* 93*  --   --   LABPROT 27.4*  --   --   --  19.0*  --   --   --   --   --   INR 2.59*  --   --   --  1.59*  --   --   --   --   --   HEPARINUNFRC  --   --   < > 1.20*  --   --  0.86* 0.85*  --  0.72*  CREATININE 0.82 0.73  --  0.74  --   --   --   --  0.85  --   TROPONINI 0.03  --   --  0.03  --  0.03  --   --   --   --   < > = values in this interval not displayed.  Estimated Creatinine Clearance: 112.1 mL/min (by C-G formula based on Cr of 0.85).   Assessment: 52 yoM with history of metastatic pancreatic cancer, currently receiving chemotherapy, with recent PE/DVT noted on 10/28/2015 admit to [facility]. Patient was started on IV heparin infusion during that admission (held briefly for procedures - liver biopsy and PAC placement) and then transitioned to Xarelto on 5/9 prior to discharge on 5/11.  Patient presents to [facility] ED on 5/16 with worsening SOB and CP, now has progression of bilateral extensive PE with possible right heart strain.  Patient was taking rivaroxaban 15mg  po bid PTA with last dose noted 5/16 at 1700 on med history. Patient states that he has been compliant with his medications.  MD notes that  worsening pulmonary emboli may be secondary to mobile DVT.  PCCM notes indicate doubt Xarelto failure.  Significant events: 5/17 IR placed IVC filter.  ECHO showing mobile mass in RV.  Today, 11/14/2015: - aPTT 88 seconds. Remaining therapeutic with infusion at 1700 units/hr.   - Heparin level elevated but trending down, likely due to lingering effect of Xarelto. - SCr WNL, CrCl>100 ml/min so would anticipate quick elimination of Xarelto - No bleeding reported.  - Likely plan to transition back to Xarelto.  No role for TEE per cardiology.  No TPA given high likelihood of brain mets.  No surgical resection, too risky per PCCM.  Goal of Therapy:  Heparin level 0.3-0.7 units/ml aPTT 66-102 seconds Monitor platelets by anticoagulation protocol: Yes   Plan:  Continue heparin infusion at 1700 units/hr. Check aPTT and heparin level in AM. F/u plan for possible transition to Xarelto.  Hershal Coria 11/14/2015,10:28 AM

## 2015-11-14 NOTE — Progress Notes (Signed)
PROGRESS NOTE    Nathan [name]  [id number] DOB: Jul 05, 1970 DOA: 11/12/2015 PCP: Almond Lint, MD   Brief Narrative:  Nathan [name] is a 45 y.o. 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  Pt reports that he received his first treatment with chemotherapy in cancer center 11/12/2015 . He experienced mild hypersensitivity reaction. He had confusion, nausea, vomiting and sweating. He was treated with benadryl, Pepcid, and Solu-Medrol. All symptoms resolved and patient was able to complete all of his chemotherapy with no further issue per Dr. Calton Dach note. Later on, he developed chest pain and SOB. His chest pain is located in the front chest, radiating to the back and epigastric area. It is constant, 8 out of 10 in severity, sharp pain. Patient does not have fever or chills, no cough. He also has abdominal pain, which is at his baseline. He reported that he had 3 bowel movement with loose stool on the day of admission. Patient did not have fever, chills, symptoms of UTI or unilateral weakness.  ED Course: pt was found to have lactate 1.34, troponin negative, lipase 97, WBC 17.5, temperature normal, oxygen saturation 94% on room air, tachycardia, renal function normal, negative chest x-ray. CT angiogram showed worsening extensive bilateral PE with evidence of right heart straining. Pt is admitted to [facility] as inpt. PCCM, Dr. Lavina Hamman was consulted by EDP.   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 Patient with history of stage IV pancreatic cancer with metastases to the liver on chemotherapy, history of recent bilateral PE and bilateral DVT on Xarelto. Patient states has been compliant with all medications. Worsening pulmonary emboli may be secondary to mobile DVT. Remain in the stepdown unit. Continue IV heparin. 2-D echo with EF of 60-65% with no wall motion abnormalities. Right ventricular systolic function mildly reduced and a medium-sized, 1.7 cm x 0.6 cm pedunculated mobile mass on the lateral wall concerning for right ventricular wall metastases of pancreatic cancer.  Lower extremity Dopplers with no significant change since prior study of 10/30/2015. Continue O2 to keep sats greater than 92%. Patient status post IVC filter per interventional radiology 11/13/2015. Continue IV heparin and in discussion with PCCM and oncology will likely transition to oral Xarelto twice a day dosing. PCCM and oncology following and appreciate input and recommendations.   #2 tachycardia Likely secondary to problem #1. Improving.  #3 stage IV pancreatic cancer with metastases to the liver Patient received first chemotherapy yesterday as palliative treatment and had a mild hypersensitivity reactions which has since resolved. Patient to have chemotherapy discontinued 11/13/2015.  Dr. Alvy Bimler of oncology following.   #4 bilateral DVT(acute right DVT in the peroneal vein/acute left DVT in posterior tibial and peroneal veins) Repeat lower extremity Dopplers with no  significant change since prior study of 10/30/2015. IV heparin. The pulmonary may transitioned to oral Xarelto.  #5 1.7 cm x 0.6 cm  pedunculated mobile mass in lateral wall of right ventricle concerning for metastases of pancreatic cancer Dr. Lake Bells, PCCM discussed 2-D echo findings with Dr. Marlou Porch of cardiology who feels 2-D echo findings and characteristics and location consistent with metastases of pancreatic cancer. No further cardiac workup needed at this time. Patient will likely need systemic chemotherapy and continuation of anticoagulation.  #6 nausea vomiting diarrhea abdominal pain Abdominal pain likely due to pancreatic cancer. Patient with a episode of nausea and emesis last night. No further diarrhea. Senokot-S and sorbitol on hold. Resume Senokot-S today. IV fluids. Pain management. Supportive care. Patient on a regular diet. Monitor.   #7 moderate protein calorie malnutrition Nutritional supplementation.  #8 depression/anxiety Continue Zoloft. Ativan as needed.  #9 leukocytosis Likely secondary to problem #1. CT chest negative for infiltrate. Patient also received a dose of IV Solu-Medrol prior to admission while receiving his chemotherapy due to hypersensitivity reaction. Patient with no urinary symptoms. WBC trending down. Monitor for now.   DVT prophylaxis: On IV heparin. Code Status: Full Family Communication: Updated patient. No family at bedside.  Disposition Plan: Remain in SDU.   Consultants:   PCCM: Georgann Housekeeper, NP  11/13/2015  Oncology/hematology: Dr. Alvy Bimler 11/13/2015  Procedures:   CT angiogram chest 11/12/2015  Chest x-ray 11/12/2015  2-D echo 11/13/2015  Lower extremity Dopplers 11/13/2015  IVC venogram and IVC filter placement 11/13/2015. Per Dr. Kathlene Cote.  Antimicrobials:   None   Subjective: Patient states chest pain has improved. Patient states some improvement with shortness of breath. Patient with emesis last night after eating is pizza. No nausea this morning. No emesis.   Objective: Filed Vitals:   11/14/15 0400 11/14/15 0442 11/14/15 0600 11/14/15 0800    BP: 143/98  140/102 139/89  Pulse: 98  97 85  Temp:  98.3 F (36.8 C)  98.1 F (36.7 C)  TempSrc:  Oral  Oral  Resp:   19   Height:      Weight:      SpO2: 96%  97% 99%    Intake/Output Summary (Last 24 hours) at 11/14/15 0844 Last data filed at 11/14/15 0800  Gross per 24 hour  Intake 3390.5 ml  Output   2150 ml  Net 1240.5 ml   Filed Weights   11/12/15 1956 11/13/15 0024  Weight: 87.091 kg (192 lb) 82.3 kg (181 lb 7 oz)    Examination:  General exam: Appears calm and comfortable. Sitting up in chair. Respiratory system: Clear to auscultation. Respiratory effort normal. Cardiovascular system: Tachycardic. No JVD, murmurs, rubs, gallops or clicks. No pedal edema. Gastrointestinal system: Abdomen is nondistended, soft and nontender. No organomegaly or masses felt. Normal bowel sounds heard.  Central nervous system: Alert and oriented. No focal neurological deficits. Extremities: Symmetric 5 x 5 power. Skin: No rashes, lesions or ulcers Psychiatry: Judgement and insight appear normal. Mood & affect appropriate.     Data Reviewed: I have personally reviewed following labs and imaging studies  CBC:  Recent Labs Lab 11/12/15 2011 11/13/15 0130 11/14/15 0435  WBC 17.5* 14.8* 12.4*  HGB 11.1* 10.2* 10.2*  HCT 33.3* 31.2* 31.4*  MCV 80.4 81.7 82.4  PLT 210 189 [id number]   Basic Metabolic Panel:  Recent Labs Lab 11/12/15 2011 11/13/15 0130 11/13/15 0816 11/14/15 0435  NA 131* 132* 134* 133*  K 4.2 4.4 4.6 4.8  CL 98* 100*  101 99*  CO2 23 24 24 27   GLUCOSE 129* 121* 110* 98  BUN 18 17 15 15   CREATININE 0.82 0.73 0.74 0.85  CALCIUM 9.8 9.2 9.4 9.3  MG  --   --   --  2.2   GFR: Estimated Creatinine Clearance: 112.1 mL/min (by C-G formula based on Cr of 0.85). Liver Function Tests:  Recent Labs Lab 11/12/15 2011 11/13/15 0130 11/14/15 0435  AST 42* 35 45*  ALT 44 39 46  ALKPHOS 399* 367* 374*  BILITOT 1.5* 1.7* 1.8*  PROT 7.7 7.2 6.9  ALBUMIN 2.9*  2.8* 2.7*    Recent Labs Lab 11/12/15 2011  LIPASE 79*   No results for input(s): AMMONIA in the last 168 hours. Coagulation Profile:  Recent Labs Lab 11/12/15 2011 11/13/15 1213  INR 2.59* 1.59*   Cardiac Enzymes:  Recent Labs Lab 11/12/15 2011 11/13/15 0816 11/13/15 1350  TROPONINI 0.03 0.03 0.03   BNP (last 3 results) No results for input(s): PROBNP in the last 8760 hours. HbA1C: No results for input(s): HGBA1C in the last 72 hours. CBG:  Recent Labs Lab 11/13/15 0734 11/14/15 0743  GLUCAP 97 92   Lipid Profile: No results for input(s): CHOL, HDL, LDLCALC, TRIG, CHOLHDL, LDLDIRECT in the last 72 hours. Thyroid Function Tests: No results for input(s): TSH, T4TOTAL, FREET4, T3FREE, THYROIDAB in the last 72 hours. Anemia Panel: No results for input(s): VITAMINB12, FOLATE, FERRITIN, TIBC, IRON, RETICCTPCT in the last 72 hours. Sepsis Labs:  Recent Labs Lab 11/12/15 2109  LATICACIDVEN 1.34    Recent Results (from the past 240 hour(s))  MRSA PCR Screening     Status: None   Collection Time: 11/13/15 12:25 AM  Result Value Ref Range Status   MRSA by PCR NEGATIVE NEGATIVE Final    Comment:        The GeneXpert MRSA Assay (FDA approved for NASAL specimens only), is one component of a comprehensive MRSA colonization surveillance program. It is not intended to diagnose MRSA infection nor to guide or monitor treatment for MRSA infections.          Radiology Studies: Dg Chest 2 View  11/12/2015  CLINICAL DATA:  Pancreatic cancer.  Chest pain. EXAM: CHEST  2 VIEW COMPARISON:  CT chest 10/30/2015 FINDINGS: There is a right-sided Port-A-Cath in satisfactory position. There is mild lingular atelectasis. The lungs are otherwise clear. There is no pleural effusion or pneumothorax. The heart and mediastinal contours are unremarkable. The osseous structures are unremarkable. IMPRESSION: No active cardiopulmonary disease. Electronically Signed   By: Kathreen Devoid   On: 11/12/2015 20:41   Ct Angio Chest Pe W/cm &/or Wo Cm  11/12/2015  ADDENDUM REPORT: 11/12/2015 23:02 ADDENDUM: Please note the study was compared to the chest CT dated 10/30/2015 Overall there has been interval progression of the scratch the pulmonary emboli on today's study compared to prior study. For example there is new pulmonary embolus in the right middle lobe segmental branch (series 4, image 44) not present on the prior study. Right upper lobe pulmonary artery embolus also appears more progressed compared the prior study. Evaluation of today's study is however somewhat limited due to respiratory motion artifact. These results were called by telephone at the time of interpretation on 11/12/2015 at 11:02 pm to Dr. Davonna Belling , who verbally acknowledged these results. Electronically Signed   By: Anner Crete M.D.   On: 11/12/2015 23:02  11/12/2015  CLINICAL DATA:  Follow up 45 year old male with chest pain. History  of cancer. Concern for PE. EXAM: CT ANGIOGRAPHY CHEST WITH CONTRAST TECHNIQUE: Multidetector CT imaging of the chest was performed using the standard protocol during bolus administration of intravenous contrast. Multiplanar CT image reconstructions and MIPs were obtained to evaluate the vascular anatomy. CONTRAST:  100 cc Isovue 370 COMPARISON:  Chest radiograph dated 11/12/2015 and chest CT dated 10/30/2006 FINDINGS: There are linear bibasilar atelectasis/ scarring. There is mild centrilobular emphysema. The lungs are clear. There is no pleural effusion or pneumothorax. The central airways are patent. The thoracic aorta and the origins of the great vessels of the aortic arch appear patent. Evaluation of this exam is limited due to respiratory motion artifact. There are filling defects within the segmental and subsegmental branches of the right middle and right lower lobes. Right upper lobe segmental and subsegmental filling defects are also noted. There are filling defects  within the left upper and left lower lobe pulmonary artery vasculature extending from the segmental vessels distally. There is mild dilatation of the right ventricle with retrograde flow of contrast into the IVC concerning for a degree of right cardiac straining. Correlation with clinical exam and echocardiogram recommended. There is mild cardiomegaly. No pericardial effusion. Subcarinal lymph node measures 1.4 cm in short axis. No definite hilar adenopathy. The esophagus and the thyroid gland appear grossly unremarkable. There is no axillary adenopathy. Right pectoral Port-A-Cath with tip at the cavoatrial junction. The chest wall soft tissues appear unremarkable. The osseous structures are intact. There are multiple hepatic hypodense lesions measuring up to 4.2 x 4.1 cm in the dome of the liver most compatible with metastatic disease. Review of the MIP images confirms the above findings. IMPRESSION: Extensive bilateral pulmonary artery emboli with findings concerning for a degree of right cardiac straining. Correlation with clinical exam and echocardiogram recommended. Mildly enlarged subcarinal lymph node measuring 1.4 cm short axis. Extensive hepatic metastatic disease. Electronically Signed: By: Anner Crete M.D. On: 11/12/2015 22:51   Ir Ivc Filter Plmt / S&i /img Guid/mod Sed  11/13/2015  CLINICAL DATA:  Stage IV metastatic pancreatic carcinoma with recent hospitalization for pulmonary embolism and DVT. Progressive bilateral pulmonary emboli despite anticoagulation with persistent residual thrombus in both lower extremity calf veins. The patient requires IVC filter placement. EXAM: 1. ULTRASOUND GUIDANCE FOR VASCULAR ACCESS OF THE RIGHT INTERNAL JUGULAR VEIN. 2. IVC VENOGRAM. 3. PERCUTANEOUS IVC FILTER PLACEMENT. ANESTHESIA/SEDATION: 2.0 mg IV Versed; 100 mcg IV Fentanyl. Total Moderate Sedation Time 18 minutes. The patient's level of consciousness and physiologic status were continuously monitored  during the procedure by Radiology nursing. CONTRAST:  64mL ISOVUE-300 IOPAMIDOL (ISOVUE-300) INJECTION 61%, 65mL ISOVUE-300 IOPAMIDOL (ISOVUE-300) INJECTION 61% FLUOROSCOPY TIME:  1 minutes and 42 seconds. PROCEDURE: The procedure, risks, benefits, and alternatives were explained to the patient. Questions regarding the procedure were encouraged and answered. The patient understands and consents to the procedure. A time-out was performed prior to initiating the procedure. The right neck was prepped with chlorhexidine in a sterile fashion, and a sterile drape was applied covering the operative field. A sterile gown and sterile gloves were used for the procedure. Local anesthesia was provided with 1% Lidocaine. Ultrasound was used to confirm patency of the right internal jugular vein. Under direct ultrasound guidance, a 21 gauge needle was advanced into the right internal jugular vein with ultrasound image documentation performed. After securing access with a micropuncture dilator, a guidewire was advanced into the inferior vena cava. A deployment sheath was advanced over the guidewire. This was utilized to perform IVC venography.  The deployment sheath was further positioned in an appropriate location for filter deployment. A Bard Denali IVC filter was then advanced in the sheath. This was then fully deployed in the infrarenal IVC. Final filter position was confirmed with a fluoroscopic spot image. After the procedure the sheath was removed and hemostasis obtained with manual compression. COMPLICATIONS: None. FINDINGS: IVC venography demonstrates a normal caliber IVC with no evidence of thrombus. Renal veins are identified bilaterally. The IVC filter was successfully positioned below the level of the renal veins and is appropriately oriented. This IVC filter has both permanent and retrievable indications. IMPRESSION: Placement of percutaneous IVC filter in infrarenal IVC. IVC venogram shows no evidence of IVC thrombus  and normal caliber of the inferior vena cava. This filter does have both permanent and retrievable indications. Due to patient related comorbidities, history of metastatic malignancy and clinical necessity, this IVC filter should be considered a permanent device. This patient will not be actively followed for future filter retrieval. Electronically Signed   By: Aletta Edouard M.D.   On: 11/13/2015 17:28        Scheduled Meds: . oxyCODONE  30 mg Oral Q12H  . senna-docusate  1 tablet Oral BID  . sertraline  50 mg Oral BID  . sodium chloride flush  3 mL Intravenous Q12H  . Tbo-filgastrim (GRANIX) SQ  480 mcg Subcutaneous q1800   Continuous Infusions: . sodium chloride 125 mL/hr at 11/14/15 0800  . heparin 1,700 Units/hr (11/14/15 0800)     LOS: 2 days    Time spent: 46 minutes    Shandra Szymborski, MD [facility] Pager [phone]  If 7PM-7AM, please contact night-coverage [email or web address] Password [email or web address] 11/14/2015, 8:44 AM

## 2015-11-15 ENCOUNTER — Other Ambulatory Visit: Payer: Self-pay | Admitting: Hematology and Oncology

## 2015-11-15 ENCOUNTER — Telehealth: Payer: Self-pay | Admitting: Hematology and Oncology

## 2015-11-15 DIAGNOSIS — D72829 Elevated white blood cell count, unspecified: Secondary | ICD-10-CM

## 2015-11-15 LAB — BASIC METABOLIC PANEL
ANION GAP: 8 (ref 5–15)
BUN: 12 mg/dL (ref 6–20)
CHLORIDE: 101 mmol/L (ref 101–111)
CO2: 24 mmol/L (ref 22–32)
Calcium: 9 mg/dL (ref 8.9–10.3)
Creatinine, Ser: 0.76 mg/dL (ref 0.61–1.24)
GFR calc Af Amer: >60 mL/min (ref >60)
GFR calc non Af Amer: >60 mL/min (ref >60)
GLUCOSE: 96 mg/dL (ref 65–99)
POTASSIUM: 4.1 mmol/L (ref 3.5–5.1)
Sodium: 133 mmol/L — ABNORMAL LOW (ref 135–145)

## 2015-11-15 LAB — APTT: aPTT: 82 seconds — ABNORMAL HIGH (ref 24–37)

## 2015-11-15 LAB — CBC
HEMATOCRIT: 28.5 % — AB (ref 39.0–52.0)
Hemoglobin: 9.4 g/dL — ABNORMAL LOW (ref 13.0–17.0)
MCH: 26.6 pg (ref 26.0–34.0)
MCHC: 33 g/dL (ref 30.0–36.0)
MCV: 80.5 fL (ref 78.0–100.0)
Platelets: 233 10*3/uL (ref 150–400)
RBC: 3.54 MIL/uL — AB (ref 4.22–5.81)
RDW: 14.4 % (ref 11.5–15.5)
WBC: 34.4 10*3/uL — AB (ref 4.0–10.5)

## 2015-11-15 LAB — HEPARIN LEVEL (UNFRACTIONATED): Heparin Unfractionated: 0.44 IU/mL (ref 0.30–0.70)

## 2015-11-15 LAB — GLUCOSE, CAPILLARY: GLUCOSE-CAPILLARY: 101 mg/dL — AB (ref 65–99)

## 2015-11-15 MED ORDER — SORBITOL 70 % SOLN
40.0000 mL | Freq: Two times a day (BID) | Status: DC
  Administered 2015-11-15 – 2015-11-17 (×3): 40 mL via ORAL
  Filled 2015-11-15 (×5): qty 60

## 2015-11-15 MED ORDER — BOOST / RESOURCE BREEZE PO LIQD
1.0000 | Freq: Two times a day (BID) | ORAL | Status: DC
  Administered 2015-11-15 – 2015-11-17 (×4): 1 via ORAL

## 2015-11-15 MED ORDER — RIVAROXABAN 15 MG PO TABS
15.0000 mg | ORAL_TABLET | Freq: Two times a day (BID) | ORAL | Status: DC
  Administered 2015-11-15 – 2015-11-17 (×5): 15 mg via ORAL
  Filled 2015-11-15 (×8): qty 1

## 2015-11-15 MED ORDER — PROCHLORPERAZINE EDISYLATE 5 MG/ML IJ SOLN
10.0000 mg | Freq: Four times a day (QID) | INTRAMUSCULAR | Status: DC | PRN
  Administered 2015-11-16 (×2): 10 mg via INTRAVENOUS
  Filled 2015-11-15 (×2): qty 2

## 2015-11-15 NOTE — Progress Notes (Signed)
Nathan [name]   DOB:[birth year]   [id number]    Subjective: His chest pain is under control. He continues to have poor intake and nausea. Denies vomiting. He complained of depression but denies suicidal ideation. He was started on Zoloft.The patient denies any recent signs or symptoms of bleeding such as spontaneous epistaxis, hematuria or hematochezia.   Objective:  Filed Vitals:   11/15/15 0600 11/15/15 0800  BP: 131/70   Pulse: 99   Temp:  98.1 F (36.7 C)  Resp: 29      Intake/Output Summary (Last 24 hours) at 11/15/15 [id number] Last data filed at 11/15/15 [id number]  Gross per 24 hour  Intake 2702.83 ml  Output   1600 ml  Net 1102.83 ml    GENERAL:alert, no distress and comfortable SKIN: skin color, texture, turgor are normal, no rashes or significant lesions EYES: normal, Conjunctiva are pink and non-injected, sclera clear Musculoskeletal:no cyanosis of digits and no clubbing  NEURO: alert & oriented x 3 with fluent speech, no focal motor/sensory deficits   Labs:  Lab Results  Component Value Date   WBC 34.4* 11/15/2015   HGB 9.4* 11/15/2015   HCT 28.5* 11/15/2015   MCV 80.5 11/15/2015   PLT 233 11/15/2015    Lab Results  Component Value Date   NA 133* 11/15/2015   K 4.1 11/15/2015   CL 101 11/15/2015   CO2 24 11/15/2015    Studies:  Ir Ivc Filter Plmt / S&i /img Guid/mod Sed  11/13/2015  CLINICAL DATA:  Stage IV metastatic pancreatic carcinoma with recent hospitalization for pulmonary embolism and DVT. Progressive bilateral pulmonary emboli despite anticoagulation with persistent residual thrombus in both lower extremity calf veins. The patient requires IVC filter placement. EXAM: 1. ULTRASOUND GUIDANCE FOR VASCULAR ACCESS OF THE RIGHT INTERNAL JUGULAR VEIN. 2. IVC VENOGRAM. 3. PERCUTANEOUS IVC FILTER PLACEMENT. ANESTHESIA/SEDATION: 2.0 mg IV Versed; 100 mcg IV Fentanyl. Total Moderate Sedation Time 18 minutes. The patient's level of consciousness and physiologic  status were continuously monitored during the procedure by Radiology nursing. CONTRAST:  79mL ISOVUE-300 IOPAMIDOL (ISOVUE-300) INJECTION 61%, 27mL ISOVUE-300 IOPAMIDOL (ISOVUE-300) INJECTION 61% FLUOROSCOPY TIME:  1 minutes and 42 seconds. PROCEDURE: The procedure, risks, benefits, and alternatives were explained to the patient. Questions regarding the procedure were encouraged and answered. The patient understands and consents to the procedure. A time-out was performed prior to initiating the procedure. The right neck was prepped with chlorhexidine in a sterile fashion, and a sterile drape was applied covering the operative field. A sterile gown and sterile gloves were used for the procedure. Local anesthesia was provided with 1% Lidocaine. Ultrasound was used to confirm patency of the right internal jugular vein. Under direct ultrasound guidance, a 21 gauge needle was advanced into the right internal jugular vein with ultrasound image documentation performed. After securing access with a micropuncture dilator, a guidewire was advanced into the inferior vena cava. A deployment sheath was advanced over the guidewire. This was utilized to perform IVC venography. The deployment sheath was further positioned in an appropriate location for filter deployment. A Bard Denali IVC filter was then advanced in the sheath. This was then fully deployed in the infrarenal IVC. Final filter position was confirmed with a fluoroscopic spot image. After the procedure the sheath was removed and hemostasis obtained with manual compression. COMPLICATIONS: None. FINDINGS: IVC venography demonstrates a normal caliber IVC with no evidence of thrombus. Renal veins are identified bilaterally. The IVC filter was successfully positioned below the level of the renal veins  and is appropriately oriented. This IVC filter has both permanent and retrievable indications. IMPRESSION: Placement of percutaneous IVC filter in infrarenal IVC. IVC venogram  shows no evidence of IVC thrombus and normal caliber of the inferior vena cava. This filter does have both permanent and retrievable indications. Due to patient related comorbidities, history of metastatic malignancy and clinical necessity, this IVC filter should be considered a permanent device. This patient will not be actively followed for future filter retrieval. Electronically Signed   By: Aletta Edouard M.D.   On: 11/13/2015 17:28    Assessment & Plan:   Carcinoma of pancreas metastatic to liver [facility]) The patient is experiencing significant nausea which may or may not be related to chemotherapy. Chemotherapy was discontinued on 11/13/15 per protocol. Future treatment to be given in the clinic He missed his dose of Neulasta on 11/13/2015. I will start him on G-CSF daily starting 11/14/2015 to prevent risk of neutropenic fever in the near future, to be resumed after discharge. I will arrange those injections in the clinic. No need to go home on Neupogen or Granix  Acute, worsening pulmonary embolism ([place]) with possible heart strain Possible ventricular mass on echocardiogram He has extensive DVT and PE and was on Xarelto. He is now admitted with evidence of new blood clots He is currently on IV heparin. He had IVC filter placed on 11/13/2015. I discussed extensively with pulmonologist. We plan to resume Xarelto upon discharge. TEE is not needed  Severe nausea and dehydration, resolved Could be related to side effects of treatment, his cancer and side effects from chemotherapy. Continue anti-emetics   Cancer associated pain and chest pain from recent PE He is currently taking combination therapy with extended release pain medicine and immediate release oxycodone for breakthrough pain medicine. Continue the same.  Constipation due to opioid therapy, resolved Continue aggressive regimen with laxatives  Protein-calorie malnutrition, moderate ([place]) He has lost a lot of weight with poor  oral intake due to cancer diagnosis. I reinforced the importance of frequent small meals.  Encounter for palliative care The patient is aware he has stage IV disease and treatment is strictly palliative. We discussed importance of Advanced Directives and Living will. We discussed CODE STATUS in the past; the patient desires to remain in full code. He has referral set up for palliative care service to follow as an outpatient  Discharge planning Hopefully discharge today or tomorrow He will return to the clinic around 1230 pm Monday 11/18/15 for labs, appointment to see me and injections Will sign off. Call if questions arise  [facility], George Haggart, MD 11/15/2015  8:33 AM

## 2015-11-15 NOTE — Progress Notes (Addendum)
PROGRESS NOTE    Nathan [name]  [id number] DOB: 1970/10/16 DOA: 11/12/2015 PCP: Almond Lint, MD   Brief Narrative:  Nathan [name] is a 45 y.o. 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  Pt reports that he received his first treatment with chemotherapy in cancer center 11/12/2015 . He experienced mild hypersensitivity reaction. He had confusion, nausea, vomiting and sweating. He was treated with benadryl, Pepcid, and Solu-Medrol. All symptoms resolved and patient was able to complete all of his chemotherapy with no further issue per Dr. Calton Dach note. Later on, he developed chest pain and SOB. His chest pain is located in the front chest, radiating to the back and epigastric area. It is constant, 8 out of 10 in severity, sharp pain. Patient does not have fever or chills, no cough. He also has abdominal pain, which is at his baseline. He reported that he had 3 bowel movement with loose stool on the day of admission. Patient did not have fever, chills, symptoms of UTI or unilateral weakness.  ED Course: pt was found to have lactate 1.34, troponin negative, lipase 97, WBC 17.5, temperature normal, oxygen saturation 94% on room air, tachycardia, renal function normal, negative chest x-ray. CT angiogram showed worsening extensive bilateral PE with evidence of right heart straining. Pt is admitted to [facility] as inpt. PCCM, Dr. Lavina Hamman was consulted by EDP.   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 Patient with history of stage IV pancreatic cancer with metastases to the liver on chemotherapy, history of recent bilateral PE and bilateral DVT on Xarelto. Patient states has been compliant with all medications. Worsening pulmonary emboli may be secondary to mobile DVT. Remain in the stepdown unit. Continue IV heparin. 2-D echo with EF of 60-65% with no wall motion abnormalities. Right ventricular systolic function mildly reduced and a medium-sized, 1.7 cm x 0.6 cm pedunculated mobile mass on the lateral wall concerning for right ventricular wall metastases of pancreatic cancer.  Lower extremity Dopplers with no significant change since prior study of 10/30/2015. Continue O2 to keep sats greater than 92%. Patient status post IVC filter per interventional radiology 11/13/2015. Continue IV heparin and in discussion with PCCM and oncology will likely transition to oral Xarelto twice a day dosing today. PCCM and oncology following and appreciate input and recommendations.   #2 tachycardia Likely secondary to problem #1. Improving.  #3 stage IV pancreatic cancer with metastases to the liver Patient received first chemotherapy yesterday as palliative treatment and had a mild hypersensitivity reactions which has since resolved. Patient to have chemotherapy discontinued 11/13/2015.  Dr. Alvy Bimler of oncology following. Patient will follow-up with oncology on Monday, 11/18/2015.  #4 bilateral DVT(acute right DVT in the peroneal vein/acute left DVT in posterior tibial and  peroneal veins) Repeat lower extremity Dopplers with no significant change since prior study of 10/30/2015. IV heparin. The  pulmonary to be transitioned to oral Xarelto today.  #5 1.7 cm x 0.6 cm pedunculated mobile mass in lateral wall of right ventricle concerning for metastases of pancreatic cancer Dr. Lake Bells, PCCM discussed 2-D echo findings with Dr. Marlou Porch of cardiology who feels 2-D echo findings and characteristics and location consistent with metastases of pancreatic cancer. No further cardiac workup needed at this time. Patient will likely need systemic chemotherapy and continuation of anticoagulation.  #6 nausea vomiting diarrhea abdominal pain Abdominal pain likely due to pancreatic cancer. Patient with a episode of nausea and emesis last night. No further diarrhea. Resumed Senokot-S yesterday. Resume home regimen sorbitol. today. IV fluids. Pain management. Supportive care. Patient on a regular diet. Monitor.   #7 moderate protein calorie malnutrition Nutritional supplementation.  #8 depression/anxiety Continue Zoloft. Ativan as needed.  #9 leukocytosis Likely secondary to problem #1. CT chest negative for infiltrate. Patient also received a dose of IV Solu-Medrol prior to admission while receiving his chemotherapy due to hypersensitivity reaction. Patient with no urinary symptoms. WBC trending back up likely secondary to GSF which patient was started on per oncology. Outpatient follow-up.   DVT prophylaxis: On IV heparin>>>>xarelto Code Status: Full Family Communication: Updated patient and mother at bedside.  Disposition Plan: Transfer to medsurg   Consultants:   PCCM: Georgann Housekeeper, NP  11/13/2015  Oncology/hematology: Dr. Alvy Bimler 11/13/2015  Procedures:   CT angiogram chest 11/12/2015  Chest x-ray 11/12/2015  2-D echo 11/13/2015  Lower extremity Dopplers 11/13/2015  IVC venogram and IVC filter placement 11/13/2015. Per Dr. Kathlene Cote.  Antimicrobials:   None   Subjective: Patient states chest pain has improved. Patient states some improvement with shortness of breath. Patient  with emesis and episode of nausea last night after eating. No nausea this morning. No emesis. Patient complaining of upper abdominal pain. No diarrhea. Patient states hasn't had a bowel movement. Per nursing patient also with complaints of depression.   Objective: Filed Vitals:   11/15/15 0400 11/15/15 0600 11/15/15 0800 11/15/15 0900  BP: 136/91 131/70 154/101   Pulse: 102 99 106 101  Temp: 98.2 F (36.8 C)  98.1 F (36.7 C)   TempSrc: Oral  Oral   Resp: 24 29 29 20   Height:      Weight:      SpO2: 95% 94% 96% 96%    Intake/Output Summary (Last 24 hours) at 11/15/15 0953 Last data filed at 11/15/15 0900  Gross per 24 hour  Intake   2916 ml  Output   1500 ml  Net   1416 ml   Filed Weights   11/12/15 1956 11/13/15 0024  Weight: 87.091 kg (192 lb) 82.3 kg (181 lb 7 oz)    Examination:  General exam: Appears calm and comfortable.  Respiratory system: Clear to auscultation. Respiratory effort normal. Cardiovascular system: Tachycardic. No JVD, murmurs, rubs, gallops or clicks. No pedal edema. Gastrointestinal system: Abdomen is nondistended, soft and tender to palpation mid to RUQ. No organomegaly or masses felt. Normal bowel sounds heard.  Central nervous system: Alert and oriented. No focal neurological deficits. Extremities: Symmetric 5 x 5 power. Skin: No rashes, lesions or ulcers Psychiatry: Judgement and insight appear normal. Mood & affect appropriate.     Data Reviewed: I have personally reviewed following labs and imaging studies  CBC:  Recent Labs Lab 11/12/15 2011 11/13/15 0130 11/14/15 0435 11/15/15 0615  WBC 17.5* 14.8* 12.4*  34.4*  HGB 11.1* 10.2* 10.2* 9.4*  HCT 33.3* 31.2* 31.4* 28.5*  MCV 80.4 81.7 82.4 80.5  PLT 210 189 214 0000000   Basic Metabolic Panel:  Recent Labs Lab 11/12/15 2011 11/13/15 0130 11/13/15 0816 11/14/15 0435 11/15/15 0615  NA 131* 132* 134* 133* 133*  K 4.2 4.4 4.6 4.8 4.1  CL 98* 100* 101 99* 101  CO2 23 24 24 27 24     GLUCOSE 129* 121* 110* 98 96  BUN 18 17 15 15 12   CREATININE 0.82 0.73 0.74 0.85 0.76  CALCIUM 9.8 9.2 9.4 9.3 9.0  MG  --   --   --  2.2  --    GFR: Estimated Creatinine Clearance: 119.1 mL/min (by C-G formula based on Cr of 0.76). Liver Function Tests:  Recent Labs Lab 11/12/15 2011 11/13/15 0130 11/14/15 0435  AST 42* 35 45*  ALT 44 39 46  ALKPHOS 399* 367* 374*  BILITOT 1.5* 1.7* 1.8*  PROT 7.7 7.2 6.9  ALBUMIN 2.9* 2.8* 2.7*    Recent Labs Lab 11/12/15 2011  LIPASE 79*   No results for input(s): AMMONIA in the last 168 hours. Coagulation Profile:  Recent Labs Lab 11/12/15 2011 11/13/15 1213  INR 2.59* 1.59*   Cardiac Enzymes:  Recent Labs Lab 11/12/15 2011 11/13/15 0816 11/13/15 1350  TROPONINI 0.03 0.03 0.03   BNP (last 3 results) No results for input(s): PROBNP in the last 8760 hours. HbA1C: No results for input(s): HGBA1C in the last 72 hours. CBG:  Recent Labs Lab 11/13/15 0734 11/14/15 0743 11/15/15 0742  GLUCAP 97 92 101*   Lipid Profile: No results for input(s): CHOL, HDL, LDLCALC, TRIG, CHOLHDL, LDLDIRECT in the last 72 hours. Thyroid Function Tests: No results for input(s): TSH, T4TOTAL, FREET4, T3FREE, THYROIDAB in the last 72 hours. Anemia Panel: No results for input(s): VITAMINB12, FOLATE, FERRITIN, TIBC, IRON, RETICCTPCT in the last 72 hours. Sepsis Labs:  Recent Labs Lab 11/12/15 2109  LATICACIDVEN 1.34    Recent Results (from the past 240 hour(s))  MRSA PCR Screening     Status: None   Collection Time: 11/13/15 12:25 AM  Result Value Ref Range Status   MRSA by PCR NEGATIVE NEGATIVE Final    Comment:        The GeneXpert MRSA Assay (FDA approved for NASAL specimens only), is one component of a comprehensive MRSA colonization surveillance program. It is not intended to diagnose MRSA infection nor to guide or monitor treatment for MRSA infections.          Radiology Studies: Ir Ivc Filter Plmt / S&i  /img Guid/mod Sed  11/13/2015  CLINICAL DATA:  Stage IV metastatic pancreatic carcinoma with recent hospitalization for pulmonary embolism and DVT. Progressive bilateral pulmonary emboli despite anticoagulation with persistent residual thrombus in both lower extremity calf veins. The patient requires IVC filter placement. EXAM: 1. ULTRASOUND GUIDANCE FOR VASCULAR ACCESS OF THE RIGHT INTERNAL JUGULAR VEIN. 2. IVC VENOGRAM. 3. PERCUTANEOUS IVC FILTER PLACEMENT. ANESTHESIA/SEDATION: 2.0 mg IV Versed; 100 mcg IV Fentanyl. Total Moderate Sedation Time 18 minutes. The patient's level of consciousness and physiologic status were continuously monitored during the procedure by Radiology nursing. CONTRAST:  30mL ISOVUE-300 IOPAMIDOL (ISOVUE-300) INJECTION 61%, 30mL ISOVUE-300 IOPAMIDOL (ISOVUE-300) INJECTION 61% FLUOROSCOPY TIME:  1 minutes and 42 seconds. PROCEDURE: The procedure, risks, benefits, and alternatives were explained to the patient. Questions regarding the procedure were encouraged and answered. The patient understands and consents to the procedure. A time-out was performed prior to initiating  the procedure. The right neck was prepped with chlorhexidine in a sterile fashion, and a sterile drape was applied covering the operative field. A sterile gown and sterile gloves were used for the procedure. Local anesthesia was provided with 1% Lidocaine. Ultrasound was used to confirm patency of the right internal jugular vein. Under direct ultrasound guidance, a 21 gauge needle was advanced into the right internal jugular vein with ultrasound image documentation performed. After securing access with a micropuncture dilator, a guidewire was advanced into the inferior vena cava. A deployment sheath was advanced over the guidewire. This was utilized to perform IVC venography. The deployment sheath was further positioned in an appropriate location for filter deployment. A Bard Denali IVC filter was then advanced in the  sheath. This was then fully deployed in the infrarenal IVC. Final filter position was confirmed with a fluoroscopic spot image. After the procedure the sheath was removed and hemostasis obtained with manual compression. COMPLICATIONS: None. FINDINGS: IVC venography demonstrates a normal caliber IVC with no evidence of thrombus. Renal veins are identified bilaterally. The IVC filter was successfully positioned below the level of the renal veins and is appropriately oriented. This IVC filter has both permanent and retrievable indications. IMPRESSION: Placement of percutaneous IVC filter in infrarenal IVC. IVC venogram shows no evidence of IVC thrombus and normal caliber of the inferior vena cava. This filter does have both permanent and retrievable indications. Due to patient related comorbidities, history of metastatic malignancy and clinical necessity, this IVC filter should be considered a permanent device. This patient will not be actively followed for future filter retrieval. Electronically Signed   By: Aletta Edouard M.D.   On: 11/13/2015 17:28        Scheduled Meds: . oxyCODONE  30 mg Oral Q12H  . Rivaroxaban  15 mg Oral BID WC  . senna-docusate  1 tablet Oral BID  . sertraline  50 mg Oral BID  . sodium chloride flush  3 mL Intravenous Q12H  . sorbitol  40 mL Oral BID  . Tbo-filgastrim (GRANIX) SQ  480 mcg Subcutaneous q1800   Continuous Infusions: . sodium chloride 100 mL/hr at 11/15/15 0615     LOS: 3 days    Time spent: 77 minutes    Yehonatan Grandison, MD [facility] Pager [phone]  If 7PM-7AM, please contact night-coverage [email or web address] Password [facility] 11/15/2015, 9:53 AM

## 2015-11-15 NOTE — Progress Notes (Addendum)
ANTICOAGULATION CONSULT NOTE - Follow Up Consult  Pharmacy Consult for Heparin --> rivaroxaban Indication: pulmonary embolus and DVT  No Known Allergies  Patient Measurements: Height: 5\' 9"  (175.3 cm) Weight: 181 lb 7 oz (82.3 kg) IBW/kg (Calculated) : 70.7 Heparin Dosing Weight: using total body weight  Vital Signs: Temp: 98.2 F (36.8 C) (05/19 0400) Temp Source: Oral (05/19 0400) BP: 131/70 mmHg (05/19 0600) Pulse Rate: 99 (05/19 0600)  Labs:  Recent Labs  11/12/15 2011 11/13/15 0130 11/13/15 0816 11/13/15 1213 11/13/15 1350  11/14/15 11/14/15 0435 11/14/15 0950 11/15/15 0615  HGB 11.1* 10.2*  --   --   --   --   --  10.2*  --  9.4*  HCT 33.3* 31.2*  --   --   --   --   --  31.4*  --  28.5*  PLT 210 189  --   --   --   --   --  214  --  233  APTT  --   --  157*  --   --   < > 93*  --  88* 82*  LABPROT 27.4*  --   --  19.0*  --   --   --   --   --   --   INR 2.59*  --   --  1.59*  --   --   --   --   --   --   HEPARINUNFRC  --   --  1.20*  --   --   < > 0.85*  --  0.72* 0.44  CREATININE 0.82 0.73 0.74  --   --   --   --  0.85  --   --   TROPONINI 0.03  --  0.03  --  0.03  --   --   --   --   --   < > = values in this interval not displayed.  Estimated Creatinine Clearance: 112.1 mL/min (by C-G formula based on Cr of 0.85).   Assessment: 29 yoM with history of metastatic pancreatic cancer, currently receiving chemotherapy, with recent PE/DVT noted on 10/28/2015 admit to [facility]. Patient was started on IV heparin infusion during that admission (held briefly for procedures - liver biopsy and PAC placement) and then transitioned to Xarelto on 5/9 prior to discharge on 5/11.  Patient presents to [facility] ED on 5/16 with worsening SOB and CP, now has progression of bilateral extensive PE with possible right heart strain.  Patient was taking rivaroxaban 15mg  po bid PTA with last dose noted 5/16 at 1700 on med history. Patient states that he has been compliant with his medications.  MD  notes that worsening pulmonary emboli may be secondary to mobile DVT. Question of R ventricular mass per TTE.  PCCM notes indicate doubt Xarelto failure.  Significant events: 5/17 IR placed IVC filter.  ECHO showing mobile mass in RV.  Today, 11/15/2015: - aPTT 82 seconds, heparin level = 0.44 - both therapeutic.Heparin infusing at 1700 units/hr.    - Suspect no longer seeing effects of xarelto on anti-Xa  - CBC: Hgb 9.4 - trending down, pltc WNL - No bleeding reported.  - Likely plan to transition back to Xarelto.  Regarding ventricular mass - No role for TEE per cardiology.  No TPA given high likelihood of brain mets.  No surgical resection, too risky per PCCM.  Goal of Therapy:  Heparin level 0.3-0.7 units/ml aPTT 66-102 seconds Monitor platelets by anticoagulation protocol: Yes  Plan:  -Continue heparin infusion at 1700 units/hr. -Check aPTT and heparin level in AM - suspect can d/c daily aptt after tomorrow's labs. -Daily CBC - watch Hgb -F/u plan for possible transition to Xarelto. Plan per notes will be to restart xarelto at 15mg  BID x 21 days  Doreene Eland, PharmD, BCPS.   Pager: [phone] 11/15/2015 7:10 AM  Addendum: Orders to change back to xarelto, pharmacy to dose  Xarelto 15mg  PO BIDWC x 21 days, will restart clock with today being day #1 (discussed this with PCCM and agree with plan and state Oncology planned on doing same)  D/c heparin labs  Doreene Eland, PharmD, BCPS.   Pager: [phone] 11/15/2015 9:10 AM

## 2015-11-15 NOTE — Progress Notes (Signed)
Name: Nathan [name] MRN: [id number] DOB: 02-14-1971    ADMISSION DATE:  11/12/2015 CONSULTATION DATE:  11/13/2015  REFERRING MD :  EDP  CHIEF COMPLAINT:  SOB  BRIEF PATIENT DESCRIPTION: 45 year old male with recent dx of stage IV pancreatic cancer and subsequent pulmonary embolism with extenisve DVT during admission 10/28/2015. He was started on xarelto, however presented 5/16 with worsening SOB and chest pain. Found to have worsening PE & PCCM consulted for evaluation.    SUBJECTIVE:  No acute events On heparin gtt Nausea persists  VITAL SIGNS: Temp:  [98.1 F (36.7 C)-99.1 F (37.3 C)] 98.1 F (36.7 C) (05/19 0800) Pulse Rate:  [81-109] 99 (05/19 0600) Resp:  [15-29] 29 (05/19 0600) BP: (121-147)/(70-106) 131/70 mmHg (05/19 0600) SpO2:  [94 %-98 %] 94 % (05/19 0600)  PHYSICAL EXAMINATION: General:  No distress in bed HENT: NCAT OP clear PULM: CTA B CV: RRR, no mgr GI: BS+, mildly tender RUQ MSK: normal bulk an tone Neuro: awake, alert   Recent Labs Lab 11/13/15 0816 11/14/15 0435 11/15/15 0615  NA 134* 133* 133*  K 4.6 4.8 4.1  CL 101 99* 101  CO2 24 27 24   BUN 15 15 12   CREATININE 0.74 0.85 0.76  GLUCOSE 110* 98 96    Recent Labs Lab 11/13/15 0130 11/14/15 0435 11/15/15 0615  HGB 10.2* 10.2* 9.4*  HCT 31.2* 31.4* 28.5*  WBC 14.8* 12.4* 34.4*  PLT 189 214 233   Ir Ivc Filter Plmt / S&i /img Guid/mod Sed  11/13/2015  CLINICAL DATA:  Stage IV metastatic pancreatic carcinoma with recent hospitalization for pulmonary embolism and DVT. Progressive bilateral pulmonary emboli despite anticoagulation with persistent residual thrombus in both lower extremity calf veins. The patient requires IVC filter placement. EXAM: 1. ULTRASOUND GUIDANCE FOR VASCULAR ACCESS OF THE RIGHT INTERNAL JUGULAR VEIN. 2. IVC VENOGRAM. 3. PERCUTANEOUS IVC FILTER PLACEMENT. ANESTHESIA/SEDATION: 2.0 mg IV Versed; 100 mcg IV Fentanyl. Total Moderate Sedation Time 18 minutes. The  patient's level of consciousness and physiologic status were continuously monitored during the procedure by Radiology nursing. CONTRAST:  33mL ISOVUE-300 IOPAMIDOL (ISOVUE-300) INJECTION 61%, 69mL ISOVUE-300 IOPAMIDOL (ISOVUE-300) INJECTION 61% FLUOROSCOPY TIME:  1 minutes and 42 seconds. PROCEDURE: The procedure, risks, benefits, and alternatives were explained to the patient. Questions regarding the procedure were encouraged and answered. The patient understands and consents to the procedure. A time-out was performed prior to initiating the procedure. The right neck was prepped with chlorhexidine in a sterile fashion, and a sterile drape was applied covering the operative field. A sterile gown and sterile gloves were used for the procedure. Local anesthesia was provided with 1% Lidocaine. Ultrasound was used to confirm patency of the right internal jugular vein. Under direct ultrasound guidance, a 21 gauge needle was advanced into the right internal jugular vein with ultrasound image documentation performed. After securing access with a micropuncture dilator, a guidewire was advanced into the inferior vena cava. A deployment sheath was advanced over the guidewire. This was utilized to perform IVC venography. The deployment sheath was further positioned in an appropriate location for filter deployment. A Bard Denali IVC filter was then advanced in the sheath. This was then fully deployed in the infrarenal IVC. Final filter position was confirmed with a fluoroscopic spot image. After the procedure the sheath was removed and hemostasis obtained with manual compression. COMPLICATIONS: None. FINDINGS: IVC venography demonstrates a normal caliber IVC with no evidence of thrombus. Renal veins are identified bilaterally. The IVC filter was successfully  positioned below the level of the renal veins and is appropriately oriented. This IVC filter has both permanent and retrievable indications. IMPRESSION: Placement of  percutaneous IVC filter in infrarenal IVC. IVC venogram shows no evidence of IVC thrombus and normal caliber of the inferior vena cava. This filter does have both permanent and retrievable indications. Due to patient related comorbidities, history of metastatic malignancy and clinical necessity, this IVC filter should be considered a permanent device. This patient will not be actively followed for future filter retrieval. Electronically Signed   By: Aletta Edouard M.D.   On: 11/13/2015 17:28   SIGNIFICANT EVENTS  5/1-5/11 admit, diagnosed with stage IV pancreatic Ca and PE 5/16  Admit with worsening PE 5/17  Placement of IVC filter  STUDIES:  LE doppler 5/03 > Findings consistent with acute deep vein thrombosis involving the right peroneal vein, left posterial tibial vein, and leftperoneal vein. Acute superficial vein thrombosis involving the left small saphenous vein. CTA chest 5/16 > Extensive bilateral pulmonary artery emboli with findings concerning for a degree of right cardiac straining. Clot burden worse since prior study. Correlation with clinical exam and echocardiogram recommended. Mildly enlarged subcarinal lymph node measuring 1.4 cm short axis. Extensive hepatic metastatic disease.  ECHO 5/17 > LVEF 60-65%, normal wall motion, RV systolic function mildly reduced, 1.7 cm, pedunculated, mobile mass on the lateral wall of RV, concern for RV wall metastasis of pancreatic cancer  ASSESSMENT / PLAN:  Bilateral pulmonary embolism with suspected R heart strain - s/p IVC filter placement 5/18 Hypoxia resolved  Recommendations: - pulmonary hygiene - IS, mobilize as able - change heparin to Xarelto, no other good option for anticoagulation - In general we recommend IVC filter removal in 6 weeks, but will defer to Heme Onc as clearly he may not live to a point where this is feasible  Leukocytosis> due to GCSF   Will transfer to floor PCCM to sign off  Roselie Awkward, MD [place]  PCCM Pager: [phone] Cell: [phone] After 3pm or if no response, call [phone]

## 2015-11-15 NOTE — Progress Notes (Signed)
Nutrition Follow-up  DOCUMENTATION CODES:   Severe malnutrition in context of acute illness/injury  INTERVENTION:  - Will order Boost Breeze BID, each supplement provides 250 kcal and 9 grams of protein - RD will continue to monitor for needs, need to adjust supplement order  NUTRITION DIAGNOSIS:   Increased nutrient needs related to catabolic illness, cancer and cancer related treatments as evidenced by estimated needs. -ongoing  GOAL:   Patient will meet greater than or equal to 90% of their needs -unmet  MONITOR:   PO intake, Supplement acceptance, Weight trends, Labs, Skin, I & O's  ASSESSMENT:   45 y.o. 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  5/19 [place] [facility] [place] [facility] [place] this AM. Pt sleeping at time of RD visit and all information provided by significant other, who is at bedside. She reports that pt has been doing very well with liquids, including juices, and that he was very hungry last night due to poor intakes x3 weeks PTA. She states pt ate chicken for dinner and enjoyed it but later vomited due to meal being "too heavy." Talked with her about lighter options and some foods that pt may do better with at this time. She states that pt was transferred in the middle of breakfast today and did not eat as much as he otherwise may have.   Will order Boost Breeze to trial. Pt had IVC filter placed 11/13/15. Notes indicate pt on Folfirinox for stage 4 pancreatic cancer with liver mets and plan for Palliative to follow as an outpatient; not currently following on inpatient basis.   Continue to be unable to complete physical assessment at this time with respect to pt's comfort but will attempt at next follow-up. Medications reviewed. IVF: NS @ 100 mL/hr. Labs reviewed; Na: 133  mmol/L.     5/17 - Diet advanced from NPO to CLD around 0900 today and significant other states that pt has been unable to have anything despite diet advancement due to testing scheduled. - Pt with eyes closed during visit and is very minimally interactive, prefers for more in-depth conversation at another time.  - Pt states that since initiation of chemo on 11/11/15 he has had taste alteration which he is unable to specify other than "bad taste."  - Significant other states that since chemo initiation pt has only had a few bites of food and sips of liquids; pt states that poor appetite began prior to chemo initiation but is unable to specify time frame.  - He is able to state that he was eating poorly leading up to chemo initiation.  - Unable to obtain further information at this time.  - Notes in chart indicate pt with N/V/D and abdominal pain PTA and that Dr. Alvy Bimler talked with pt about the importance of small, frequent meals.  - Unable to complete physical assessment at this time with respect to pt's wishes; will complete at follow-up.   Diet Order:  Diet regular Room service appropriate?: Yes; Fluid consistency:: Thin  Skin:  Wound (see comment) (R neck puncture point from IVC filter placement 11/13/15)  Last BM:  5/16  Height:   Ht Readings from Last 1 Encounters:  11/13/15 5\' 9"  (1.753 m)    Weight:   Wt Readings from Last 1 Encounters:  11/13/15 181 lb 7 oz (82.3 kg)  Ideal Body Weight:  72.73 kg (kg)  BMI:  Body mass index is 26.78 kg/(m^2).  Estimated Nutritional Needs:   Kcal:  [id number] (28-32 kcal/kg)  Protein:  115-132 grams (1.4-1.6 grams/kg)  Fluid:  >/= 2.3 L/day  EDUCATION NEEDS:   No education needs identified at this time     Jarome Matin, RD, LDN Inpatient Clinical Dietitian Pager # [phone] After hours/weekend pager # [phone]

## 2015-11-15 NOTE — Telephone Encounter (Signed)
s.w. pt and advised on 5.22 appt....pt ok and aware

## 2015-11-15 NOTE — Discharge Instructions (Addendum)
Information on my medicine - XARELTO (rivaroxaban)  This medication education was reviewed with me or my healthcare representative as part of my discharge preparation.  The pharmacist that spoke with me during my hospital stay was:  Henreitta Leber. PharmD  WHY WAS [place]? Xarelto was prescribed to treat blood clots that may have been found in the veins of your legs (deep vein thrombosis) or in your lungs (pulmonary embolism) and to reduce the risk of them occurring again.  What do you need to know about Xarelto? The starting dose is one 15 mg tablet taken TWICE daily with food for the FIRST 21 DAYS then on June 8th the dose is changed to one 20 mg tablet taken ONCE A DAY with your evening meal.  DO NOT stop taking Xarelto without talking to the health care provider who prescribed the medication.  Refill your prescription for 20 mg tablets before you run out.  After discharge, you should have regular check-up appointments with your healthcare provider that is prescribing your Xarelto.  In the future your dose may need to be changed if your kidney function changes by a significant amount.  What do you do if you miss a dose? If you are taking Xarelto TWICE DAILY and you miss a dose, take it as soon as you remember. You may take two 15 mg tablets (total 30 mg) at the same time then resume your regularly scheduled 15 mg twice daily the next day.  If you are taking Xarelto ONCE DAILY and you miss a dose, take it as soon as you remember on the same day then continue your regularly scheduled once daily regimen the next day. Do not take two doses of Xarelto at the same time.   Important Safety Information Xarelto is a blood thinner medicine that can cause bleeding. You should call your healthcare provider right away if you experience any of the following: ? Bleeding from an injury or your nose that does not stop. ? Unusual colored urine (red or dark brown) or unusual colored  stools (red or black). ? Unusual bruising for unknown reasons. ? A serious fall or if you hit your head (even if there is no bleeding).  Some medicines may interact with Xarelto and might increase your risk of bleeding while on Xarelto. To help avoid this, consult your healthcare provider or pharmacist prior to using any new prescription or non-prescription medications, including herbals, vitamins, non-steroidal anti-inflammatory drugs (NSAIDs) and supplements.  This website has more information on Xarelto: [email or web address]

## 2015-11-16 DIAGNOSIS — E43 Unspecified severe protein-calorie malnutrition: Secondary | ICD-10-CM

## 2015-11-16 DIAGNOSIS — C801 Malignant (primary) neoplasm, unspecified: Secondary | ICD-10-CM

## 2015-11-16 DIAGNOSIS — R0600 Dyspnea, unspecified: Secondary | ICD-10-CM | POA: Insufficient documentation

## 2015-11-16 DIAGNOSIS — C799 Secondary malignant neoplasm of unspecified site: Secondary | ICD-10-CM | POA: Insufficient documentation

## 2015-11-16 LAB — BASIC METABOLIC PANEL
ANION GAP: 6 (ref 5–15)
BUN: 12 mg/dL (ref 6–20)
CALCIUM: 8.9 mg/dL (ref 8.9–10.3)
CO2: 25 mmol/L (ref 22–32)
Chloride: 103 mmol/L (ref 101–111)
Creatinine, Ser: 0.96 mg/dL (ref 0.61–1.24)
Glucose, Bld: 94 mg/dL (ref 65–99)
Potassium: 4 mmol/L (ref 3.5–5.1)
Sodium: 134 mmol/L — ABNORMAL LOW (ref 135–145)

## 2015-11-16 LAB — CBC
HEMATOCRIT: 28.1 % — AB (ref 39.0–52.0)
HEMOGLOBIN: 9.2 g/dL — AB (ref 13.0–17.0)
MCH: 27.1 pg (ref 26.0–34.0)
MCHC: 32.7 g/dL (ref 30.0–36.0)
MCV: 82.6 fL (ref 78.0–100.0)
Platelets: 122 10*3/uL — ABNORMAL LOW (ref 150–400)
RBC: 3.4 MIL/uL — ABNORMAL LOW (ref 4.22–5.81)
RDW: 14.7 % (ref 11.5–15.5)
WBC: 37.3 10*3/uL — ABNORMAL HIGH (ref 4.0–10.5)

## 2015-11-16 LAB — GLUCOSE, CAPILLARY: Glucose-Capillary: 87 mg/dL (ref 65–99)

## 2015-11-16 NOTE — Progress Notes (Signed)
PROGRESS NOTE    Nathan [name]  [id number] DOB: 02-25-1971 DOA: 11/12/2015 PCP: Almond Lint, MD   Brief Narrative:  BAKER SHIFRIN is a 45 y.o. 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  Pt reports that he received his first treatment with chemotherapy in cancer center 11/12/2015 . He experienced mild hypersensitivity reaction. He had confusion, nausea, vomiting and sweating. He was treated with benadryl, Pepcid, and Solu-Medrol. All symptoms resolved and patient was able to complete all of his chemotherapy with no further issue per Dr. Calton Dach note. Later on, he developed chest pain and SOB. His chest pain is located in the front chest, radiating to the back and epigastric area. It is constant, 8 out of 10 in severity, sharp pain. Patient does not have fever or chills, no cough. He also has abdominal pain, which is at his baseline. He reported that he had 3 bowel movement with loose stool on the day of admission. Patient did not have fever, chills, symptoms of UTI or unilateral weakness.  ED Course: pt was found to have lactate 1.34, troponin negative, lipase 97, WBC 17.5, temperature normal, oxygen saturation 94% on room air, tachycardia, renal function normal, negative chest x-ray. CT angiogram showed worsening extensive bilateral PE with evidence of right heart straining. Pt is admitted to [facility] as inpt. PCCM, Dr. Lavina Hamman was consulted by EDP.   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 Patient with history of stage IV pancreatic cancer with metastases to the liver on chemotherapy, history of recent bilateral PE and bilateral DVT on Xarelto. Patient states has been compliant with all medications. Worsening pulmonary emboli may be secondary to mobile DVT. Remain in the stepdown unit. Continue IV heparin. 2-D echo with EF of 60-65% with no wall motion abnormalities. Right ventricular systolic function mildly reduced and a medium-sized, 1.7 cm x 0.6 cm pedunculated mobile mass on the lateral wall concerning for right ventricular wall metastases of pancreatic cancer.  Lower extremity Dopplers with no significant change since prior study of 10/30/2015. Continue O2 to keep sats greater than 92%. Patient status post IVC filter per interventional radiology 11/13/2015. Patient has been transitioned from IV heparin to Xarelto twice a day dosing. PCCM and oncology following and appreciate input and recommendations.   #2 tachycardia Likely secondary to problem #1. Improving.  #3 stage IV pancreatic cancer with metastases to the liver Patient received first chemotherapy yesterday as palliative treatment and had a mild hypersensitivity reactions which has since resolved. Patient to have chemotherapy discontinued 11/13/2015.  Dr. Alvy Bimler of oncology following. Patient will follow-up with oncology on Monday, 11/18/2015.  #4 bilateral DVT(acute right DVT in the peroneal vein/acute left DVT in posterior tibial and peroneal veins) Repeat lower extremity Dopplers with no  significant change since prior study of 10/30/2015. Patient has been transitioned from IV heparin to Xarelto.   #5  1.7 cm x 0.6 cm pedunculated mobile mass in lateral wall of right ventricle concerning for metastases of pancreatic cancer Dr. Lake Bells, PCCM discussed 2-D echo findings with Dr. Marlou Porch of cardiology who feels 2-D echo findings and characteristics and location consistent with metastases of pancreatic cancer. No further cardiac workup needed at this time. Patient will likely need systemic chemotherapy and continuation of anticoagulation.  #6 nausea vomiting diarrhea abdominal pain Abdominal pain likely due to pancreatic cancer. No further diarrhea. Resumed Senokot-S and home regimen sorbitol. IV fluids. Pain management. Supportive care. Patient on a regular diet. Monitor.   #7 moderate protein calorie malnutrition Nutritional supplementation.  #8 depression/anxiety Continue Zoloft. Ativan as needed.  #9 leukocytosis Likely secondary to problem #1. CT chest negative for infiltrate. Patient also received a dose of IV Solu-Medrol prior to admission while receiving his chemotherapy due to hypersensitivity reaction. Patient with no urinary symptoms. WBC trending back up likely secondary to GSF which patient was started on per oncology. Outpatient follow-up.   DVT prophylaxis: On IV heparin>>>>xarelto Code Status: Full Family Communication: Updated patient and wife at bedside.  Disposition Plan: Home hopefully tomorrow.   Consultants:   PCCM: Georgann Housekeeper, NP  11/13/2015  Oncology/hematology: Dr. Alvy Bimler 11/13/2015  Procedures:   CT angiogram chest 11/12/2015  Chest x-ray 11/12/2015  2-D echo 11/13/2015  Lower extremity Dopplers 11/13/2015  IVC venogram and IVC filter placement 11/13/2015. Per Dr. Kathlene Cote.  Antimicrobials:   None   Subjective: Patient states chest pain has improved. Patient states some improvement with shortness of breath. Patient states had an episode of nasal bleeding early on today.  Objective: Filed Vitals:   11/15/15 1338 11/15/15 2111 11/16/15 0525  11/16/15 1330  BP: 122/80 138/78 130/92 134/100  Pulse: 103 103 101 99  Temp: 97.8 F (36.6 C) 98.7 F (37.1 C) 98.5 F (36.9 C) 98.3 F (36.8 C)  TempSrc: Oral Oral Oral Oral  Resp: 15 18 18 18   Height:      Weight:      SpO2: 98% 98% 99% 100%    Intake/Output Summary (Last 24 hours) at 11/16/15 1753 Last data filed at 11/16/15 1230  Gross per 24 hour  Intake 7114.5 ml  Output   1000 ml  Net 6114.5 ml   Filed Weights   11/12/15 1956 11/13/15 0024  Weight: 87.091 kg (192 lb) 82.3 kg (181 lb 7 oz)    Examination:  General exam: Appears calm and comfortable.  Respiratory system: Clear to auscultation. Respiratory effort normal. Cardiovascular system: Tachycardic. No JVD, murmurs, rubs, gallops or clicks. No pedal edema. Gastrointestinal system: Abdomen is nondistended, soft and tender to palpation mid to RUQ. No organomegaly or masses felt. Normal bowel sounds heard.  Central nervous system: Alert and oriented. No focal neurological deficits. Extremities: Symmetric 5 x 5 power. Skin: No rashes, lesions or ulcers Psychiatry: Judgement and insight appear normal. Mood & affect appropriate.     Data Reviewed: I have personally reviewed following labs and imaging studies  CBC:  Recent Labs Lab 11/12/15 2011 11/13/15 0130 11/14/15 0435 11/15/15 0615 11/16/15 0432  WBC 17.5* 14.8* 12.4* 34.4* 37.3*  HGB 11.1* 10.2* 10.2* 9.4* 9.2*  HCT 33.3* 31.2* 31.4* 28.5* 28.1*  MCV 80.4 81.7 82.4 80.5 82.6  PLT 210 189 214 233 [id number]*   Basic Metabolic Panel:  Recent Labs Lab 11/13/15 0130 11/13/15 0816 11/14/15 0435 11/15/15 [id number]  11/16/15 0432  NA 132* 134* 133* 133* 134*  K 4.4 4.6 4.8 4.1 4.0  CL 100* 101 99* 101 103  CO2 24 24 27 24 25   GLUCOSE 121* 110* 98 96 94  BUN 17 15 15 12 12   CREATININE 0.73 0.74 0.85 0.76 0.96  CALCIUM 9.2 9.4 9.3 9.0 8.9  MG  --   --  2.2  --   --    GFR: Estimated Creatinine Clearance: 99.2 mL/min (by C-G formula based on Cr of  0.96). Liver Function Tests:  Recent Labs Lab 11/12/15 2011 11/13/15 0130 11/14/15 0435  AST 42* 35 45*  ALT 44 39 46  ALKPHOS 399* 367* 374*  BILITOT 1.5* 1.7* 1.8*  PROT 7.7 7.2 6.9  ALBUMIN 2.9* 2.8* 2.7*    Recent Labs Lab 11/12/15 2011  LIPASE 79*   No results for input(s): AMMONIA in the last 168 hours. Coagulation Profile:  Recent Labs Lab 11/12/15 2011 11/13/15 1213  INR 2.59* 1.59*   Cardiac Enzymes:  Recent Labs Lab 11/12/15 2011 11/13/15 0816 11/13/15 1350  TROPONINI 0.03 0.03 0.03   BNP (last 3 results) No results for input(s): PROBNP in the last 8760 hours. HbA1C: No results for input(s): HGBA1C in the last 72 hours. CBG:  Recent Labs Lab 11/13/15 0734 11/14/15 0743 11/15/15 0742 11/16/15 0750  GLUCAP 97 92 101* 87   Lipid Profile: No results for input(s): CHOL, HDL, LDLCALC, TRIG, CHOLHDL, LDLDIRECT in the last 72 hours. Thyroid Function Tests: No results for input(s): TSH, T4TOTAL, FREET4, T3FREE, THYROIDAB in the last 72 hours. Anemia Panel: No results for input(s): VITAMINB12, FOLATE, FERRITIN, TIBC, IRON, RETICCTPCT in the last 72 hours. Sepsis Labs:  Recent Labs Lab 11/12/15 2109  LATICACIDVEN 1.34    Recent Results (from the past 240 hour(s))  MRSA PCR Screening     Status: None   Collection Time: 11/13/15 12:25 AM  Result Value Ref Range Status   MRSA by PCR NEGATIVE NEGATIVE Final    Comment:        The GeneXpert MRSA Assay (FDA approved for NASAL specimens only), is one component of a comprehensive MRSA colonization surveillance program. It is not intended to diagnose MRSA infection nor to guide or monitor treatment for MRSA infections.          Radiology Studies: No results found.      Scheduled Meds: . feeding supplement  1 Container Oral BID BM  . oxyCODONE  30 mg Oral Q12H  . Rivaroxaban  15 mg Oral BID WC  . senna-docusate  1 tablet Oral BID  . sertraline  50 mg Oral BID  . sodium  chloride flush  3 mL Intravenous Q12H  . sorbitol  40 mL Oral BID  . Tbo-filgastrim (GRANIX) SQ  480 mcg Subcutaneous q1800   Continuous Infusions: . sodium chloride 75 mL/hr at 11/16/15 0438     LOS: 4 days    Time spent: 50 minutes    THOMPSON,DANIEL, MD [facility] Pager [phone]  If 7PM-7AM, please contact night-coverage [email or web address] Password [facility] 11/16/2015, 5:53 PM

## 2015-11-17 LAB — CBC
HEMATOCRIT: 26.4 % — AB (ref 39.0–52.0)
Hemoglobin: 8.9 g/dL — ABNORMAL LOW (ref 13.0–17.0)
MCH: 27 pg (ref 26.0–34.0)
MCHC: 33.7 g/dL (ref 30.0–36.0)
MCV: 80 fL (ref 78.0–100.0)
Platelets: 100 10*3/uL — ABNORMAL LOW (ref 150–400)
RBC: 3.3 MIL/uL — ABNORMAL LOW (ref 4.22–5.81)
RDW: 14.7 % (ref 11.5–15.5)
WBC: 41.6 10*3/uL — ABNORMAL HIGH (ref 4.0–10.5)

## 2015-11-17 LAB — GLUCOSE, CAPILLARY: GLUCOSE-CAPILLARY: 88 mg/dL (ref 65–99)

## 2015-11-17 MED ORDER — SERTRALINE HCL 50 MG PO TABS: 50.0000 mg | ORAL_TABLET | Freq: Two times a day (BID) | ORAL | Status: AC

## 2015-11-17 MED ORDER — ALPRAZOLAM 0.5 MG PO TABS: 0.5000 mg | ORAL_TABLET | Freq: Three times a day (TID) | ORAL | Status: AC | PRN

## 2015-11-17 MED ORDER — BOOST / RESOURCE BREEZE PO LIQD: 1.0000 | Freq: Two times a day (BID) | ORAL | Status: AC

## 2015-11-17 NOTE — Discharge Summary (Signed)
Physician Discharge Summary  Nathan [name] ZWC:[id number] DOB: [birth year] DOA: 11/12/2015  PCP: Almond Lint, MD  Admit date: 11/12/2015 Discharge date: 11/17/2015  Time spent: 65 minutes  Recommendations for Outpatient Follow-up:  1. Patient is to follow-up with Dr.Gorsuch on Monday, 11/18/2015 at 12:30 PM.   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 [facility])   Encounter for palliative care   Tachycardia   Protein-calorie malnutrition, moderate (HCC)   DVT (deep venous thrombosis) (HCC)   Nausea, vomiting, and diarrhea   PE (pulmonary embolism)   Depression with anxiety   Leukocytosis   Pulmonary embolism with acute cor pulmonale (HCC)   Recurrent pulmonary emboli (HCC)   Protein-calorie malnutrition, severe   Right ventricular mass: Per 2 d echo 11/13/2015   Dyspnea   Metastatic cancer [facility])   Discharge Condition: Stable and improved  Diet recommendation: Regular  Filed Weights   11/12/15 1956 11/13/15 0024  Weight: 87.091 kg (192 lb) 82.3 kg (181 lb 7 oz)    History of present illness:  Per Dr Urban Gibson is a 45 y.o. 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  Pt reported that he received his first treatment with chemotherapy in cancer center 1 day prior to admission. He experienced mild hypersensitivity reaction. He had confusion, nausea, vomiting and sweating. He was treated with benadryl, Pepcid, and Solu-Medrol. All symptoms resolved and patient was able to complete all of his chemotherapy with no further issue per Dr. Calton Dach note. Later on the day of admission, he developed chest pain and SOB. His chest pain was located in the front chest, radiating to the back and  epigastric area. It is constant, 8 out of 10 in severity, sharp pain. Patient does not have fever or chills, no cough. He also has abdominal pain, which is at his baseline. He reported that he had 3 bowel movement with loose stool on the day of admission. Patient did not have fever, chills, symptoms of UTI or unilateral weakness.  ED Course: pt was found to have lactate 1.34, troponin negative, lipase 97, WBC 17.5, temperature normal, oxygen saturation 94% on room air, tachycardia, renal function normal, negative chest x-ray. CT angiogram showed worsening extensive bilateral PE with evidence of right heart straining. Pt was admitted to SDU as inpt. PCCM, Dr. Lavina Hamman was consulted by EDP.  Hospital Course:  #1 worsening bilateral pulmonary emboli with possible right heart strain Patient had presented with chest pain and worsening shortness of breath. Patient with history of stage IV pancreatic cancer with metastases to the liver on chemotherapy, history of recent bilateral PE and bilateral DVT on Xarelto. Patient stated has been compliant with all medications. Worsening pulmonary emboli may be secondary to mobile DVT. Patient was initially admitted to the step down unit and placed on IV heparin and pulmonary and hematology consulted. 2-D echo with EF of 60-65% with no wall motion abnormalities. Right ventricular systolic function mildly reduced and a medium-sized, 1.7 cm x 0.6 cm pedunculated mobile mass on the lateral wall concerning for right ventricular wall metastases of pancreatic cancer. Lower extremity Dopplers with no significant change since prior study of 10/30/2015. Patient was maintained on oxygen. Interventional radiology was consulted and patient underwent IVC filter per interventional radiology 11/13/2015. Patient was subsequently transitioned  from IV heparin to Xarelto twice a day dosing per pulmonary and hematology/oncology recommendations. Patient improved clinically was transferred to the  regular floor where he continued to improve. Patient will be discharged home in stable and improved condition and will follow-up with hematology oncology on Monday, 11/18/2015.  #2 tachycardia Likely secondary to problem #1. Improved.  #3 stage IV pancreatic cancer with metastases to the liver Patient received first chemotherapy one day prior to admission, as palliative treatment and had a mild hypersensitivity reactions which has since resolved. Patient on admission had chemotherapy ongoing which was subsequently discontinued per oncology on 11/13/2015. Dr. Alvy Bimler of oncology followed the patient during the hospitalization. Patient will follow-up with oncology on Monday, 11/18/2015.  #4 bilateral DVT(acute right DVT in the peroneal vein/acute left DVT in posterior tibial and peroneal veins) Repeat lower extremity Dopplers with no significant change since prior study of 10/30/2015. Patient was initially placed empirically on IV heparin on admission subsequently received IVC filter and was transitioned back to Xarelto per recommendations of hematology and pulmonary.   #5 1.7 cm x 0.6 cm pedunculated mobile mass in lateral wall of right ventricle concerning for metastases of pancreatic cancer Dr. Lake Bells, PCCM discussed 2-D echo findings with Dr. Marlou Porch of cardiology who feels 2-D echo findings and characteristics and location consistent with metastases of pancreatic cancer. No further cardiac workup needed at this time. Patient will likely need systemic chemotherapy and continuation of anticoagulation.  #6 nausea vomiting diarrhea abdominal pain Abdominal pain likely due to pancreatic cancer. No further diarrhea. Resumed Senokot-S and home regimen sorbitol. IV fluids. Pain management. Supportive care. Patient on a regular diet. Monitor.   #7 moderate protein calorie malnutrition Nutritional supplementation.  #8 depression/anxiety Continued on home regimen Zoloft. Ativan as needed.  #9  leukocytosis Likely secondary to problem #1. CT chest negative for infiltrate. Patient also received a dose of IV Solu-Medrol prior to admission while receiving his chemotherapy due to hypersensitivity reaction. Patient with no urinary symptoms. WBC trending back up likely secondary to GSF which patient was started on per oncology. Outpatient follow-up.   Procedures:  CT angiogram chest 11/12/2015  Chest x-ray 11/12/2015  2-D echo 11/13/2015  Lower extremity Dopplers 11/13/2015  IVC venogram and IVC filter placement 11/13/2015. Per Dr. Kathlene Cote.  Consultations:  PCCM: Georgann Housekeeper, NP 11/13/2015  Oncology/hematology: Dr. Alvy Bimler 11/13/2015  Discharge Exam: Filed Vitals:   11/17/15 0507 11/17/15 1335  BP: 130/81 129/78  Pulse: 95 87  Temp: 98.6 F (37 C) 98.4 F (36.9 C)  Resp: 16 18    General: NAD Cardiovascular: RRR Respiratory: CTAB  Discharge Instructions   Discharge Instructions    Diet general    Complete by:  As directed      Discharge instructions    Complete by:  As directed   Follow up with Dr Alvy Bimler as scheduled. 11/18/2015.     Increase activity slowly    Complete by:  As directed           Current Discharge Medication List    START taking these medications   Details  feeding supplement (BOOST / RESOURCE BREEZE) LIQD Take 1 Container by mouth 2 (two) times daily between meals. Refills: 0      CONTINUE these medications which have CHANGED   Details  ALPRAZolam (XANAX) 0.5 MG tablet Take 1 tablet (0.5 mg total) by mouth 3 (three) times daily as needed for anxiety (try first dose now if patient thinks he needs it. thank you). Qty: 15 tablet,  Refills: 0    sertraline (ZOLOFT) 50 MG tablet Take 1 tablet (50 mg total) by mouth 2 (two) times daily. Qty: 30 tablet, Refills: 3      CONTINUE these medications which have NOT CHANGED   Details  oxyCODONE 10 MG TABS Take 1 tablet (10 mg total) by mouth every 3 (three) hours as needed for moderate  pain, severe pain or breakthrough pain. Qty: 60 tablet, Refills: 0    oxyCODONE 30 MG 12 hr tablet Take 30 mg by mouth every 12 (twelve) hours. Qty: 60 tablet, Refills: 0    prochlorperazine (COMPAZINE) 10 MG tablet Take 1 tablet (10 mg total) by mouth every 6 (six) hours as needed (NAUSEA). 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 Qty: 42 tablet, Refills: 0    senna-docusate (SENOKOT-S) 8.6-50 MG tablet Take 2 tablets by mouth 2 (two) times daily. Qty: 120 tablet, Refills: 0    sorbitol 70 % SOLN Take 40 mLs by mouth 2 (two) times daily. Qty: 473 mL, Refills: 1    ondansetron (ZOFRAN) 8 MG tablet Take 1 tablet (8 mg total) by mouth every 8 (eight) hours as needed for nausea, vomiting or refractory nausea / vomiting. 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 On Day 22, switch to one 264mtablet once a day with food. Qty: 51 each, Refills: 0      STOP taking these medications     lidocaine-prilocaine (EMLA) cream      rivaroxaban (XARELTO) KIT        No Known Allergies Follow-up Information    [facility] On 11/18/2015.   Specialty:  Hematology and Oncology   Why:  f/u at 1230pm   Contact information:   [phone]      The results of significant diagnostics from this hospitalization (including imaging, microbiology, ancillary and laboratory) are listed below for reference.    Significant Diagnostic Studies: Dg Chest 2 View  11/12/2015  CLINICAL DATA:  Pancreatic cancer.  Chest pain. EXAM: CHEST  2 VIEW COMPARISON:  CT chest 10/30/2015  FINDINGS: There is a right-sided Port-A-Cath in satisfactory position. There is mild lingular atelectasis. The lungs are otherwise clear. There is no pleural effusion or pneumothorax. The heart and mediastinal contours are unremarkable. The osseous structures are unremarkable. IMPRESSION: No active cardiopulmonary disease. Electronically Signed   By: HeKathreen Devoid On: 11/12/2015 20:41   Dg Ankle Complete Left  10/30/2015  CLINICAL DATA:  Left ankle pain for 3 days.  No known injury. EXAM: LEFT ANKLE COMPLETE - 3+ VIEW COMPARISON:  None. FINDINGS: There is no evidence of fracture, dislocation, or joint effusion. Minimal degenerative spurring is seen along the inferior aspect of the medial malleolus in the anterior margin of the distal tibia. No evidence of joint space narrowing. Soft tissues are unremarkable. IMPRESSION: No acute findings.  Mild degenerative spurring. Electronically Signed   By: JoEarle Gell.D.   On: 10/30/2015 12:20   Ct Chest W Contrast  10/29/2015  CLINICAL DATA:  Chest  pain for 1 day EXAM: CT CHEST WITH CONTRAST TECHNIQUE: Multidetector CT imaging of the chest was performed during intravenous contrast administration. CONTRAST:  59m ISOVUE-300 IOPAMIDOL (ISOVUE-300) INJECTION 61% COMPARISON:  None FINDINGS: The lungs are well aerated bilaterally. No focal infiltrate or sizable effusion is seen. No focal parenchymal nodule is noted. No sizable effusion or pneumothorax is noted. The thoracic inlet is within normal limits. The thoracic aorta and its branches are unremarkable. Pulmonary artery is incompletely evaluated due to timing of the contrast bolus. No significant hilar or mediastinal adenopathy is noted. Scanning into the upper abdomen demonstrates multiple hypodense lesions throughout the liver consistent with the patient's given clinical history of metastatic pancreas carcinoma to the liver. These are stable from a CT from the previous day. A few small hypodensities are also noted  within the spleen also suggestive of metastatic disease. Persistent mass in the tail of the pancreas is noted. These changes were better evaluated on the recent CT examination of the abdomen and pelvis. No acute bony abnormality is seen. IMPRESSION: Changes consistent with pancreatic mass and metastatic disease as seen on the previous CT examination. The timing of the contrast bolus limits evaluation for potential pulmonary embolus. When compared with the exam from the previous day, however there are findings suggestive of pulmonary embolism. No other intrathoracic abnormality is noted. These results were called by telephone at the time of interpretation on 10/29/2015 at 6:21 pm to [facility], who verbally acknowledged these results. Electronically Signed   By: MInez CatalinaM.D.   On: 10/29/2015 18:21   Ct Angio Chest Pe W/cm &/or Wo Cm  11/12/2015  ADDENDUM REPORT: 11/12/2015 23:02 ADDENDUM: Please note the study was compared to the chest CT dated 10/30/2015 Overall there has been interval progression of the scratch the pulmonary emboli on today's study compared to prior study. For example there is new pulmonary embolus in the right middle lobe segmental branch (series 4, image 44) not present on the prior study. Right upper lobe pulmonary artery embolus also appears more progressed compared the prior study. Evaluation of today's study is however somewhat limited due to respiratory motion artifact. These results were called by telephone at the time of interpretation on 11/12/2015 at 11:02 pm to Dr. NDavonna Belling, who verbally acknowledged these results. Electronically Signed   By: AAnner CreteM.D.   On: 11/12/2015 23:02  11/12/2015  CLINICAL DATA:  Follow up 45year old male with chest pain. History of cancer. Concern for PE. EXAM: CT ANGIOGRAPHY CHEST WITH CONTRAST TECHNIQUE: Multidetector CT imaging of the chest was performed using the standard protocol during bolus administration of  intravenous contrast. Multiplanar CT image reconstructions and MIPs were obtained to evaluate the vascular anatomy. CONTRAST:  100 cc Isovue 370 COMPARISON:  Chest radiograph dated 11/12/2015 and chest CT dated 10/30/2006 FINDINGS: There are linear bibasilar atelectasis/ scarring. There is mild centrilobular emphysema. The lungs are clear. There is no pleural effusion or pneumothorax. The central airways are patent. The thoracic aorta and the origins of the great vessels of the aortic arch appear patent. Evaluation of this exam is limited due to respiratory motion artifact. There are filling defects within the segmental and subsegmental branches of the right middle and right lower lobes. Right upper lobe segmental and subsegmental filling defects are also noted. There are filling defects within the left upper and left lower lobe pulmonary artery vasculature extending from the segmental vessels distally. There is mild dilatation of the right ventricle with retrograde  flow of contrast into the IVC concerning for a degree of right cardiac straining. Correlation with clinical exam and echocardiogram recommended. There is mild cardiomegaly. No pericardial effusion. Subcarinal lymph node measures 1.4 cm in short axis. No definite hilar adenopathy. The esophagus and the thyroid gland appear grossly unremarkable. There is no axillary adenopathy. Right pectoral Port-A-Cath with tip at the cavoatrial junction. The chest wall soft tissues appear unremarkable. The osseous structures are intact. There are multiple hepatic hypodense lesions measuring up to 4.2 x 4.1 cm in the dome of the liver most compatible with metastatic disease. Review of the MIP images confirms the above findings. IMPRESSION: Extensive bilateral pulmonary artery emboli with findings concerning for a degree of right cardiac straining. Correlation with clinical exam and echocardiogram recommended. Mildly enlarged subcarinal lymph node measuring 1.4 cm short  axis. Extensive hepatic metastatic disease. Electronically Signed: By: Anner Crete M.D. On: 11/12/2015 22:51   Ct Angio Chest Pe W/cm &/or Wo Cm  10/30/2015  CLINICAL DATA:  45 year old male with new imaging diagnosis of metastatic pancreatic carcinoma, 10/28/2015. Chest CT 10/29/2015 suggests pulmonary embolism. EXAM: CT ANGIOGRAPHY CHEST WITH CONTRAST TECHNIQUE: Multidetector CT imaging of the chest was performed using the standard protocol during bolus administration of intravenous contrast. Multiplanar CT image reconstructions and MIPs were obtained to evaluate the vascular anatomy. CONTRAST:  100 cc Isovue 370 COMPARISON:  Abdominal CT 10/28/2015, chest CT 10/29/2015 FINDINGS: Chest: Unremarkable appearance of the chest superficial soft tissues. No axillary or supraclavicular adenopathy. Unremarkable appearance of the thoracic inlet, including the visualized thyroid. Mediastinal lymph nodes are present, including AP window, paratracheal, and sub carinal. None of these are significantly enlarged. Pericardial lymph nodes in the drainage pathway of the liver Unremarkable appearance of the esophagus. Unremarkable course caliber and contour of the thoracic aorta without dissection flap, aneurysm, periaortic fluid. Left-sided filling defects involving segmental and subsegmental branches of the left lower lobe. Subsegmental filling defects of the right lower lobe. Associated atelectatic changes, without consolidation. Ratio of right ventricle to left ventricle measures less than 1. No pleural effusion. No pneumothorax. No confluent airspace disease. Upper abdomen: Multiple metastatic foci of the liver again noted. Partially visualized mass involving the tail of the pancreas along the greater curvature of the stomach, incompletely imaged. Musculoskeletal: No displaced fracture. Review of the MIP images confirms the above findings. IMPRESSION: Study is positive for pulmonary emboli, involving left lower lobe  segmental and subsegmental branches, as well as subsegmental branches of the right lower lobe. No evidence of right-sided heart strain. Re- demonstration of imaging pattern compatible with metastatic pancreatic carcinoma, better characterized on prior CT abdomen. Suspicious lymph nodes in the pericardial nodal stations. These results were called by telephone at the time of interpretation on 10/30/2015 at 9:18 am to the nurse caring for the patient, Ms. Larose Kells, who verbally acknowledged these results. Signed, Dulcy Fanny. Earleen Newport, DO [facility] Electronically Signed   By: Corrie Mckusick D.O.   On: 10/30/2015 09:20   Ct Abdomen Pelvis W Contrast  10/28/2015  CLINICAL DATA:  Epigastric pain for 1 month. EXAM: CT ABDOMEN AND PELVIS WITH CONTRAST TECHNIQUE: Multidetector CT imaging of the abdomen and pelvis was performed using the standard protocol following bolus administration of intravenous contrast. CONTRAST:  100 cc ISOVUE-300 IOPAMIDOL (ISOVUE-300) INJECTION 61% COMPARISON:  None. FINDINGS: Lower chest:  Normal. Hepatobiliary: Innumerable metastatic lesions throughout the liver. The dominant lesions are a 4.4 cm lesion in the dome of the right lobe  and a 5.5 cm lesion in the lateral aspect. The lesions involve all segments of the liver. No biliary ductal dilatation. Pancreas: There is a inhomogeneous 6.1 x 4.0 x 5.4 cm tumor in the tail of the pancreas invading the posterior wall of the stomach. The head and body of the pancreas are normal. Spleen: Ill-defined 19 mm lesion in the anterior aspect of the spleen. There are 2 smaller lesions in the spleen. These are worrisome for metastatic disease. Adrenals/Urinary Tract: There are several vague areas of low-density in each kidney, worrisome for metastatic disease. Benign appearing 25 mm cyst on the lower pole of the left kidney. No hydronephrosis. The bladder is normal. Stomach/Bowel: The bowel is normal  including the terminal ileum and appendix except for a tiny fecalith in the appendix. There is a small midline anterior abdominal wall hernia through the linea alba containing omentum including some omental vessels. This hernia as above the umbilicus. Vascular/Lymphatic: Normal. Reproductive: Normal. Other: No free air or free fluid. Musculoskeletal: Normal. IMPRESSION: Metastatic pancreatic carcinoma. There are metastases to the liver, spleen and both kidneys. Anterior abdominal wall hernia containing omentum. Electronically Signed   By: Lorriane Shire M.D.   On: 10/28/2015 14:15   Ir Ivc Filter Plmt / S&i /img Guid/mod Sed  11/13/2015  CLINICAL DATA:  Stage IV metastatic pancreatic carcinoma with recent hospitalization for pulmonary embolism and DVT. Progressive bilateral pulmonary emboli despite anticoagulation with persistent residual thrombus in both lower extremity calf veins. The patient requires IVC filter placement. EXAM: 1. ULTRASOUND GUIDANCE FOR VASCULAR ACCESS OF THE RIGHT INTERNAL JUGULAR VEIN. 2. IVC VENOGRAM. 3. PERCUTANEOUS IVC FILTER PLACEMENT. ANESTHESIA/SEDATION: 2.0 mg IV Versed; 100 mcg IV Fentanyl. Total Moderate Sedation Time 18 minutes. The patient's level of consciousness and physiologic status were continuously monitored during the procedure by Radiology nursing. CONTRAST:  11m ISOVUE-300 IOPAMIDOL (ISOVUE-300) INJECTION 61%, 247mISOVUE-300 IOPAMIDOL (ISOVUE-300) INJECTION 61% FLUOROSCOPY TIME:  1 minutes and 42 seconds. PROCEDURE: The procedure, risks, benefits, and alternatives were explained to the patient. Questions regarding the procedure were encouraged and answered. The patient understands and consents to the procedure. A time-out was performed prior to initiating the procedure. The right neck was prepped with chlorhexidine in a sterile fashion, and a sterile drape was applied covering the operative field. A sterile gown and sterile gloves were used for the procedure. Local  anesthesia was provided with 1% Lidocaine. Ultrasound was used to confirm patency of the right internal jugular vein. Under direct ultrasound guidance, a 21 gauge needle was advanced into the right internal jugular vein with ultrasound image documentation performed. After securing access with a micropuncture dilator, a guidewire was advanced into the inferior vena cava. A deployment sheath was advanced over the guidewire. This was utilized to perform IVC venography. The deployment sheath was further positioned in an appropriate location for filter deployment. A Bard Denali IVC filter was then advanced in the sheath. This was then fully deployed in the infrarenal IVC. Final filter position was confirmed with a fluoroscopic spot image. After the procedure the sheath was removed and hemostasis obtained with manual compression. COMPLICATIONS: None. FINDINGS: IVC venography demonstrates a normal caliber IVC with no evidence of thrombus. Renal veins are identified bilaterally. The IVC filter was successfully positioned below the level of the renal veins and is appropriately oriented. This IVC filter has both permanent and retrievable indications. IMPRESSION: Placement of percutaneous IVC filter in infrarenal IVC. IVC venogram shows no evidence of IVC thrombus and normal caliber of the  inferior vena cava. This filter does have both permanent and retrievable indications. Due to patient related comorbidities, history of metastatic malignancy and clinical necessity, this IVC filter should be considered a permanent device. This patient will not be actively followed for future filter retrieval. Electronically Signed   By: Aletta Edouard M.D.   On: 11/13/2015 17:28   US Biopsy  11/01/2015  INDICATION: 45 year old with pancreatic lesion and multiple liver lesions. Tissue diagnosis is needed. EXAM: ULTRASOUND-GUIDED LIVER LESION BIOPSY MEDICATIONS: None. ANESTHESIA/SEDATION: Moderate (conscious) sedation was employed during  this procedure. A total of Versed 4.0 mg and Fentanyl 100 mcg was administered intravenously. Moderate Sedation Time: 23 minutes. The patient's level of consciousness and vital signs were monitored continuously by radiology nursing throughout the procedure under my direct supervision. FLUOROSCOPY TIME:  None COMPLICATIONS: None immediate. PROCEDURE: The procedure was explained to the patient. The risks and benefits of the procedure were discussed and the patient's questions were addressed. Informed consent was obtained from the patient. Liver was evaluated with ultrasound. Patient has known multiple lesions throughout the liver but these are poorly characterized with ultrasound. Most lesions are vague hyperechoic areas. A relatively well-defined nodular lesion in the right hepatic lobe was targeted. The right side of the abdomen was prepped with chlorhexidine and a sterile field was created. The skin and soft tissues were anesthetized with 1% lidocaine. 17 gauge needle directed into the lesion. A total of 3 core biopsies were obtained with an 18 gauge device. Specimens were placed in formalin. Gel-Foam was placed through the 17 gauge needle during removal. Bandage placed over the puncture site. Ultrasound images were taken and saved for this procedure. FINDINGS: Vague hyperechoic areas throughout the liver consistent with known lesions. Relatively well-defined 1 cm lesion in the right hepatic lobe just below the capsule was sampled. No significant bleeding following the core biopsies. Biopsy needle was confirmed within this lesion during the procedure. IMPRESSION: Ultrasound-guided core biopsies of a right hepatic lesion. Electronically Signed   By: Markus Daft M.D.   On: 11/01/2015 08:11   Ir Fluoro Guide Cv Line Right  11/04/2015  CLINICAL DATA:  Metastatic pancreas cancer EXAM: RIGHT INTERNAL JUGULAR SINGLE LUMEN POWER PORT CATHETER INSERTION Date:  5/8/20175/01/2016 12:29 pm Radiologist:  M. Daryll Brod, MD  Guidance:  Ultrasound and fluoroscopic MEDICATIONS: 2 g Ancef; The antibiotic was administered within an appropriate time interval prior to skin puncture. ANESTHESIA/SEDATION: Versed 1.5 mg IV; Fentanyl 75 mcg IV; Moderate Sedation Time:  35 minutes The patient was continuously monitored during the procedure by the interventional radiology nurse under my direct supervision. FLUOROSCOPY TIME:  30 seconds (2 mGy) COMPLICATIONS: None immediate. CONTRAST:  None. PROCEDURE: Informed consent was obtained from the patient following explanation of the procedure, risks, benefits and alternatives. The patient understands, agrees and consents for the procedure. All questions were addressed. A time out was performed. Maximal barrier sterile technique utilized including caps, mask, sterile gowns, sterile gloves, large sterile drape, hand hygiene, and 2% chlorhexidine scrub. Under sterile conditions and local anesthesia, right internal jugular micropuncture venous access was performed. Access was performed with ultrasound. Images were obtained for documentation of the patent right internal jugular vein. A guide wire was inserted followed by a transitional dilator. This allowed insertion of a guide wire and catheter into the IVC. Measurements were obtained from the SVC / RA junction back to the right IJ venotomy site. In the right infraclavicular chest, a subcutaneous pocket was created over the second anterior rib. This was  done under sterile conditions and local anesthesia. 1% lidocaine with epinephrine was utilized for this. A 2.5 cm incision was made in the skin. Blunt dissection was performed to create a subcutaneous pocket over the right pectoralis major muscle. The pocket was flushed with saline vigorously. There was adequate hemostasis. The port catheter was assembled and checked for leakage. The port catheter was secured in the pocket with two retention sutures. The tubing was tunneled subcutaneously to the right venotomy  site and inserted into the SVC/RA junction through a valved peel-away sheath. Position was confirmed with fluoroscopy. Images were obtained for documentation. The patient tolerated the procedure well. No immediate complications. Incisions were closed in a two layer fashion with 4 - 0 Vicryl suture. Dermabond was applied to the skin. The port catheter was accessed, blood was aspirated followed by saline and heparin flushes. Needle was removed. A dry sterile dressing was applied. IMPRESSION: Ultrasound and fluoroscopically guided right internal jugular single lumen power port catheter insertion. Tip in the SVC/RA junction. Catheter ready for use. Electronically Signed   By: Jerilynn Mages.  Shick M.D.   On: 11/04/2015 12:55   Ir US Guide Vasc Access Right  11/04/2015  CLINICAL DATA:  Metastatic pancreas cancer EXAM: RIGHT INTERNAL JUGULAR SINGLE LUMEN POWER PORT CATHETER INSERTION Date:  5/8/20175/01/2016 12:29 pm Radiologist:  M. Daryll Brod, MD Guidance:  Ultrasound and fluoroscopic MEDICATIONS: 2 g Ancef; The antibiotic was administered within an appropriate time interval prior to skin puncture. ANESTHESIA/SEDATION: Versed 1.5 mg IV; Fentanyl 75 mcg IV; Moderate Sedation Time:  35 minutes The patient was continuously monitored during the procedure by the interventional radiology nurse under my direct supervision. FLUOROSCOPY TIME:  30 seconds (2 mGy) COMPLICATIONS: None immediate. CONTRAST:  None. PROCEDURE: Informed consent was obtained from the patient following explanation of the procedure, risks, benefits and alternatives. The patient understands, agrees and consents for the procedure. All questions were addressed. A time out was performed. Maximal barrier sterile technique utilized including caps, mask, sterile gowns, sterile gloves, large sterile drape, hand hygiene, and 2% chlorhexidine scrub. Under sterile conditions and local anesthesia, right internal jugular micropuncture venous access was performed. Access was  performed with ultrasound. Images were obtained for documentation of the patent right internal jugular vein. A guide wire was inserted followed by a transitional dilator. This allowed insertion of a guide wire and catheter into the IVC. Measurements were obtained from the SVC / RA junction back to the right IJ venotomy site. In the right infraclavicular chest, a subcutaneous pocket was created over the second anterior rib. This was done under sterile conditions and local anesthesia. 1% lidocaine with epinephrine was utilized for this. A 2.5 cm incision was made in the skin. Blunt dissection was performed to create a subcutaneous pocket over the right pectoralis major muscle. The pocket was flushed with saline vigorously. There was adequate hemostasis. The port catheter was assembled and checked for leakage. The port catheter was secured in the pocket with two retention sutures. The tubing was tunneled subcutaneously to the right venotomy site and inserted into the SVC/RA junction through a valved peel-away sheath. Position was confirmed with fluoroscopy. Images were obtained for documentation. The patient tolerated the procedure well. No immediate complications. Incisions were closed in a two layer fashion with 4 - 0 Vicryl suture. Dermabond was applied to the skin. The port catheter was accessed, blood was aspirated followed by saline and heparin flushes. Needle was removed. A dry sterile dressing was applied. IMPRESSION: Ultrasound and fluoroscopically guided right  internal jugular single lumen power port catheter insertion. Tip in the SVC/RA junction. Catheter ready for use. Electronically Signed   By: Jerilynn Mages.  Shick M.D.   On: 11/04/2015 12:55   Dg Abd Portable 1v  11/03/2015  CLINICAL DATA:  Right upper quadrant pain and constipation. Pancreatic carcinoma. EXAM: PORTABLE ABDOMEN - 1 VIEW COMPARISON:  CT abdomen and pelvis Oct 28, 2015 FINDINGS: There is moderate stool throughout the colon. There is no bowel  dilatation or air-fluid level suggesting obstruction. No free air. Small phlebolith in left pelvis. IMPRESSION: No bowel obstruction or free air. Moderate stool throughout colon. Colon does not appear distended with stool. Electronically Signed   By: Lowella Grip III M.D.   On: 11/03/2015 10:13   Dg Foot Complete Left  10/30/2015  CLINICAL DATA:  Left foot pain for 3 days.  No known injury. EXAM: LEFT FOOT - COMPLETE 3+ VIEW COMPARISON:  None. FINDINGS: There is no evidence of fracture or dislocation. There is no evidence of arthropathy or other focal bone abnormality. IMPRESSION: Negative. Electronically Signed   By: Earle Gell M.D.   On: 10/30/2015 12:24   Dg Colon W/water Sol Cm  11/06/2015  CLINICAL DATA:  Metastatic pancreatic cancer.  Severe constipation. EXAM: COLON WITH WATER SOLUTION CONTRAST COMPARISON:  CT scan 10/28/2015. FINDINGS: Initial scout image demonstrates a large amount of air throughout the colon. There is also some stool in the right and transverse colon. Possible colonic ileus. No intrinsic or extrinsic lesions of the colon are identified. Mild to moderate dilatation of the ascending and transverse colon. IMPRESSION: Moderate stool in a dilated right and transverse colon. Air throughout the rest of the colon. No obstruction or mass. Electronically Signed   By: Marijo Sanes M.D.   On: 11/06/2015 15:42    Microbiology: Recent Results (from the past 240 hour(s))  MRSA PCR Screening     Status: None   Collection Time: 11/13/15 12:25 AM  Result Value Ref Range Status   MRSA by PCR NEGATIVE NEGATIVE Final    Comment:        The GeneXpert MRSA Assay (FDA approved for NASAL specimens only), is one component of a comprehensive MRSA colonization surveillance program. It is not intended to diagnose MRSA infection nor to guide or monitor treatment for MRSA infections.      Labs: Basic Metabolic Panel:  Recent Labs Lab 11/13/15 0130 11/13/15 0816 11/14/15 0435  11/15/15 0615 11/16/15 0432  NA 132* 134* 133* 133* 134*  K 4.4 4.6 4.8 4.1 4.0  CL 100* 101 99* 101 103  CO2 _0 GLUCOSE 121* 110* 98 96 94  BUN _1 CREATININE 0.73 0.74 0.85 0.76 0.96  CALCIUM 9.2 9.4 9.3 9.0 8.9  MG  --   --  2.2  --   --    Liver Function Tests:  Recent Labs Lab 11/12/15 2011 11/13/15 0130 11/14/15 0435  AST 42* 35 45*  ALT 44 39 46  ALKPHOS 399* 367* 374*  BILITOT 1.5* 1.7* 1.8*  PROT 7.7 7.2 6.9  ALBUMIN 2.9* 2.8* 2.7*    Recent Labs Lab 11/12/15 2011  LIPASE 79*   No results for input(s): AMMONIA in the last 168 hours. CBC:  Recent Labs Lab 11/13/15 0130 11/14/15 0435 11/15/15 0615 11/16/15 0432 11/17/15 0346  WBC 14.8* 12.4* 34.4* 37.3* 41.6*  HGB 10.2* 10.2* 9.4* 9.2* 8.9*  HCT 31.2* 31.4* 28.5* 28.1* 26.4*  MCV 81.7 82.4 80.5  82.6 80.0  PLT 189 214 233 122* 100*   Cardiac Enzymes:  Recent Labs Lab 11/12/15 2011 11/13/15 0816 11/13/15 1350  TROPONINI 0.03 0.03 0.03   BNP: BNP (last 3 results) No results for input(s): BNP in the last 8760 hours.  ProBNP (last 3 results) No results for input(s): PROBNP in the last 8760 hours.  CBG:  Recent Labs Lab 11/13/15 0734 11/14/15 0743 11/15/15 0742 11/16/15 0750 11/17/15 0756  GLUCAP 97 92 101* 87 88       Signed:  THOMPSON,DANIEL MD.  [facility] 11/17/2015, 3:45 PM

## 2015-11-17 NOTE — Progress Notes (Signed)
Patient discharged to home, all discharge medications and instructions reviewed and questions answered.  Patient to be assisted to vehicle by wheelchair.  

## 2015-11-18 ENCOUNTER — Ambulatory Visit: Payer: Self-pay

## 2015-11-18 ENCOUNTER — Other Ambulatory Visit (HOSPITAL_BASED_OUTPATIENT_CLINIC_OR_DEPARTMENT_OTHER): Payer: Self-pay

## 2015-11-18 ENCOUNTER — Ambulatory Visit (HOSPITAL_BASED_OUTPATIENT_CLINIC_OR_DEPARTMENT_OTHER): Payer: Self-pay | Admitting: Hematology and Oncology

## 2015-11-18 ENCOUNTER — Telehealth: Payer: Self-pay | Admitting: Hematology and Oncology

## 2015-11-18 ENCOUNTER — Encounter: Payer: Self-pay | Admitting: Hematology and Oncology

## 2015-11-18 VITALS — BP 123/88 | HR 101 | Temp 98.1°F | Resp 20 | Ht 69.0 in | Wt 181.4 lb

## 2015-11-18 DIAGNOSIS — E440 Moderate protein-calorie malnutrition: Secondary | ICD-10-CM

## 2015-11-18 DIAGNOSIS — C259 Malignant neoplasm of pancreas, unspecified: Secondary | ICD-10-CM

## 2015-11-18 DIAGNOSIS — G893 Neoplasm related pain (acute) (chronic): Secondary | ICD-10-CM

## 2015-11-18 DIAGNOSIS — C787 Secondary malignant neoplasm of liver and intrahepatic bile duct: Secondary | ICD-10-CM

## 2015-11-18 DIAGNOSIS — I2699 Other pulmonary embolism without acute cor pulmonale: Secondary | ICD-10-CM

## 2015-11-18 LAB — CBC WITH DIFFERENTIAL/PLATELET
BASO%: 0.2 % (ref 0.0–2.0)
BASOS ABS: 0.1 10*3/uL (ref 0.0–0.1)
EOS ABS: 0.2 10*3/uL (ref 0.0–0.5)
EOS%: 0.5 % (ref 0.0–7.0)
HCT: 31.5 % — ABNORMAL LOW (ref 38.4–49.9)
HEMOGLOBIN: 10 g/dL — AB (ref 13.0–17.1)
LYMPH%: 5.3 % — ABNORMAL LOW (ref 14.0–49.0)
MCH: 25.7 pg — AB (ref 27.2–33.4)
MCHC: 31.6 g/dL — ABNORMAL LOW (ref 32.0–36.0)
MCV: 81.1 fL (ref 79.3–98.0)
MONO#: 1.9 10*3/uL — AB (ref 0.1–0.9)
MONO%: 4.7 % (ref 0.0–14.0)
NEUT%: 89.3 % — ABNORMAL HIGH (ref 39.0–75.0)
NEUTROS ABS: 35 10*3/uL — AB (ref 1.5–6.5)
PLATELETS: 146 10*3/uL (ref 140–400)
RBC: 3.88 10*6/uL — ABNORMAL LOW (ref 4.20–5.82)
RDW: 15.4 % — AB (ref 11.0–14.6)
WBC: 39.2 10*3/uL — AB (ref 4.0–10.3)
lymph#: 2.1 10*3/uL (ref 0.9–3.3)

## 2015-11-18 LAB — COMPREHENSIVE METABOLIC PANEL
ALBUMIN: 2.7 g/dL — AB (ref 3.5–5.0)
ALK PHOS: 413 U/L — AB (ref 40–150)
ALT: 34 U/L (ref 0–55)
ANION GAP: 10 meq/L (ref 3–11)
AST: 34 U/L (ref 5–34)
BILIRUBIN TOTAL: 0.91 mg/dL (ref 0.20–1.20)
BUN: 9.8 mg/dL (ref 7.0–26.0)
CALCIUM: 9.5 mg/dL (ref 8.4–10.4)
CO2: 22 mEq/L (ref 22–29)
CREATININE: 0.9 mg/dL (ref 0.7–1.3)
Chloride: 103 mEq/L (ref 98–109)
Glucose: 116 mg/dl (ref 70–140)
Potassium: 3.7 mEq/L (ref 3.5–5.1)
Sodium: 136 mEq/L (ref 136–145)
TOTAL PROTEIN: 7 g/dL (ref 6.4–8.3)

## 2015-11-18 NOTE — Assessment & Plan Note (Signed)
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 Continue Xarelto indefinitely.

## 2015-11-18 NOTE — Assessment & Plan Note (Signed)
He has lost a lot of weight with poor oral intake due to cancer diagnosis. I reinforced the importance of frequent small meals.

## 2015-11-18 NOTE — Assessment & Plan Note (Signed)
He had received cycle 1 of treatment. Continue supportive care. He missed Neulasta injection last week and I have given him several doses of G-CSF while he was hospitalized. He does not need G-CSF today. I recommend the patient returns on Wednesday and Friday for repeat blood work. If his [place] is low, he will get G-CSF. If he feels dehydrated/nauseated, he will get IV fluids

## 2015-11-18 NOTE — Progress Notes (Signed)
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 There are metastases to the liver, spleen and both kidneys   10/30/2015 Tumor Marker CA 19-9 was elevated at [id number]   10/30/2015 Imaging US venous Doppler are consistent with acute deep vein thrombosis involving the right peroneal vein, left posterial tibial vein, and left  peroneal vein.  Acute superficial vein thrombosis involving the left small saphenous vein.   10/30/2015 Imaging CT angiogram is positive for pulmonary emboli, involving left lower lobe segmental and subsegmental branches, as well as subsegmental branches of the right lower lobe   10/31/2015 Pathology Results Accession: [id number] liver biopsy was positive for adenocarcinoma consistent with metastatic pancreatic cancer.   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 He is seen for further follow-up. He continues to have chronic epigastric pain, rated pain at 6 out of 10 pain. Denies chest pain or shortness of breath. He denies constipation. Had some nausea this  morning related to sorbitol. He requested other form of laxative. The patient denies any recent signs or symptoms of bleeding such as spontaneous epistaxis, hematuria or hematochezia. Denies recent infection. REVIEW OF SYSTEMS:   Constitutional: Denies fevers, chills or abnormal weight loss Eyes: Denies blurriness of vision Ears, nose, mouth, throat, and face: Denies mucositis or sore throat Respiratory: Denies cough, dyspnea or wheezes Cardiovascular: Denies palpitation, chest discomfort or lower extremity swelling Skin: Denies abnormal skin rashes Lymphatics: Denies new lymphadenopathy or easy bruising Neurological:Denies numbness, tingling or new weaknesses Behavioral/Psych: Mood is stable, no new changes  All other systems were reviewed with the patient and are negative.  I have reviewed the past medical history, past surgical history, social history and family history with the patient and they are unchanged from previous note.  ALLERGIES:  has No Known Allergies.  MEDICATIONS:  Current Outpatient Prescriptions  Medication Sig Dispense Refill  . ALPRAZolam (XANAX) 0.5 MG tablet Take 1 tablet (0.5 mg total) by mouth 3 (three) times daily as needed for anxiety (try first dose now if patient thinks he needs it. thank you). 15 tablet 0  . feeding supplement (BOOST / RESOURCE BREEZE) LIQD Take 1 Container by mouth 2 (two) times daily between meals.  0  . ondansetron (ZOFRAN) 8 MG tablet Take 1 tablet (8 mg total) by mouth every 8 (eight) hours as needed for nausea, vomiting or refractory nausea / vomiting. 30 tablet 1  . oxyCODONE 10 MG TABS Take 1 tablet (10 mg total) by mouth every 3 (three) hours as needed for moderate pain, severe pain or  breakthrough pain. 60 tablet 0  . oxyCODONE 30 MG 12 hr tablet Take 30 mg by mouth every 12 (twelve) hours. 60 tablet 0  . prochlorperazine (COMPAZINE) 10 MG tablet Take 1 tablet (10 mg total) by mouth every 6 (six) hours as needed (NAUSEA). 30 tablet  1  . Rivaroxaban (XARELTO) 15 MG TABS tablet Take 1 tablet (15 mg total) by mouth 2 (two) times daily with a meal. 42 tablet 0  . senna-docusate (SENOKOT-S) 8.6-50 MG tablet Take 2 tablets by mouth 2 (two) times daily. 120 tablet 0  . sertraline (ZOLOFT) 50 MG tablet Take 1 tablet (50 mg total) by mouth 2 (two) times daily. 30 tablet 3  . sorbitol 70 % SOLN Take 40 mLs by mouth 2 (two) times daily. 473 mL 1  . Rivaroxaban (XARELTO STARTER PACK) 15 & 20 MG TBPK Take as directed on package: Start with one 15mg  tablet by mouth twice a day with food. On Day 22, switch to one 20mg  tablet once a day with food. (Patient not taking: Reported on 11/18/2015) 51 each 0   No current facility-administered medications for this visit.    PHYSICAL EXAMINATION: ECOG PERFORMANCE STATUS: 1 - Symptomatic but completely ambulatory  Filed Vitals:   11/18/15 1314  BP: 123/88  Pulse: 101  Temp: 98.1 F (36.7 C)  Resp: 20   Filed Weights   11/18/15 1314  Weight: 181 lb 6.4 oz (82.283 kg)    GENERAL:alert, no distress and comfortable SKIN: skin color, texture, turgor are normal, no rashes or significant lesions EYES: normal, Conjunctiva are pink and non-injected, sclera clear Musculoskeletal:no cyanosis of digits and no clubbing  NEURO: alert & oriented x 3 with fluent speech, no focal motor/sensory deficits  LABORATORY DATA:  I have reviewed the data as listed    Component Value Date/Time   NA 136 11/18/2015 1237   NA 134* 11/16/2015 0432   K 3.7 11/18/2015 1237   K 4.0 11/16/2015 0432   CL 103 11/16/2015 0432   CO2 22 11/18/2015 1237   CO2 25 11/16/2015 0432   GLUCOSE 116 11/18/2015 1237   GLUCOSE 94 11/16/2015 0432   BUN 9.8 11/18/2015 1237   BUN 12 11/16/2015 0432   CREATININE 0.9 11/18/2015 1237   CREATININE 0.96 11/16/2015 0432   CALCIUM 9.5 11/18/2015 1237   CALCIUM 8.9 11/16/2015 0432   PROT 7.0 11/18/2015 1237   PROT 6.9 11/14/2015 0435   ALBUMIN 2.7* 11/18/2015 1237   ALBUMIN  2.7* 11/14/2015 0435   AST 34 11/18/2015 1237   AST 45* 11/14/2015 0435   ALT 34 11/18/2015 1237   ALT 46 11/14/2015 0435   ALKPHOS 413* 11/18/2015 1237   ALKPHOS 374* 11/14/2015 0435   BILITOT 0.91 11/18/2015 1237   BILITOT 1.8* 11/14/2015 0435   GFRNONAA >60 11/16/2015 0432   GFRAA >60 11/16/2015 0432    No results found for: SPEP, UPEP  Lab Results  Component Value Date   WBC 39.2* 11/18/2015   NEUTROABS 35.0* 11/18/2015   HGB 10.0* 11/18/2015   HCT 31.5* 11/18/2015   MCV 81.1 11/18/2015   PLT 146 11/18/2015      Chemistry      Component Value Date/Time   NA 136 11/18/2015 1237   NA 134* 11/16/2015 0432   K 3.7 11/18/2015 1237   K 4.0 11/16/2015 0432   CL 103 11/16/2015 0432   CO2 22 11/18/2015 1237   CO2 25 11/16/2015 0432   BUN 9.8 11/18/2015 1237   BUN  12 11/16/2015 0432   CREATININE 0.9 11/18/2015 1237   CREATININE 0.96 11/16/2015 0432      Component Value Date/Time   CALCIUM 9.5 11/18/2015 1237   CALCIUM 8.9 11/16/2015 0432   ALKPHOS 413* 11/18/2015 1237   ALKPHOS 374* 11/14/2015 0435   AST 34 11/18/2015 1237   AST 45* 11/14/2015 0435   ALT 34 11/18/2015 1237   ALT 46 11/14/2015 0435   BILITOT 0.91 11/18/2015 1237   BILITOT 1.8* 11/14/2015 0435     ASSESSMENT & PLAN:  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 Continue supportive care. He missed Neulasta injection last week and I have given him several doses of G-CSF while he was hospitalized. He does not need G-CSF today. I recommend the patient returns on Wednesday and Friday for repeat blood work. If his [place] is low, he will get G-CSF. If he feels dehydrated/nauseated, he will get IV fluids  Cancer associated pain He is currently taking combination therapy with extended release pain medicine and immediate release oxycodone for breakthrough pain medicine. Continue the same.    Acute pulmonary embolism ([place]) He had recurrent PE status post IVC  filter placement. Continue Xarelto indefinitely.  Protein-calorie malnutrition, moderate ([place]) He has lost a lot of weight with poor oral intake due to cancer diagnosis. I reinforced the importance of frequent small meals.    No orders of the defined types were placed in this encounter.   All questions were answered. The patient knows to call the clinic with any problems, questions or concerns. No barriers to learning was detected. I spent 15 minutes counseling the patient face to face. The total time spent in the appointment was 20 minutes and more than 50% was on counseling and review of test results     [facility], Jontae Sonier, MD 11/18/2015 1:49 PM

## 2015-11-18 NOTE — Telephone Encounter (Signed)
Gave and printed appt shced and avs for pt for May and JUNE °

## 2015-11-18 NOTE — Assessment & Plan Note (Signed)
He is currently taking combination therapy with extended release pain medicine and immediate release oxycodone for breakthrough pain medicine. Continue the same.

## 2015-11-19 ENCOUNTER — Other Ambulatory Visit: Payer: Self-pay | Admitting: *Deleted

## 2015-11-20 ENCOUNTER — Other Ambulatory Visit: Payer: Self-pay | Admitting: Hematology and Oncology

## 2015-11-20 ENCOUNTER — Other Ambulatory Visit: Payer: Self-pay | Admitting: *Deleted

## 2015-11-20 ENCOUNTER — Ambulatory Visit (HOSPITAL_BASED_OUTPATIENT_CLINIC_OR_DEPARTMENT_OTHER): Payer: Self-pay

## 2015-11-20 ENCOUNTER — Other Ambulatory Visit (HOSPITAL_BASED_OUTPATIENT_CLINIC_OR_DEPARTMENT_OTHER): Payer: Self-pay

## 2015-11-20 ENCOUNTER — Ambulatory Visit: Payer: Self-pay

## 2015-11-20 VITALS — BP 134/88 | HR 102 | Temp 98.1°F | Resp 18

## 2015-11-20 DIAGNOSIS — C787 Secondary malignant neoplasm of liver and intrahepatic bile duct: Secondary | ICD-10-CM

## 2015-11-20 DIAGNOSIS — Z95828 Presence of other vascular implants and grafts: Secondary | ICD-10-CM

## 2015-11-20 DIAGNOSIS — C259 Malignant neoplasm of pancreas, unspecified: Secondary | ICD-10-CM

## 2015-11-20 LAB — CBC WITH DIFFERENTIAL/PLATELET
BASO%: 0.5 % (ref 0.0–2.0)
Basophils Absolute: 0.1 10*3/uL (ref 0.0–0.1)
EOS%: 0.7 % (ref 0.0–7.0)
Eosinophils Absolute: 0.1 10*3/uL (ref 0.0–0.5)
HEMATOCRIT: 31.2 % — AB (ref 38.4–49.9)
HGB: 10 g/dL — ABNORMAL LOW (ref 13.0–17.1)
LYMPH#: 1.3 10*3/uL (ref 0.9–3.3)
LYMPH%: 10.1 % — ABNORMAL LOW (ref 14.0–49.0)
MCH: 26 pg — ABNORMAL LOW (ref 27.2–33.4)
MCHC: 32.2 g/dL (ref 32.0–36.0)
MCV: 80.8 fL (ref 79.3–98.0)
MONO#: 1.4 10*3/uL — AB (ref 0.1–0.9)
MONO%: 10.5 % (ref 0.0–14.0)
NEUT%: 78.2 % — ABNORMAL HIGH (ref 39.0–75.0)
NEUTROS ABS: 10.2 10*3/uL — AB (ref 1.5–6.5)
PLATELETS: 160 10*3/uL (ref 140–400)
RBC: 3.86 10*6/uL — ABNORMAL LOW (ref 4.20–5.82)
RDW: 15.4 % — AB (ref 11.0–14.6)
WBC: 13.1 10*3/uL — AB (ref 4.0–10.3)

## 2015-11-20 LAB — COMPREHENSIVE METABOLIC PANEL
ALT: 32 U/L (ref 0–55)
ANION GAP: 11 meq/L (ref 3–11)
AST: 34 U/L (ref 5–34)
Albumin: 2.8 g/dL — ABNORMAL LOW (ref 3.5–5.0)
Alkaline Phosphatase: 468 U/L — ABNORMAL HIGH (ref 40–150)
BILIRUBIN TOTAL: 0.89 mg/dL (ref 0.20–1.20)
BUN: 12.9 mg/dL (ref 7.0–26.0)
CALCIUM: 9.4 mg/dL (ref 8.4–10.4)
CHLORIDE: 102 meq/L (ref 98–109)
CO2: 23 meq/L (ref 22–29)
CREATININE: 0.9 mg/dL (ref 0.7–1.3)
EGFR: >90 mL/min/{1.73_m2} (ref >90)
Glucose: 127 mg/dl (ref 70–140)
Potassium: 3.5 mEq/L (ref 3.5–5.1)
Sodium: 136 mEq/L (ref 136–145)
TOTAL PROTEIN: 7.1 g/dL (ref 6.4–8.3)

## 2015-11-20 MED ORDER — SODIUM CHLORIDE 0.9 % IV SOLN
Freq: Once | INTRAVENOUS | Status: AC
  Administered 2015-11-20: 10:00:00 via INTRAVENOUS
  Filled 2015-11-20: qty 4

## 2015-11-20 MED ORDER — SODIUM CHLORIDE 0.9% FLUSH
10.0000 mL | INTRAVENOUS | Status: DC | PRN
  Administered 2015-11-20: 10 mL via INTRAVENOUS
  Filled 2015-11-20: qty 10

## 2015-11-20 MED ORDER — HYDROMORPHONE HCL 4 MG/ML IJ SOLN
4.0000 mg | INTRAMUSCULAR | Status: DC | PRN
  Administered 2015-11-20: 4 mg via INTRAVENOUS

## 2015-11-20 MED ORDER — SODIUM CHLORIDE 0.9 % IV SOLN
1000.0000 mL | Freq: Once | INTRAVENOUS | Status: AC
Start: 2015-11-20 — End: 2015-11-20
  Administered 2015-11-20: 1000 mL via INTRAVENOUS

## 2015-11-20 MED ORDER — LORAZEPAM 2 MG/ML IJ SOLN
INTRAMUSCULAR | Status: AC
  Filled 2015-11-20: qty 1

## 2015-11-20 MED ORDER — LORAZEPAM 1 MG PO TABS: 1.0000 mg | ORAL_TABLET | Freq: Four times a day (QID) | ORAL | Status: DC | PRN

## 2015-11-20 MED ORDER — SODIUM CHLORIDE 0.9% FLUSH
10.0000 mL | INTRAVENOUS | Status: AC | PRN
  Administered 2015-11-20: 10 mL
  Filled 2015-11-20: qty 10

## 2015-11-20 MED ORDER — LORAZEPAM 2 MG/ML IJ SOLN
1.0000 mg | Freq: Once | INTRAMUSCULAR | Status: AC
  Administered 2015-11-20: 1 mg via INTRAVENOUS

## 2015-11-20 MED ORDER — HYDROMORPHONE HCL 4 MG/ML IJ SOLN
INTRAMUSCULAR | Status: AC
  Filled 2015-11-20: qty 1

## 2015-11-20 MED ORDER — HEPARIN SOD (PORK) LOCK FLUSH 100 UNIT/ML IV SOLN
500.0000 [IU] | INTRAVENOUS | Status: AC | PRN
  Administered 2015-11-20: 500 [IU]
  Filled 2015-11-20: qty 5

## 2015-11-20 MED ORDER — OXYCODONE HCL 10 MG PO TABS: 10.0000 mg | ORAL_TABLET | ORAL | Status: DC | PRN

## 2015-11-20 NOTE — Patient Instructions (Signed)

## 2015-11-20 NOTE — Patient Instructions (Signed)

## 2015-11-20 NOTE — Progress Notes (Signed)
To be done in Infusion room.

## 2015-11-22 ENCOUNTER — Ambulatory Visit (HOSPITAL_BASED_OUTPATIENT_CLINIC_OR_DEPARTMENT_OTHER): Payer: Self-pay

## 2015-11-22 ENCOUNTER — Other Ambulatory Visit (HOSPITAL_BASED_OUTPATIENT_CLINIC_OR_DEPARTMENT_OTHER): Payer: Self-pay

## 2015-11-22 ENCOUNTER — Ambulatory Visit: Payer: Self-pay

## 2015-11-22 VITALS — BP 139/97 | HR 83 | Temp 98.1°F | Resp 19

## 2015-11-22 DIAGNOSIS — C787 Secondary malignant neoplasm of liver and intrahepatic bile duct: Principal | ICD-10-CM

## 2015-11-22 DIAGNOSIS — C259 Malignant neoplasm of pancreas, unspecified: Secondary | ICD-10-CM

## 2015-11-22 DIAGNOSIS — R109 Unspecified abdominal pain: Secondary | ICD-10-CM

## 2015-11-22 LAB — CBC WITH DIFFERENTIAL/PLATELET
BASO%: 0.5 % (ref 0.0–2.0)
BASOS ABS: 0.1 10*3/uL (ref 0.0–0.1)
EOS ABS: 0.1 10*3/uL (ref 0.0–0.5)
EOS%: 1.3 % (ref 0.0–7.0)
HEMATOCRIT: 29.4 % — AB (ref 38.4–49.9)
HEMOGLOBIN: 9.6 g/dL — AB (ref 13.0–17.1)
LYMPH#: 1.6 10*3/uL (ref 0.9–3.3)
LYMPH%: 14.9 % (ref 14.0–49.0)
MCH: 26.4 pg — AB (ref 27.2–33.4)
MCHC: 32.7 g/dL (ref 32.0–36.0)
MCV: 80.8 fL (ref 79.3–98.0)
MONO#: 1 10*3/uL — AB (ref 0.1–0.9)
MONO%: 9.2 % (ref 0.0–14.0)
NEUT%: 74.1 % (ref 39.0–75.0)
NEUTROS ABS: 7.9 10*3/uL — AB (ref 1.5–6.5)
PLATELETS: 144 10*3/uL (ref 140–400)
RBC: 3.64 10*6/uL — ABNORMAL LOW (ref 4.20–5.82)
RDW: 15.3 % — AB (ref 11.0–14.6)
WBC: 10.7 10*3/uL — AB (ref 4.0–10.3)

## 2015-11-22 LAB — COMPREHENSIVE METABOLIC PANEL
ALBUMIN: 2.7 g/dL — AB (ref 3.5–5.0)
ALT: 52 U/L (ref 0–55)
AST: 62 U/L — AB (ref 5–34)
Alkaline Phosphatase: 518 U/L — ABNORMAL HIGH (ref 40–150)
Anion Gap: 11 mEq/L (ref 3–11)
BUN: 10 mg/dL (ref 7.0–26.0)
CHLORIDE: 102 meq/L (ref 98–109)
CO2: 23 mEq/L (ref 22–29)
CREATININE: 0.8 mg/dL (ref 0.7–1.3)
Calcium: 9.3 mg/dL (ref 8.4–10.4)
EGFR: >90 mL/min/{1.73_m2} (ref >90)
GLUCOSE: 110 mg/dL (ref 70–140)
POTASSIUM: 3.9 meq/L (ref 3.5–5.1)
SODIUM: 137 meq/L (ref 136–145)
Total Bilirubin: 1.12 mg/dL (ref 0.20–1.20)
Total Protein: 7.1 g/dL (ref 6.4–8.3)

## 2015-11-22 MED ORDER — HYDROMORPHONE HCL 4 MG/ML IJ SOLN
INTRAMUSCULAR | Status: AC
  Filled 2015-11-22: qty 1

## 2015-11-22 MED ORDER — ONDANSETRON HCL 40 MG/20ML IJ SOLN
Freq: Once | INTRAMUSCULAR | Status: AC
  Administered 2015-11-22: 10:00:00 via INTRAVENOUS
  Filled 2015-11-22: qty 4

## 2015-11-22 MED ORDER — ALTEPLASE 2 MG IJ SOLR
2.0000 mg | Freq: Once | INTRAMUSCULAR | Status: DC | PRN
  Filled 2015-11-22: qty 2

## 2015-11-22 MED ORDER — SODIUM CHLORIDE 0.9 % IJ SOLN
10.0000 mL | INTRAMUSCULAR | Status: DC | PRN
  Administered 2015-11-22: 10 mL
  Filled 2015-11-22: qty 10

## 2015-11-22 MED ORDER — SODIUM CHLORIDE 0.9 % IV SOLN
Freq: Once | INTRAVENOUS | Status: AC
  Administered 2015-11-22: 10:00:00 via INTRAVENOUS

## 2015-11-22 MED ORDER — HYDROMORPHONE HCL 4 MG/ML IJ SOLN
4.0000 mg | INTRAMUSCULAR | Status: DC | PRN
  Administered 2015-11-22: 4 mg via INTRAVENOUS

## 2015-11-22 MED ORDER — LORAZEPAM 1 MG PO TABS
ORAL_TABLET | ORAL | Status: AC
  Filled 2015-11-22: qty 1

## 2015-11-22 MED ORDER — HEPARIN SOD (PORK) LOCK FLUSH 100 UNIT/ML IV SOLN
500.0000 [IU] | Freq: Once | INTRAVENOUS | Status: AC | PRN
  Administered 2015-11-22: 500 [IU]
  Filled 2015-11-22: qty 5

## 2015-11-22 MED ORDER — LORAZEPAM 1 MG PO TABS
1.0000 mg | ORAL_TABLET | Freq: Four times a day (QID) | ORAL | Status: DC | PRN
  Administered 2015-11-22: 1 mg via ORAL

## 2015-11-22 NOTE — Patient Instructions (Signed)

## 2015-11-22 NOTE — Progress Notes (Signed)
To be completed in Infusion room. 

## 2015-11-22 NOTE — Progress Notes (Signed)
C/o mid abdominal pain , rated @ 8. Unable to sleep at night for more than an hour d/t pain.  Staes no BM in 2 days. C/o nausea but vomiting. Diminished appetite. Given IV Dilaudid , SL Ativan and IV zofran.   Within 30 mins pain has decreased to 2-3 and pt states he is feeling better. Denies nausea.  Pain level remains at acceptable level for patient @ 2-3.  Advised to try Xanax @ ns to help with sleep issues. Also advised to to keep track of how much breakthrough pain meds he uses as his long acting pain meds may need to be adjusted to keep pain under control.  Discussed use of senna in the am and pm to avoid constipation.  Pt voiced understanding to all instructions.  Did not need Granix today with ANC 7.9

## 2015-11-26 ENCOUNTER — Telehealth: Payer: Self-pay | Admitting: *Deleted

## 2015-11-26 NOTE — Telephone Encounter (Signed)
S/w [facility] Pharmacist regarding oxycodone Rxs for pt..  Apparently they filled the Oxycodone 10 mg tablets for pt but not the Oxycontin 30 mg tablets since they were written by different MDs.   Informed pharmacist if pt presents Rx today for the 30 mg extended release oxycodone they may fill it,  Otherwise will give pt new Rx on tomorrow's visit.  Then it will be same MD on all his pain meds.  Pharmacist will make a note of it.

## 2015-11-26 NOTE — Telephone Encounter (Signed)
LVM for pt's mother (no answer and no VM on pt's own phone).   I received note from Call Center that the pharmacy needed some verification on pt's pain medication because he had two Rxs one for oxycodone 30 mg BID (oxycontin) and one for oxycodone 10 mg q 3 hrs.   There was no note on outcome of that call.   I left message for pt's mother to call us back if there was any problem in pt getting his medication.  If not, then just keep appt as scheduled tomorrow.

## 2015-11-27 ENCOUNTER — Telehealth: Payer: Self-pay | Admitting: *Deleted

## 2015-11-27 ENCOUNTER — Telehealth: Payer: Self-pay | Admitting: Hematology and Oncology

## 2015-11-27 ENCOUNTER — Ambulatory Visit (HOSPITAL_BASED_OUTPATIENT_CLINIC_OR_DEPARTMENT_OTHER): Payer: Self-pay | Admitting: Hematology and Oncology

## 2015-11-27 ENCOUNTER — Ambulatory Visit: Payer: Self-pay

## 2015-11-27 ENCOUNTER — Ambulatory Visit (HOSPITAL_BASED_OUTPATIENT_CLINIC_OR_DEPARTMENT_OTHER): Payer: Self-pay

## 2015-11-27 ENCOUNTER — Other Ambulatory Visit (HOSPITAL_BASED_OUTPATIENT_CLINIC_OR_DEPARTMENT_OTHER): Payer: Self-pay

## 2015-11-27 ENCOUNTER — Other Ambulatory Visit: Payer: Self-pay | Admitting: Hematology and Oncology

## 2015-11-27 ENCOUNTER — Encounter: Payer: Self-pay | Admitting: Hematology and Oncology

## 2015-11-27 VITALS — BP 127/87 | HR 120 | Temp 97.7°F | Resp 18 | Ht 69.0 in | Wt 177.4 lb

## 2015-11-27 VITALS — BP 123/88 | HR 91 | Resp 16

## 2015-11-27 DIAGNOSIS — C787 Secondary malignant neoplasm of liver and intrahepatic bile duct: Principal | ICD-10-CM

## 2015-11-27 DIAGNOSIS — C259 Malignant neoplasm of pancreas, unspecified: Secondary | ICD-10-CM

## 2015-11-27 DIAGNOSIS — K5903 Drug induced constipation: Secondary | ICD-10-CM

## 2015-11-27 DIAGNOSIS — C252 Malignant neoplasm of tail of pancreas: Secondary | ICD-10-CM

## 2015-11-27 DIAGNOSIS — D61810 Antineoplastic chemotherapy induced pancytopenia: Secondary | ICD-10-CM | POA: Insufficient documentation

## 2015-11-27 DIAGNOSIS — T402X5A Adverse effect of other opioids, initial encounter: Secondary | ICD-10-CM

## 2015-11-27 DIAGNOSIS — R634 Abnormal weight loss: Secondary | ICD-10-CM

## 2015-11-27 DIAGNOSIS — G893 Neoplasm related pain (acute) (chronic): Secondary | ICD-10-CM

## 2015-11-27 DIAGNOSIS — T451X5A Adverse effect of antineoplastic and immunosuppressive drugs, initial encounter: Secondary | ICD-10-CM

## 2015-11-27 DIAGNOSIS — R748 Abnormal levels of other serum enzymes: Secondary | ICD-10-CM | POA: Insufficient documentation

## 2015-11-27 DIAGNOSIS — I2609 Other pulmonary embolism with acute cor pulmonale: Secondary | ICD-10-CM

## 2015-11-27 DIAGNOSIS — E440 Moderate protein-calorie malnutrition: Secondary | ICD-10-CM

## 2015-11-27 LAB — CBC WITH DIFFERENTIAL/PLATELET
BASO%: 0.3 % (ref 0.0–2.0)
Basophils Absolute: 0 10*3/uL (ref 0.0–0.1)
EOS%: 1.5 % (ref 0.0–7.0)
Eosinophils Absolute: 0.2 10*3/uL (ref 0.0–0.5)
HEMATOCRIT: 29.4 % — AB (ref 38.4–49.9)
HGB: 9.4 g/dL — ABNORMAL LOW (ref 13.0–17.1)
LYMPH%: 12 % — AB (ref 14.0–49.0)
MCH: 25.9 pg — AB (ref 27.2–33.4)
MCHC: 32 g/dL (ref 32.0–36.0)
MCV: 81 fL (ref 79.3–98.0)
MONO#: 1.4 10*3/uL — AB (ref 0.1–0.9)
MONO%: 11 % (ref 0.0–14.0)
NEUT%: 75.2 % — AB (ref 39.0–75.0)
NEUTROS ABS: 9.3 10*3/uL — AB (ref 1.5–6.5)
PLATELETS: 68 10*3/uL — AB (ref 140–400)
RBC: 3.63 10*6/uL — AB (ref 4.20–5.82)
RDW: 15.6 % — ABNORMAL HIGH (ref 11.0–14.6)
WBC: 12.3 10*3/uL — AB (ref 4.0–10.3)
lymph#: 1.5 10*3/uL (ref 0.9–3.3)
nRBC: 0 % (ref 0–0)

## 2015-11-27 LAB — COMPREHENSIVE METABOLIC PANEL
ALT: 46 U/L (ref 0–55)
ANION GAP: 12 meq/L — AB (ref 3–11)
AST: 66 U/L — AB (ref 5–34)
Albumin: 2.7 g/dL — ABNORMAL LOW (ref 3.5–5.0)
Alkaline Phosphatase: 488 U/L — ABNORMAL HIGH (ref 40–150)
BUN: 10.1 mg/dL (ref 7.0–26.0)
CALCIUM: 9.6 mg/dL (ref 8.4–10.4)
CHLORIDE: 97 meq/L — AB (ref 98–109)
CO2: 26 meq/L (ref 22–29)
Creatinine: 0.8 mg/dL (ref 0.7–1.3)
EGFR: >90 mL/min/{1.73_m2} (ref >90)
Glucose: 112 mg/dl (ref 70–140)
POTASSIUM: 4.4 meq/L (ref 3.5–5.1)
Sodium: 134 mEq/L — ABNORMAL LOW (ref 136–145)
Total Bilirubin: 1.58 mg/dL — ABNORMAL HIGH (ref 0.20–1.20)
Total Protein: 7.4 g/dL (ref 6.4–8.3)

## 2015-11-27 MED ORDER — SODIUM CHLORIDE 0.9 % IJ SOLN
10.0000 mL | INTRAMUSCULAR | Status: DC | PRN
  Administered 2015-11-27: 10 mL
  Filled 2015-11-27: qty 10

## 2015-11-27 MED ORDER — PEGFILGRASTIM INJECTION 6 MG/0.6ML: 6.0000 mg | Freq: Once | SUBCUTANEOUS | Status: DC

## 2015-11-27 MED ORDER — SODIUM CHLORIDE 0.9 % IV SOLN
Freq: Once | INTRAVENOUS | Status: AC
  Administered 2015-11-27: 09:00:00 via INTRAVENOUS

## 2015-11-27 MED ORDER — HYDROMORPHONE HCL 4 MG/ML IJ SOLN
4.0000 mg | INTRAMUSCULAR | Status: DC | PRN
  Administered 2015-11-27: 4 mg via INTRAVENOUS

## 2015-11-27 MED ORDER — HYDROMORPHONE HCL 4 MG/ML IJ SOLN
INTRAMUSCULAR | Status: AC
  Filled 2015-11-27: qty 1

## 2015-11-27 MED ORDER — HEPARIN SOD (PORK) LOCK FLUSH 100 UNIT/ML IV SOLN
500.0000 [IU] | Freq: Once | INTRAVENOUS | Status: AC | PRN
  Administered 2015-11-27: 500 [IU]
  Filled 2015-11-27: qty 5

## 2015-11-27 MED ORDER — SODIUM CHLORIDE 0.9 % IV SOLN
Freq: Once | INTRAVENOUS | Status: DC
  Filled 2015-11-27: qty 4

## 2015-11-27 MED ORDER — SODIUM CHLORIDE 0.9 % IJ SOLN
10.0000 mL | INTRAMUSCULAR | Status: AC | PRN
  Administered 2015-11-27: 10 mL
  Filled 2015-11-27: qty 10

## 2015-11-27 NOTE — Assessment & Plan Note (Signed)
Unfortunately, he has significant thrombocytopenia from previous treatment. I'm also concerned whether the thrombocytopenia could be due to worsening liver disease. In any case, it is not safe to proceed with cycle 2 of chemotherapy and I will reschedule that to start next week. I will reduce the dosage of the wrist different chemotherapy to avoid worsening pancytopenia with future treatment.

## 2015-11-27 NOTE — Telephone Encounter (Signed)
Per staff message and POF I have scheduled appts. Advised scheduler of appts. JMW  

## 2015-11-27 NOTE — Patient Instructions (Signed)

## 2015-11-27 NOTE — Progress Notes (Signed)
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 There are metastases to the liver, spleen and both kidneys   10/30/2015 Tumor Marker CA 19-9 was elevated at [id number]   10/30/2015 Imaging US venous Doppler are consistent with acute deep vein thrombosis involving the right peroneal vein, left posterial tibial vein, and left  peroneal vein.  Acute superficial vein thrombosis involving the left small saphenous vein.   10/30/2015 Imaging CT angiogram is positive for pulmonary emboli, involving left lower lobe segmental and subsegmental branches, as well as subsegmental branches of the right lower lobe   10/31/2015 Pathology Results Accession: [id number] liver biopsy was positive for adenocarcinoma consistent with metastatic pancreatic cancer.   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  REVIEW OF SYSTEMS:   Constitutional: Denies fevers, chills or abnormal weight loss Eyes: Denies blurriness of vision Ears, nose, mouth, throat, and face: Denies mucositis or sore throat Respiratory:  Denies cough, dyspnea or wheezes Cardiovascular: Denies palpitation, chest discomfort or lower extremity swelling Gastrointestinal:  Denies nausea, heartburn or change in bowel habits Skin: Denies abnormal skin rashes Lymphatics: Denies new lymphadenopathy or easy bruising Neurological:Denies numbness, tingling or new weaknesses Behavioral/Psych: Mood is stable, no new changes  All other systems were reviewed with the patient and are negative.  I have reviewed the past medical history, past surgical history, social history and family history with the patient and they are unchanged from previous note.  ALLERGIES:  has No Known Allergies.  MEDICATIONS:  Current Outpatient Prescriptions  Medication Sig Dispense Refill  . ALPRAZolam (XANAX) 0.5 MG tablet Take 1 tablet (0.5 mg total) by mouth 3 (three) times daily as needed for anxiety (try first dose now if patient thinks he needs it. thank you). 15 tablet 0  . ondansetron (ZOFRAN) 8 MG tablet Take 1 tablet (8 mg total) by mouth every 8 (eight) hours as needed for nausea, vomiting or refractory nausea / vomiting. 30 tablet 1  . oxyCODONE 30 MG 12 hr tablet Take 30 mg by mouth every 12 (twelve) hours. 60 tablet 0  . Oxycodone HCl 10 MG TABS Take 1 tablet (10 mg total) by mouth every 3 (three) hours as needed. 90 tablet 0  . prochlorperazine (COMPAZINE) 10 MG tablet Take 1 tablet (10 mg total) by mouth every 6 (six) hours as needed (NAUSEA). 30 tablet 1  . Rivaroxaban (XARELTO STARTER PACK) 15 & 20 MG TBPK Take as directed on package: Start with one 15mg  tablet by mouth twice a day with food. On Day 22, switch to  one 20mg  tablet once a day with food. 51 each 0  . Rivaroxaban (XARELTO) 15 MG TABS tablet Take 1 tablet (15 mg total) by mouth 2 (two) times daily with a meal. 42 tablet 0  . senna-docusate (SENOKOT-S) 8.6-50 MG tablet Take 2 tablets by mouth 2 (two) times daily. 120 tablet 0  . sertraline (ZOLOFT) 50 MG tablet Take 1 tablet (50 mg total)  by mouth 2 (two) times daily. 30 tablet 3  . sorbitol 70 % SOLN Take 40 mLs by mouth 2 (two) times daily. 473 mL 1  . feeding supplement (BOOST / RESOURCE BREEZE) LIQD Take 1 Container by mouth 2 (two) times daily between meals. (Patient not taking: Reported on 11/27/2015)  0   No current facility-administered medications for this visit.   Facility-Administered Medications Ordered in Other Visits  Medication Dose Route Frequency Provider Last Rate Last Dose  . heparin lock flush 100 unit/mL  500 Units Intracatheter Once PRN Heath Lark, MD      . HYDROmorphone (DILAUDID) injection 4 mg  4 mg Intravenous Q2H PRN Antigone Crowell, MD      . ondansetron (ZOFRAN) 8 mg in sodium chloride 0.9 % 50 mL IVPB   Intravenous Once Heath Lark, MD      . pegfilgrastim (NEULASTA) injection 6 mg  6 mg Subcutaneous Once Heath Lark, MD      . sodium chloride 0.9 % injection 10 mL  10 mL Intracatheter PRN Heath Lark, MD        PHYSICAL EXAMINATION: ECOG PERFORMANCE STATUS: 2 - Symptomatic, <50% confined to bed  Filed Vitals:   11/27/15 0850  BP: 127/87  Pulse: 120  Temp: 97.7 F (36.5 C)  Resp: 18   Filed Weights   11/27/15 0850  Weight: 177 lb 6.4 oz (80.468 kg)    GENERAL:alert,With mild distress from pain and appears uncomfortable SKIN: skin color, texture, turgor are normal, no rashes or significant lesions EYES: normal, Conjunctiva are pink and non-injected, sclera clear OROPHARYNX:no exudate, no erythema and lips, buccal mucosa, and tongue normal  NECK: supple, thyroid normal size, non-tender, without nodularity LYMPH:  no palpable lymphadenopathy in the cervical, axillary or inguinal LUNGS: clear to auscultation and percussion with normal breathing effort HEART: regular rate, mild tachycardia and no murmurs and no lower extremity edema ABDOMEN:abdomen soft, non-tender and normal bowel sounds. He has epigastric discomfort Musculoskeletal:no cyanosis of digits and no clubbing  NEURO: alert &  oriented x 3 with fluent speech, no focal motor/sensory deficits  LABORATORY DATA:  I have reviewed the data as listed    Component Value Date/Time   NA 134* 11/27/2015 0829   NA 134* 11/16/2015 0432   K 4.4 11/27/2015 0829   K 4.0 11/16/2015 0432   CL 103 11/16/2015 0432   CO2 26 11/27/2015 0829   CO2 25 11/16/2015 0432   GLUCOSE 112 11/27/2015 0829   GLUCOSE 94 11/16/2015 0432   BUN 10.1 11/27/2015 0829   BUN 12 11/16/2015 0432   CREATININE 0.8 11/27/2015 0829   CREATININE 0.96 11/16/2015 0432   CALCIUM 9.6 11/27/2015 0829   CALCIUM 8.9 11/16/2015 0432   PROT 7.4 11/27/2015 0829   PROT 6.9 11/14/2015 0435   ALBUMIN 2.7* 11/27/2015 0829   ALBUMIN 2.7* 11/14/2015 0435   AST 66* 11/27/2015 0829   AST 45* 11/14/2015 0435   ALT 46 11/27/2015 0829   ALT 46 11/14/2015 0435   ALKPHOS 488* 11/27/2015 0829   ALKPHOS 374* 11/14/2015 0435   BILITOT 1.58*  11/27/2015 0829   BILITOT 1.8* 11/14/2015 0435   GFRNONAA >60 11/16/2015 0432   GFRAA >60 11/16/2015 0432    No results found for: SPEP, UPEP  Lab Results  Component Value Date   WBC 12.3* 11/27/2015   NEUTROABS 9.3* 11/27/2015   HGB 9.4* 11/27/2015   HCT 29.4* 11/27/2015   MCV 81.0 11/27/2015   PLT 68* 11/27/2015      Chemistry      Component Value Date/Time   NA 134* 11/27/2015 0829   NA 134* 11/16/2015 0432   K 4.4 11/27/2015 0829   K 4.0 11/16/2015 0432   CL 103 11/16/2015 0432   CO2 26 11/27/2015 0829   CO2 25 11/16/2015 0432   BUN 10.1 11/27/2015 0829   BUN 12 11/16/2015 0432   CREATININE 0.8 11/27/2015 0829   CREATININE 0.96 11/16/2015 0432      Component Value Date/Time   CALCIUM 9.6 11/27/2015 0829   CALCIUM 8.9 11/16/2015 0432   ALKPHOS 488* 11/27/2015 0829   ALKPHOS 374* 11/14/2015 0435   AST 66* 11/27/2015 0829   AST 45* 11/14/2015 0435   ALT 46 11/27/2015 0829   ALT 46 11/14/2015 0435   BILITOT 1.58* 11/27/2015 0829   BILITOT 1.8* 11/14/2015 0435       ASSESSMENT & PLAN:  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) Unfortunately, he has significant thrombocytopenia from previous treatment. I'm also concerned whether the thrombocytopenia could be due to worsening liver disease. In any case, it is not safe to proceed with cycle 2 of chemotherapy and I will reschedule that to start next week. I will reduce the dosage of the wrist different chemotherapy to avoid worsening pancytopenia with future treatment.  Pancytopenia due to antineoplastic chemotherapy [facility]) This is likely due to recent treatment. The patient denies recent history of bleeding such as epistaxis, hematuria or hematochezia. He is asymptomatic from the low platelet count. I will observe for now.  I plan to hold his chemotherapy and rescheduled to next week. I plan to reduce various different chemotherapy doses I will get his blood count rechecked again 2 days time in case the thrombocytopenia gets worse, I might have to hold his anticoagulation therapy.  Pulmonary embolism with acute cor pulmonale (HCC) The patient denies any recent signs or symptoms of bleeding such as spontaneous epistaxis, hematuria or hematochezia. With his low platelet count, he is at risk of bleeding I plan to recheck platelet count in 2 days' time and a platelet count drop less than 50,000, I might have to stop his treatment.  Cancer associated pain His pain remained poorly controlled. I recommend increasing the dose of extended release OxyContin to 3 times a day and reassess his pain control in the future  Constipation due to opioid therapy He has experienced significant constipation related to narcotic prescription. I reinforced the importance of taking regular laxatives. I plan to increase his laxatives to Senokot 2 tablets 3 times a day along with Miralax daily.  Weight loss He has poor oral intake due to nausea, cancer related cachexia and others. The patient appeared dehydrated and I will give him IV fluids today. I  emphasized importance of regular small meals and good control of nausea  Protein-calorie malnutrition, moderate (HCC) He has lost a lot of weight with poor oral intake due to cancer diagnosis. I reinforced the importance of frequent small meals.     Elevated liver enzymes He has worsening liver enzymes that could be related to his disease progression  in the liver versus recent chemotherapy side-effects. I will continue to observe closely and consider dose adjustment to his chemotherapy in the near future.   No orders of the defined types were placed in this encounter.   All questions were answered. The patient knows to call the clinic with any problems, questions or concerns. No barriers to learning was detected. I spent 30 minutes counseling the patient face to face. The total time spent in the appointment was 40 minutes and more than 50% was on counseling and review of test results     [facility], [place], MD 11/27/2015 9:30 AM

## 2015-11-27 NOTE — Patient Instructions (Signed)

## 2015-11-27 NOTE — Assessment & Plan Note (Signed)
The patient denies any recent signs or symptoms of bleeding such as spontaneous epistaxis, hematuria or hematochezia. With his low platelet count, he is at risk of bleeding I plan to recheck platelet count in 2 days' time and a platelet count drop less than 50,000, I might have to stop his treatment.

## 2015-11-27 NOTE — Assessment & Plan Note (Signed)
He has worsening liver enzymes that could be related to his disease progression in the liver versus recent chemotherapy side-effects. I will continue to observe closely and consider dose adjustment to his chemotherapy in the near future.

## 2015-11-27 NOTE — Assessment & Plan Note (Signed)
He has poor oral intake due to nausea, cancer related cachexia and others. The patient appeared dehydrated and I will give him IV fluids today. I emphasized importance of regular small meals and good control of nausea

## 2015-11-27 NOTE — Assessment & Plan Note (Signed)
He has lost a lot of weight with poor oral intake due to cancer diagnosis. I reinforced the importance of frequent small meals.

## 2015-11-27 NOTE — Assessment & Plan Note (Signed)
His pain remained poorly controlled. I recommend increasing the dose of extended release OxyContin to 3 times a day and reassess his pain control in the future

## 2015-11-27 NOTE — Assessment & Plan Note (Signed)
He has experienced significant constipation related to narcotic prescription. I reinforced the importance of taking regular laxatives. I plan to increase his laxatives to Senokot 2 tablets 3 times a day along with Miralax daily.

## 2015-11-27 NOTE — Telephone Encounter (Signed)
Gave and pritned appt sched and avs for pt for may and June

## 2015-11-27 NOTE — Assessment & Plan Note (Signed)
This is likely due to recent treatment. The patient denies recent history of bleeding such as epistaxis, hematuria or hematochezia. He is asymptomatic from the low platelet count. I will observe for now.  I plan to hold his chemotherapy and rescheduled to next week. I plan to reduce various different chemotherapy doses I will get his blood count rechecked again 2 days time in case the thrombocytopenia gets worse, I might have to hold his anticoagulation therapy.

## 2015-11-29 ENCOUNTER — Telehealth: Payer: Self-pay | Admitting: Hematology and Oncology

## 2015-11-29 ENCOUNTER — Other Ambulatory Visit (HOSPITAL_BASED_OUTPATIENT_CLINIC_OR_DEPARTMENT_OTHER): Payer: Self-pay

## 2015-11-29 ENCOUNTER — Other Ambulatory Visit: Payer: Self-pay | Admitting: *Deleted

## 2015-11-29 ENCOUNTER — Other Ambulatory Visit: Payer: Self-pay

## 2015-11-29 ENCOUNTER — Ambulatory Visit (HOSPITAL_BASED_OUTPATIENT_CLINIC_OR_DEPARTMENT_OTHER): Payer: Self-pay

## 2015-11-29 VITALS — BP 128/86 | HR 96 | Temp 98.4°F | Resp 16

## 2015-11-29 DIAGNOSIS — C259 Malignant neoplasm of pancreas, unspecified: Secondary | ICD-10-CM

## 2015-11-29 DIAGNOSIS — G893 Neoplasm related pain (acute) (chronic): Secondary | ICD-10-CM

## 2015-11-29 DIAGNOSIS — C787 Secondary malignant neoplasm of liver and intrahepatic bile duct: Principal | ICD-10-CM

## 2015-11-29 LAB — CBC WITH DIFFERENTIAL/PLATELET
BASO%: 1.3 % (ref 0.0–2.0)
BASOS ABS: 0.2 10*3/uL — AB (ref 0.0–0.1)
EOS ABS: 0.2 10*3/uL (ref 0.0–0.5)
EOS%: 1.5 % (ref 0.0–7.0)
HEMATOCRIT: 29.5 % — AB (ref 38.4–49.9)
HGB: 9.4 g/dL — ABNORMAL LOW (ref 13.0–17.1)
LYMPH#: 1.1 10*3/uL (ref 0.9–3.3)
LYMPH%: 8.4 % — ABNORMAL LOW (ref 14.0–49.0)
MCH: 25.4 pg — AB (ref 27.2–33.4)
MCHC: 31.7 g/dL — AB (ref 32.0–36.0)
MCV: 80.1 fL (ref 79.3–98.0)
MONO#: 1.6 10*3/uL — AB (ref 0.1–0.9)
MONO%: 11.9 % (ref 0.0–14.0)
NEUT#: 10.4 10*3/uL — ABNORMAL HIGH (ref 1.5–6.5)
NEUT%: 76.9 % — AB (ref 39.0–75.0)
PLATELETS: 76 10*3/uL — AB (ref 140–400)
RBC: 3.68 10*6/uL — ABNORMAL LOW (ref 4.20–5.82)
RDW: 16.4 % — ABNORMAL HIGH (ref 11.0–14.6)
WBC: 13.5 10*3/uL — ABNORMAL HIGH (ref 4.0–10.3)

## 2015-11-29 LAB — COMPREHENSIVE METABOLIC PANEL
ALT: 44 U/L (ref 0–55)
ANION GAP: 12 meq/L — AB (ref 3–11)
AST: 70 U/L — ABNORMAL HIGH (ref 5–34)
Albumin: 2.6 g/dL — ABNORMAL LOW (ref 3.5–5.0)
Alkaline Phosphatase: 528 U/L — ABNORMAL HIGH (ref 40–150)
BUN: 9.5 mg/dL (ref 7.0–26.0)
CALCIUM: 9.8 mg/dL (ref 8.4–10.4)
CHLORIDE: 98 meq/L (ref 98–109)
CO2: 23 mEq/L (ref 22–29)
CREATININE: 0.8 mg/dL (ref 0.7–1.3)
EGFR: >90 mL/min/{1.73_m2} (ref >90)
Glucose: 108 mg/dl (ref 70–140)
POTASSIUM: 4.4 meq/L (ref 3.5–5.1)
Sodium: 134 mEq/L — ABNORMAL LOW (ref 136–145)
Total Bilirubin: 1.95 mg/dL — ABNORMAL HIGH (ref 0.20–1.20)
Total Protein: 7.6 g/dL (ref 6.4–8.3)

## 2015-11-29 MED ORDER — SODIUM CHLORIDE 0.9 % IV SOLN
1000.0000 mL | Freq: Once | INTRAVENOUS | Status: AC
  Administered 2015-11-29: 1000 mL via INTRAVENOUS

## 2015-11-29 MED ORDER — SODIUM CHLORIDE 0.9 % IJ SOLN
10.0000 mL | INTRAMUSCULAR | Status: AC | PRN
  Administered 2015-11-29: 10 mL
  Filled 2015-11-29: qty 10

## 2015-11-29 MED ORDER — HYDROMORPHONE HCL 4 MG/ML IJ SOLN
INTRAMUSCULAR | Status: AC
  Filled 2015-11-29: qty 1

## 2015-11-29 MED ORDER — HYDROMORPHONE HCL 4 MG/ML IJ SOLN
4.0000 mg | INTRAMUSCULAR | Status: DC | PRN
  Administered 2015-11-29: 4 mg via INTRAVENOUS

## 2015-11-29 MED ORDER — HEPARIN SOD (PORK) LOCK FLUSH 100 UNIT/ML IV SOLN
500.0000 [IU] | INTRAVENOUS | Status: AC | PRN
  Administered 2015-11-29: 500 [IU]
  Filled 2015-11-29: qty 5

## 2015-11-29 NOTE — Patient Instructions (Signed)

## 2015-11-29 NOTE — Telephone Encounter (Signed)
Added apt per pof pt aware per pof

## 2015-12-02 ENCOUNTER — Ambulatory Visit (HOSPITAL_BASED_OUTPATIENT_CLINIC_OR_DEPARTMENT_OTHER): Payer: Self-pay | Admitting: Hematology and Oncology

## 2015-12-02 ENCOUNTER — Other Ambulatory Visit (HOSPITAL_BASED_OUTPATIENT_CLINIC_OR_DEPARTMENT_OTHER): Payer: Self-pay

## 2015-12-02 ENCOUNTER — Other Ambulatory Visit: Payer: Self-pay

## 2015-12-02 ENCOUNTER — Telehealth: Payer: Self-pay | Admitting: *Deleted

## 2015-12-02 ENCOUNTER — Ambulatory Visit: Payer: Self-pay

## 2015-12-02 ENCOUNTER — Ambulatory Visit (HOSPITAL_BASED_OUTPATIENT_CLINIC_OR_DEPARTMENT_OTHER): Payer: Self-pay

## 2015-12-02 ENCOUNTER — Telehealth: Payer: Self-pay | Admitting: Hematology and Oncology

## 2015-12-02 ENCOUNTER — Encounter: Payer: Self-pay | Admitting: Hematology and Oncology

## 2015-12-02 ENCOUNTER — Ambulatory Visit: Payer: Self-pay | Admitting: Nutrition

## 2015-12-02 VITALS — BP 124/76 | HR 101 | Resp 17

## 2015-12-02 VITALS — BP 120/71 | HR 120 | Temp 98.1°F | Resp 18 | Ht 69.0 in | Wt 174.9 lb

## 2015-12-02 DIAGNOSIS — T451X5A Adverse effect of antineoplastic and immunosuppressive drugs, initial encounter: Secondary | ICD-10-CM

## 2015-12-02 DIAGNOSIS — Z7189 Other specified counseling: Secondary | ICD-10-CM

## 2015-12-02 DIAGNOSIS — C787 Secondary malignant neoplasm of liver and intrahepatic bile duct: Principal | ICD-10-CM

## 2015-12-02 DIAGNOSIS — E440 Moderate protein-calorie malnutrition: Secondary | ICD-10-CM

## 2015-12-02 DIAGNOSIS — Z95828 Presence of other vascular implants and grafts: Secondary | ICD-10-CM

## 2015-12-02 DIAGNOSIS — I2699 Other pulmonary embolism without acute cor pulmonale: Secondary | ICD-10-CM

## 2015-12-02 DIAGNOSIS — C259 Malignant neoplasm of pancreas, unspecified: Secondary | ICD-10-CM

## 2015-12-02 DIAGNOSIS — D61810 Antineoplastic chemotherapy induced pancytopenia: Secondary | ICD-10-CM

## 2015-12-02 DIAGNOSIS — G893 Neoplasm related pain (acute) (chronic): Secondary | ICD-10-CM

## 2015-12-02 LAB — CBC WITH DIFFERENTIAL/PLATELET
BASO%: 0.9 % (ref 0.0–2.0)
Basophils Absolute: 0.1 10*3/uL (ref 0.0–0.1)
EOS ABS: 0.2 10*3/uL (ref 0.0–0.5)
EOS%: 1.1 % (ref 0.0–7.0)
HCT: 29.2 % — ABNORMAL LOW (ref 38.4–49.9)
HEMOGLOBIN: 9.1 g/dL — AB (ref 13.0–17.1)
LYMPH%: 7.9 % — AB (ref 14.0–49.0)
MCH: 25 pg — ABNORMAL LOW (ref 27.2–33.4)
MCHC: 31.2 g/dL — ABNORMAL LOW (ref 32.0–36.0)
MCV: 79.9 fL (ref 79.3–98.0)
MONO#: 1.5 10*3/uL — AB (ref 0.1–0.9)
MONO%: 10 % (ref 0.0–14.0)
NEUT%: 80.1 % — ABNORMAL HIGH (ref 39.0–75.0)
NEUTROS ABS: 12 10*3/uL — AB (ref 1.5–6.5)
PLATELETS: 80 10*3/uL — AB (ref 140–400)
RBC: 3.66 10*6/uL — AB (ref 4.20–5.82)
RDW: 16.5 % — ABNORMAL HIGH (ref 11.0–14.6)
WBC: 15 10*3/uL — AB (ref 4.0–10.3)
lymph#: 1.2 10*3/uL (ref 0.9–3.3)

## 2015-12-02 LAB — COMPREHENSIVE METABOLIC PANEL
ALBUMIN: 2.5 g/dL — AB (ref 3.5–5.0)
ALK PHOS: 411 U/L — AB (ref 40–150)
ALT: 37 U/L (ref 0–55)
ANION GAP: 11 meq/L (ref 3–11)
AST: 47 U/L — ABNORMAL HIGH (ref 5–34)
BILIRUBIN TOTAL: 2.25 mg/dL — AB (ref 0.20–1.20)
BUN: 9 mg/dL (ref 7.0–26.0)
CO2: 26 mEq/L (ref 22–29)
Calcium: 9.7 mg/dL (ref 8.4–10.4)
Chloride: 97 mEq/L — ABNORMAL LOW (ref 98–109)
Creatinine: 0.8 mg/dL (ref 0.7–1.3)
Glucose: 133 mg/dl (ref 70–140)
Potassium: 4.2 mEq/L (ref 3.5–5.1)
Sodium: 133 mEq/L — ABNORMAL LOW (ref 136–145)
TOTAL PROTEIN: 7.7 g/dL (ref 6.4–8.3)

## 2015-12-02 LAB — CANCER ANTIGEN 19-9

## 2015-12-02 MED ORDER — HYDROMORPHONE HCL 4 MG/ML IJ SOLN
4.0000 mg | INTRAMUSCULAR | Status: DC | PRN
  Administered 2015-12-02 (×2): 4 mg via INTRAVENOUS

## 2015-12-02 MED ORDER — SODIUM CHLORIDE 0.9 % IV SOLN
1000.0000 mL | Freq: Once | INTRAVENOUS | Status: AC
  Administered 2015-12-02: 1000 mL via INTRAVENOUS

## 2015-12-02 MED ORDER — HEPARIN SOD (PORK) LOCK FLUSH 100 UNIT/ML IV SOLN
500.0000 [IU] | Freq: Once | INTRAVENOUS | Status: AC | PRN
  Administered 2015-12-02: 500 [IU]
  Filled 2015-12-02: qty 5

## 2015-12-02 MED ORDER — OXYCODONE HCL 30 MG PO TABS: 30.0000 mg | ORAL_TABLET | ORAL | Status: AC | PRN

## 2015-12-02 MED ORDER — HYDROMORPHONE HCL 4 MG/ML IJ SOLN
INTRAMUSCULAR | Status: AC
  Filled 2015-12-02: qty 1

## 2015-12-02 MED ORDER — SODIUM CHLORIDE 0.9% FLUSH
10.0000 mL | INTRAVENOUS | Status: DC | PRN
  Administered 2015-12-02: 10 mL via INTRAVENOUS
  Filled 2015-12-02: qty 10

## 2015-12-02 MED ORDER — SODIUM CHLORIDE 0.9 % IJ SOLN
10.0000 mL | INTRAMUSCULAR | Status: DC | PRN
  Administered 2015-12-02: 10 mL
  Filled 2015-12-02: qty 10

## 2015-12-02 MED ORDER — DRONABINOL 5 MG PO CAPS: 5.0000 mg | ORAL_CAPSULE | Freq: Two times a day (BID) | ORAL | Status: AC

## 2015-12-02 NOTE — Progress Notes (Signed)
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 There are metastases to the liver, spleen and both kidneys   10/30/2015 Tumor Marker CA 19-9 was elevated at [id number]   10/30/2015 Imaging US venous Doppler are consistent with acute deep vein thrombosis involving the right peroneal vein, left posterial tibial vein, and left  peroneal vein.  Acute superficial vein thrombosis involving the left small saphenous vein.   10/30/2015 Imaging CT angiogram is positive for pulmonary emboli, involving left lower lobe segmental and subsegmental branches, as well as subsegmental branches of the right lower lobe   10/31/2015 Pathology Results Accession: [id number] liver biopsy was positive for adenocarcinoma consistent with metastatic pancreatic cancer.   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 He returns prior to cycle 2 of treatment. He noticed darker urine when he  urinates He has poorly controlled epigastric pain. He rated his pain as 9 out of 10 pain despite recent increment of pain medicine. The patient denies any recent signs or symptoms of bleeding such as spontaneous epistaxis, hematuria or hematochezia.  REVIEW OF SYSTEMS:   Constitutional: Denies fevers, chills or abnormal weight loss Eyes: Denies blurriness of vision Ears, nose, mouth, throat, and face: Denies mucositis or sore throat Respiratory: Denies cough, dyspnea or wheezes Cardiovascular: Denies palpitation, chest discomfort or lower extremity swelling Skin: Denies abnormal skin rashes Lymphatics: Denies new lymphadenopathy or easy bruising Neurological:Denies numbness, tingling or new weaknesses Behavioral/Psych: Mood is stable, no new changes  All other systems were reviewed with the patient and are negative.  I have reviewed the past medical history, past surgical history, social history and family history with the patient and they are unchanged from previous note.  ALLERGIES:  has No Known Allergies.  MEDICATIONS:  Current Outpatient Prescriptions  Medication Sig Dispense Refill  . ALPRAZolam (XANAX) 0.5 MG tablet Take 1 tablet (0.5 mg total) by mouth 3 (three) times daily as needed for anxiety (try first dose now if patient thinks he needs it. thank you). 15 tablet 0  . dronabinol (MARINOL) 5 MG capsule Take 1 capsule (5 mg total) by mouth 2 (two) times daily before a meal. 60 capsule 0  . feeding supplement (BOOST / RESOURCE BREEZE) LIQD Take 1 Container by mouth 2 (two) times daily between meals. (Patient not taking: Reported on 11/27/2015)  0  . ondansetron (ZOFRAN) 8 MG tablet Take 1 tablet (8 mg total) by mouth  every 8 (eight) hours as needed for nausea, vomiting or refractory nausea / vomiting. 30 tablet 1  . oxyCODONE (ROXICODONE) 30 MG immediate release tablet Take 1 tablet (30 mg total) by mouth every 3 (three) hours as needed for severe pain. Increased dose due to  uncontrolled pain 90 tablet 0  . oxyCODONE 30 MG 12 hr tablet Take 30 mg by mouth every 12 (twelve) hours. 60 tablet 0  . prochlorperazine (COMPAZINE) 10 MG tablet Take 1 tablet (10 mg total) by mouth every 6 (six) hours as needed (NAUSEA). 30 tablet 1  . Rivaroxaban (XARELTO STARTER PACK) 15 & 20 MG TBPK Take as directed on package: Start with one 15mg  tablet by mouth twice a day with food. On Day 22, switch to one 20mg  tablet once a day with food. 51 each 0  . Rivaroxaban (XARELTO) 15 MG TABS tablet Take 1 tablet (15 mg total) by mouth 2 (two) times daily with a meal. 42 tablet 0  . senna-docusate (SENOKOT-S) 8.6-50 MG tablet Take 2 tablets by mouth 2 (two) times daily. 120 tablet 0  . sertraline (ZOLOFT) 50 MG tablet Take 1 tablet (50 mg total) by mouth 2 (two) times daily. 30 tablet 3  . sorbitol 70 % SOLN Take 40 mLs by mouth 2 (two) times daily. 473 mL 1   Current Facility-Administered Medications  Medication Dose Route Frequency Provider Last Rate Last Dose  . HYDROmorphone (DILAUDID) injection 4 mg  4 mg Intravenous Q2H PRN Heath Lark, MD   4 mg at 12/02/15 1243   Facility-Administered Medications Ordered in Other Visits  Medication Dose Route Frequency Provider Last Rate Last Dose  . sodium chloride 0.9 % injection 10 mL  10 mL Intracatheter PRN Heath Lark, MD   10 mL at 12/02/15 1416    PHYSICAL EXAMINATION: ECOG PERFORMANCE STATUS: 1 - Symptomatic but completely ambulatory  Filed Vitals:   12/02/15 1016  BP: 120/71  Pulse: 120  Temp: 98.1 F (36.7 C)  Resp: 18   Filed Weights   12/02/15 1016  Weight: 174 lb 14.4 oz (79.334 kg)    GENERAL:alert, no distress and comfortable. He looks thinner SKIN: skin color, texture, turgor are normal, no rashes or significant lesions EYES: normal, Conjunctiva are pale and jaundiced  OROPHARYNX:no exudate, no erythema and lips, buccal mucosa, and tongue normal  NECK: supple, thyroid normal size, non-tender, without  nodularity Musculoskeletal:no cyanosis of digits and no clubbing  NEURO: alert & oriented x 3 with fluent speech, no focal motor/sensory deficits  LABORATORY DATA:  I have reviewed the data as listed    Component Value Date/Time   NA 133* 12/02/2015 0942   NA 134* 11/16/2015 0432   K 4.2 12/02/2015 0942   K 4.0 11/16/2015 0432   CL 103 11/16/2015 0432   CO2 26 12/02/2015 0942   CO2 25 11/16/2015 0432   GLUCOSE 133 12/02/2015 0942   GLUCOSE 94 11/16/2015 0432   BUN 9.0 12/02/2015 0942   BUN 12 11/16/2015 0432   CREATININE 0.8 12/02/2015 0942   CREATININE 0.96 11/16/2015 0432   CALCIUM 9.7 12/02/2015 0942   CALCIUM 8.9 11/16/2015 0432   PROT 7.7 12/02/2015 0942   PROT 6.9 11/14/2015 0435   ALBUMIN 2.5* 12/02/2015 0942   ALBUMIN 2.7* 11/14/2015 0435   AST 47* 12/02/2015 0942   AST 45* 11/14/2015 0435   ALT 37 12/02/2015 0942   ALT 46 11/14/2015 0435   ALKPHOS 411* 12/02/2015 0942   ALKPHOS 374* 11/14/2015 0435  BILITOT 2.25* 12/02/2015 0942   BILITOT 1.8* 11/14/2015 0435   GFRNONAA >60 11/16/2015 0432   GFRAA >60 11/16/2015 0432    No results found for: SPEP, UPEP  Lab Results  Component Value Date   WBC 15.0* 12/02/2015   NEUTROABS 12.0* 12/02/2015   HGB 9.1* 12/02/2015   HCT 29.2* 12/02/2015   MCV 79.9 12/02/2015   PLT 80* 12/02/2015      Chemistry      Component Value Date/Time   NA 133* 12/02/2015 0942   NA 134* 11/16/2015 0432   K 4.2 12/02/2015 0942   K 4.0 11/16/2015 0432   CL 103 11/16/2015 0432   CO2 26 12/02/2015 0942   CO2 25 11/16/2015 0432   BUN 9.0 12/02/2015 0942   BUN 12 11/16/2015 0432   CREATININE 0.8 12/02/2015 0942   CREATININE 0.96 11/16/2015 0432      Component Value Date/Time   CALCIUM 9.7 12/02/2015 0942   CALCIUM 8.9 11/16/2015 0432   ALKPHOS 411* 12/02/2015 0942   ALKPHOS 374* 11/14/2015 0435   AST 47* 12/02/2015 0942   AST 45* 11/14/2015 0435   ALT 37 12/02/2015 0942   ALT 46 11/14/2015 0435   BILITOT 2.25* 12/02/2015  0942   BILITOT 1.8* 11/14/2015 0435      ASSESSMENT & PLAN:  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 His liver function tests are worse. He has poorly controlled pain and continuous progressive decline in performance status. His tumor markers are high. Overall, I felt that the patient has no response to recent chemotherapy. His prognosis is poor due to diffuse disease. However, the patient was not able to accept this. He felt that with time, his blood count may improve although I doubt this is the case as the reason why his blood tests are getting worse this is likely due to disease progression. I strongly recommended palliative care and hospice but the patient declines. He would like to continue aggressive supportive care. We discussed potential referral to tertiary center but due to lack of insurance, we are not able to refer him right now. I will cancel his chemotherapy today and reschedule. I will see him back in 2 weeks for further assessment.  Cancer associated pain His pain remained poorly controlled. I recommend increasing the dose of extended release OxyContin 60 mg 2 times a day and oxycodone to 20 mg for the next couple days and then 30 mg as needed every 3 hours as needed. I refill his prescription today. I will reassess his pain control in the future  Protein-calorie malnutrition, moderate ([place]) He has lost a lot of weight with poor oral intake due to cancer diagnosis. I reinforced the importance of frequent small meals. Today, I will try to give him Marinol as appetite stimulant and nausea control.    Acute pulmonary embolism ([place]) The patient denies any recent signs or symptoms of bleeding such as spontaneous epistaxis, hematuria or hematochezia. With his low platelet count, he is at risk of bleeding So far, he is doing well. He will continue Xarelto indefinitely.  Pancytopenia due to antineoplastic chemotherapy  [facility]) This is likely due to recent treatment. The patient denies recent history of bleeding such as epistaxis, hematuria or hematochezia. He is asymptomatic from the low platelet count. I will observe for now.  I plan to hold his chemotherapy and rescheduled to next week. He does not need blood transfusion.  Goals of care, counseling/discussion The patient is aware he  has stage IV disease and treatment is strictly palliative. We discussed importance of Advanced Directives and Living will. We discussed CODE STATUS; the patient desires to remain in full code. I strongly urged the patient to consider changing his CODE STATUS to DO NOT RESUSCITATE and to consider stopping systemic treatment in view of evidence of disease progression due to worsening liver function tests The present time, he decline home-based palliative care/hospice.   I will continue supportive therapy in my office and we'll revisit this issue again in 2 weeks.     No orders of the defined types were placed in this encounter.   All questions were answered. The patient knows to call the clinic with any problems, questions or concerns. No barriers to learning was detected. I spent 25 minutes counseling the patient face to face. The total time spent in the appointment was 40 minutes and more than 50% was on counseling and review of test results     [facility], [place], MD 12/02/2015 3:20 PM

## 2015-12-02 NOTE — Assessment & Plan Note (Signed)
His pain remained poorly controlled. I recommend increasing the dose of extended release OxyContin 60 mg 2 times a day and oxycodone to 20 mg for the next couple days and then 30 mg as needed every 3 hours as needed. I refill his prescription today. I will reassess his pain control in the future

## 2015-12-02 NOTE — Assessment & Plan Note (Signed)
He has lost a lot of weight with poor oral intake due to cancer diagnosis. I reinforced the importance of frequent small meals. Today, I will try to give him Marinol as appetite stimulant and nausea control.

## 2015-12-02 NOTE — Progress Notes (Signed)
Patient was identified to be at risk for malnutrition on the MST secondary to weight loss and poor appetite.  45 year old male diagnosed with metastatic pancreas cancer.  He is a patient of Dr. Alvy Bimler.  Past medical history includes hernia and depression.  Medications include Xanax, Zofran, Compazine, Senokot, and Zoloft.  Labs include sodium of 134, albumin 2.6 on June 2.  Height: 69 inches. Weight: 174.9 pounds. Usual body weight: 195 pounds 10/28/2015. BMI: 25.82.  Patient reports increased pain prevents adequate oral intake. Patient does not care for oral nutrition supplements. Reports constipation has improved and he is able to have a bowel movement every other day.  Nutrition diagnosis:  Inadequate oral intake related to metastatic pancreas cancer as evidenced by 10% weight loss in 5 weeks.  Patient meets criteria for severe malnutrition in the context of chronic illness secondary to greater than 5% weight loss over one month and less than 75% energy intake for greater than one month.  Intervention:  Patient was educated to continue small frequent meals and snacks eating high-calorie high-protein foods as tolerated. Provided fact sheets on increasing calories and protein, and soft, moist protein foods. Encouraged patient to continue to explore oral nutrition supplements from grocery stores and provided examples Questions were answered.  Teach back method used.  Monitoring, evaluation, goals: Patient will tolerate increased calories and protein to promote weight maintenance.  Next visit: Monday, June 19, during infusion.  **Disclaimer: This note was dictated with voice recognition software. Similar sounding words can inadvertently be transcribed and this note may contain transcription errors which may not have been corrected upon publication of note.**

## 2015-12-02 NOTE — Patient Instructions (Signed)

## 2015-12-02 NOTE — Telephone Encounter (Signed)
per pof to sch pt appt-sent MW email to sch trmt-pt to get updated copy b4 leaving trmt °

## 2015-12-02 NOTE — Assessment & Plan Note (Signed)
The patient denies any recent signs or symptoms of bleeding such as spontaneous epistaxis, hematuria or hematochezia. With his low platelet count, he is at risk of bleeding So far, he is doing well. He will continue Xarelto indefinitely.

## 2015-12-02 NOTE — Telephone Encounter (Signed)
Per staff message and POF I have scheduled appts. Advised scheduler of appts and to move lab/flush JMW  

## 2015-12-02 NOTE — Assessment & Plan Note (Signed)
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 We discussed importance of Advanced Directives and Living will. We discussed CODE STATUS; the patient desires to remain in full code. I strongly urged the patient to consider changing his CODE STATUS to DO NOT RESUSCITATE and to consider stopping systemic treatment in view of evidence of disease progression due to worsening liver function tests The present time, he decline home-based palliative care/hospice.   I will continue supportive therapy in my office and we'll revisit this issue again in 2 weeks.

## 2015-12-02 NOTE — Assessment & Plan Note (Signed)
This is likely due to recent treatment. The patient denies recent history of bleeding such as epistaxis, hematuria or hematochezia. He is asymptomatic from the low platelet count. I will observe for now.  I plan to hold his chemotherapy and rescheduled to next week. He does not need blood transfusion.

## 2015-12-02 NOTE — Assessment & Plan Note (Signed)
The patient has persistent pancytopenia. His liver function tests are worse. He has poorly controlled pain and continuous progressive decline in performance status. His tumor markers are high. Overall, I felt that the patient has no response to recent chemotherapy. His prognosis is poor due to diffuse disease. However, the patient was not able to accept this. He felt that with time, his blood count may improve although I doubt this is the case as the reason why his blood tests are getting worse this is likely due to disease progression. I strongly recommended palliative care and hospice but the patient declines. He would like to continue aggressive supportive care. We discussed potential referral to tertiary center but due to lack of insurance, we are not able to refer him right now. I will cancel his chemotherapy today and reschedule. I will see him back in 2 weeks for further assessment.

## 2015-12-03 ENCOUNTER — Other Ambulatory Visit: Payer: Self-pay | Admitting: *Deleted

## 2015-12-03 ENCOUNTER — Telehealth: Payer: Self-pay | Admitting: *Deleted

## 2015-12-03 LAB — CANCER ANTIGEN 19-9: CA 19-9: 857400 U/mL — ABNORMAL HIGH (ref 0–35)

## 2015-12-03 MED ORDER — OXYCODONE HCL ER 60 MG PO T12A: 60.0000 mg | EXTENDED_RELEASE_TABLET | Freq: Two times a day (BID) | ORAL | Status: AC

## 2015-12-03 NOTE — Telephone Encounter (Signed)
Pt's Fiance lvm pt needs refill on long acting oxycodone.  Called her back and lvm that Rx will be ready for pick up tomorrow after 8:30 am.

## 2015-12-03 NOTE — Telephone Encounter (Signed)
"  This is Threasa Beards with [employer] calling about a prescription written by Dr Alvy Bimler yesterday for this patient for Oxy IR.  We need to verify this order." Read order to [facility] as in EMR.  No further questions.  Call ended.

## 2015-12-04 ENCOUNTER — Telehealth: Payer: Self-pay | Admitting: *Deleted

## 2015-12-04 NOTE — Telephone Encounter (Signed)
Patient's girlfriend Nichelle Person called reporting "he has been approved for Medicaid.  We were told to call so he can get set up for an appointment.  HE PREFERS [facility]."  Asked that card be brought in for a copy.  "They are entering the information today so everything should be in the system tomorrow."  Call transferred to collaborative nurse as requested by Nichelle.  Nichelle left Medicaid number on collaborative voicemail.  Return number if any questions is [phone].

## 2015-12-05 ENCOUNTER — Encounter: Payer: Self-pay | Admitting: Hematology and Oncology

## 2015-12-05 ENCOUNTER — Encounter: Payer: Self-pay | Admitting: *Deleted

## 2015-12-05 ENCOUNTER — Telehealth: Payer: Self-pay | Admitting: *Deleted

## 2015-12-05 NOTE — Telephone Encounter (Signed)
Call from pt's Fiance, Nichelle, reporting an error with pt's oxycodone filled at [facility].   She states she noticed pt's oxycodone 30 mg IR pills looked identical to the pills in pt's "slow release" oxycodone 30 mg ER pills.  She took the bottles back to [facility] and they told her that they were both the immediate release pills. They made an error by giving pt IR instead of ER as prescribed.  Pt has  been taking IR when he thought it was extended release.  Fiance says she took new Rx for Oxycodone 60 mg ER to [facility] and will fill all his pain meds at [facility] from now on.  She wanted to make sure it is safe for pt to take 60 mg ER because dose was increased based on thinking pt was taking ER already.   Informed Nichelle that since pt is taking Oxy 30 mg IR every 3 hrs,  It is ok to take the Oxy 60 mg ER twice a day as directed.  Hopefully this will control pt's pain better.  She verbalized understanding. She also states pt wants referral to [facility] instead of [facility] since Dr. Alvy Bimler recommends [facility].  Pt wants second opinion from [facility] Dr. Alvy Bimler thinks is best.  Informed her I will call [facility] today for the referral.

## 2015-12-05 NOTE — Progress Notes (Signed)
1142 Voicemail from [phone] retrieved at 1229, forwarded to ext [phone] from patient's significant other "Nichelle Person reporting [place] needs Prior authorization for the Oxycodone 12 hour pain medicine she dropped off yesterday."

## 2015-12-05 NOTE — Telephone Encounter (Signed)
Referral for Second Opinion made to [facility].  I spoke w/ New Pt Coordinator, Fuller Canada.  She scheduled appt for pt to see Dr. Earnestine Mealing tomorrow at 3 pm.  She will contact pt's Fiance w/ appt information.   Horris Latino can access records via Care Everywhere.  She cannot view Path report.  I faxed path report to her fax [phone].  I s/w Tedra Coupe in our Radiology office.  She will export CT scans to [facility] via "Power Share."  I s/w Varney Biles at [facility] to request pathology slides be sent overnight to [facility].  I faxed over Request form to pathology dept for this request.  Slides to be sent to; Fuller Canada, [address] [id number], [place], [place] [address]. Phone [phone].

## 2015-12-05 NOTE — Telephone Encounter (Signed)
Called Nathan [name] to make sure she is aware of appt at [facility] tomorrow.  She was called by [facility] and is aware of appt. Information.

## 2015-12-05 NOTE — Progress Notes (Signed)
Medicaid ID [id number]

## 2015-12-05 NOTE — Progress Notes (Signed)
Faxed nctrks form [phone] for oxycontin prior auth

## 2015-12-06 ENCOUNTER — Telehealth: Payer: Self-pay

## 2015-12-06 MED FILL — OxyCONTIN 60 MG T12A: 60 | 30 days supply | Qty: 60 | Fill #0

## 2015-12-06 NOTE — Telephone Encounter (Signed)
Pt called to clarify how to take his oxycodone. S/w cameo RN and called pt back. The Rx for oxycontin is awaiting prior authorization from insurance company. Pt only has oxycodone in the house at present. It is best for him to take oxycodone every 3 hours as needed for pain. He has appt at [facility] today and he should take all his pill bottles with him. [facility] can also help him with medication questions.

## 2015-12-09 ENCOUNTER — Other Ambulatory Visit: Payer: Self-pay

## 2015-12-09 ENCOUNTER — Other Ambulatory Visit: Payer: Self-pay | Admitting: Hematology and Oncology

## 2015-12-09 ENCOUNTER — Telehealth: Payer: Self-pay | Admitting: *Deleted

## 2015-12-09 ENCOUNTER — Ambulatory Visit: Payer: Self-pay

## 2015-12-09 ENCOUNTER — Other Ambulatory Visit (HOSPITAL_BASED_OUTPATIENT_CLINIC_OR_DEPARTMENT_OTHER): Payer: Self-pay

## 2015-12-09 ENCOUNTER — Telehealth: Payer: Self-pay | Admitting: Hematology and Oncology

## 2015-12-09 ENCOUNTER — Encounter: Payer: Self-pay | Admitting: Hematology and Oncology

## 2015-12-09 ENCOUNTER — Ambulatory Visit (HOSPITAL_BASED_OUTPATIENT_CLINIC_OR_DEPARTMENT_OTHER): Payer: Self-pay | Admitting: Hematology and Oncology

## 2015-12-09 VITALS — BP 155/87 | HR 124 | Temp 97.9°F

## 2015-12-09 DIAGNOSIS — C259 Malignant neoplasm of pancreas, unspecified: Secondary | ICD-10-CM

## 2015-12-09 DIAGNOSIS — C787 Secondary malignant neoplasm of liver and intrahepatic bile duct: Principal | ICD-10-CM

## 2015-12-09 DIAGNOSIS — I2699 Other pulmonary embolism without acute cor pulmonale: Secondary | ICD-10-CM

## 2015-12-09 DIAGNOSIS — G893 Neoplasm related pain (acute) (chronic): Secondary | ICD-10-CM

## 2015-12-09 DIAGNOSIS — Z7189 Other specified counseling: Secondary | ICD-10-CM

## 2015-12-09 LAB — COMPREHENSIVE METABOLIC PANEL
ALBUMIN: 2.5 g/dL — AB (ref 3.5–5.0)
ALK PHOS: 451 U/L — AB (ref 40–150)
ALT: 43 U/L (ref 0–55)
AST: 101 U/L — AB (ref 5–34)
Anion Gap: 14 mEq/L — ABNORMAL HIGH (ref 3–11)
BUN: 17.9 mg/dL (ref 7.0–26.0)
CALCIUM: 10.1 mg/dL (ref 8.4–10.4)
CO2: 25 mEq/L (ref 22–29)
CREATININE: 1 mg/dL (ref 0.7–1.3)
Chloride: 94 mEq/L — ABNORMAL LOW (ref 98–109)
EGFR: >90 mL/min/{1.73_m2} (ref >90)
Glucose: 141 mg/dl — ABNORMAL HIGH (ref 70–140)
POTASSIUM: 4.5 meq/L (ref 3.5–5.1)
Sodium: 133 mEq/L — ABNORMAL LOW (ref 136–145)
Total Bilirubin: 7.17 mg/dL (ref 0.20–1.20)
Total Protein: 8 g/dL (ref 6.4–8.3)

## 2015-12-09 LAB — CBC WITH DIFFERENTIAL/PLATELET
BASO%: 0.4 % (ref 0.0–2.0)
BASOS ABS: 0.1 10*3/uL (ref 0.0–0.1)
EOS ABS: 0.2 10*3/uL (ref 0.0–0.5)
EOS%: 0.6 % (ref 0.0–7.0)
HEMATOCRIT: 30.1 % — AB (ref 38.4–49.9)
HEMOGLOBIN: 9.5 g/dL — AB (ref 13.0–17.1)
LYMPH#: 1.8 10*3/uL (ref 0.9–3.3)
LYMPH%: 6.8 % — ABNORMAL LOW (ref 14.0–49.0)
MCH: 24.9 pg — AB (ref 27.2–33.4)
MCHC: 31.6 g/dL — ABNORMAL LOW (ref 32.0–36.0)
MCV: 79 fL — ABNORMAL LOW (ref 79.3–98.0)
MONO#: 2.1 10*3/uL — AB (ref 0.1–0.9)
MONO%: 8 % (ref 0.0–14.0)
NEUT#: 22.1 10*3/uL — ABNORMAL HIGH (ref 1.5–6.5)
NEUT%: 84.2 % — AB (ref 39.0–75.0)
PLATELETS: 23 10*3/uL — AB (ref 140–400)
RBC: 3.81 10*6/uL — ABNORMAL LOW (ref 4.20–5.82)
RDW: 17.2 % — ABNORMAL HIGH (ref 11.0–14.6)
WBC: 26.3 10*3/uL — ABNORMAL HIGH (ref 4.0–10.3)
nRBC: 1 % — ABNORMAL HIGH (ref 0–0)

## 2015-12-09 MED ORDER — HYDROMORPHONE HCL 4 MG/ML IJ SOLN
4.0000 mg | INTRAMUSCULAR | Status: DC | PRN
Start: 2015-12-09 — End: 2015-12-09

## 2015-12-09 MED ORDER — SODIUM CHLORIDE 0.9 % IV SOLN
Freq: Once | INTRAVENOUS | Status: AC
  Administered 2015-12-09: 12:00:00 via INTRAVENOUS
  Filled 2015-12-09: qty 4

## 2015-12-09 MED ORDER — HYDROMORPHONE HCL 4 MG/ML IJ SOLN
INTRAMUSCULAR | Status: AC
  Filled 2015-12-09: qty 1

## 2015-12-09 MED ORDER — ONDANSETRON HCL 40 MG/20ML IJ SOLN
Freq: Once | INTRAMUSCULAR | Status: AC
  Filled 2015-12-09: qty 4

## 2015-12-09 MED ORDER — SODIUM CHLORIDE 0.9 % IJ SOLN
10.0000 mL | INTRAMUSCULAR | Status: DC | PRN
  Administered 2015-12-09: 10 mL
  Filled 2015-12-09: qty 10

## 2015-12-09 MED ORDER — SODIUM CHLORIDE 0.9 % IV SOLN
1000.0000 mL | Freq: Once | INTRAVENOUS | Status: AC
  Administered 2015-12-09: 1000 mL via INTRAVENOUS

## 2015-12-09 MED ORDER — HYDROMORPHONE HCL 4 MG/ML IJ SOLN
4.0000 mg | INTRAMUSCULAR | Status: DC | PRN
  Administered 2015-12-09 (×2): 4 mg via INTRAVENOUS

## 2015-12-09 MED ORDER — HEPARIN SOD (PORK) LOCK FLUSH 100 UNIT/ML IV SOLN
500.0000 [IU] | Freq: Once | INTRAVENOUS | Status: AC | PRN
  Administered 2015-12-09: 500 [IU]
  Filled 2015-12-09: qty 5

## 2015-12-09 MED ORDER — SODIUM CHLORIDE 0.9 % IV SOLN: 1000.0000 mL | Freq: Once | INTRAVENOUS | Status: AC

## 2015-12-09 NOTE — Progress Notes (Signed)
IVFs and pain/ nausea meds given in Exam Room due to chair not available in Infusion until his scheduled time of 12:30 pm and pt was too uncomfortable to wait in lobby.  Pt's fiance stayed w/ pt during infusion.  Instructed fiance to stop pt's Xarelto due to low platelet count per Dr. Alvy Bimler.  She verbalized understanding. Informed her of Referral to Hospice made today and they will contact pt's mother to make arrangements to visit.  She verbalized understanding.

## 2015-12-09 NOTE — Telephone Encounter (Signed)
Gave pt apt & avs °

## 2015-12-09 NOTE — Assessment & Plan Note (Signed)
His pain remained poorly controlled. I recommend increasing the dose of extended release OxyContin 60 mg 2 times a day and oxycodone to 20 mg for the next couple days and then 30 mg as needed every 3 hours as needed. He was given IV Dilaudid in my office. I will transition his care to palliative care for chronic pain management and home

## 2015-12-09 NOTE — Telephone Encounter (Signed)
Called pt's Fiance regarding appt at [place] last week.  [facility] MD also recommended Hospice.   Nathan [name] states pt would like to come in today to have labs and see Dr. Alvy Bimler to discuss this again and any other possible treatments.  Pt added on to Dr. Calton Dach schedule today and Jonnie Finner is aware.

## 2015-12-09 NOTE — Assessment & Plan Note (Signed)
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 We discussed importance of Advanced Directives and Living will. I strongly urged the patient to consider changing his CODE STATUS to DO NOT RESUSCITATE  I also recommend consideration for inpatient hospital admission and then transitioned his care to hospice but his girlfriend decline. We will get consultation with home-based hospice/palliative care for him

## 2015-12-09 NOTE — Telephone Encounter (Signed)
Hospice referral called to [facility].   Dr. Alvy Bimler will be Attending.  They will contact pt's mother today to arrange visit.

## 2015-12-09 NOTE — Progress Notes (Signed)
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 There are metastases to the liver, spleen and both kidneys   10/30/2015 Tumor Marker CA 19-9 was elevated at [id number]   10/30/2015 Imaging US venous Doppler are consistent with acute deep vein thrombosis involving the right peroneal vein, left posterial tibial vein, and left  peroneal vein.  Acute superficial vein thrombosis involving the left small saphenous vein.   10/30/2015 Imaging CT angiogram is positive for pulmonary emboli, involving left lower lobe segmental and subsegmental branches, as well as subsegmental branches of the right lower lobe   10/31/2015 Pathology Results Accession: [id number] liver biopsy was positive for adenocarcinoma consistent with metastatic pancreatic cancer.   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 He is seen today to discuss recent recommendation from [facility] His pain remained  poorly controlled. He rated his pain at 9 out of 10 today, centered around the umbilicus He has poor oral intake with weight loss Denies nausea or vomiting His girlfriend noted he is quite sedated with increased pain medicine  REVIEW OF SYSTEMS:   Constitutional: Denies fevers, chills  Eyes: Denies blurriness of vision Ears, nose, mouth, throat, and face: Denies mucositis or sore throat Respiratory: Denies cough, dyspnea or wheezes Cardiovascular: Denies palpitation, chest discomfort or lower extremity swelling Skin: Denies abnormal skin rashes Lymphatics: Denies new lymphadenopathy or easy bruising Neurological:Denies numbness, tingling or new weaknesses Behavioral/Psych: Mood is stable, no new changes  All other systems were reviewed with the patient and are negative.  I have reviewed the past medical history, past surgical history, social history and family history with the patient and they are unchanged from previous note.  ALLERGIES:  has No Known Allergies.  MEDICATIONS:  Current Outpatient Prescriptions  Medication Sig Dispense Refill  . ALPRAZolam (XANAX) 0.5 MG tablet Take 1 tablet (0.5 mg total) by mouth 3 (three) times daily as needed for anxiety (try first dose now if patient thinks he needs it. thank you). 15 tablet 0  . feeding supplement (BOOST / RESOURCE BREEZE) LIQD Take 1 Container by mouth 2 (two) times daily between meals.  0  . ondansetron (ZOFRAN) 8 MG tablet Take 1 tablet (8 mg total) by mouth every 8 (eight) hours as needed for nausea, vomiting or refractory nausea / vomiting. 30 tablet 1  . oxyCODONE (OXYCONTIN) 60 MG 12 hr tablet Take 60 mg by mouth 2 (two) times daily. [place]  each 0  . oxyCODONE (ROXICODONE) 30 MG immediate release tablet Take 1 tablet (30 mg total) by mouth every 3 (three) hours as needed for severe pain. Increased dose due to uncontrolled pain 90 tablet 0  . prochlorperazine (COMPAZINE) 10 MG tablet Take 1 tablet (10 mg total) by mouth every 6  (six) hours as needed (NAUSEA). 30 tablet 1  . senna-docusate (SENOKOT-S) 8.6-50 MG tablet Take 2 tablets by mouth 2 (two) times daily. 120 tablet 0  . sertraline (ZOLOFT) 50 MG tablet Take 1 tablet (50 mg total) by mouth 2 (two) times daily. 30 tablet 3  . sorbitol 70 % SOLN Take 40 mLs by mouth 2 (two) times daily. 473 mL 1  . dronabinol (MARINOL) 5 MG capsule Take 1 capsule (5 mg total) by mouth 2 (two) times daily before a meal. (Patient not taking: Reported on 12/09/2015) 60 capsule 0   Current Facility-Administered Medications  Medication Dose Route Frequency Provider Last Rate Last Dose  . HYDROmorphone (DILAUDID) injection 4 mg  4 mg Intravenous Q2H PRN Heath Lark, MD   4 mg at 12/09/15 1145  . sodium chloride 0.9 % injection 10 mL  10 mL Intracatheter PRN Heath Lark, MD   10 mL at 12/09/15 1254   Facility-Administered Medications Ordered in Other Visits  Medication Dose Route Frequency Provider Last Rate Last Dose  . HYDROmorphone (DILAUDID) injection 4 mg  4 mg Intravenous Q2H PRN Jeanette Rauth, MD      . sodium chloride 0.9 % injection 10 mL  10 mL Intracatheter PRN Heath Lark, MD   10 mL at 12/02/15 1416    PHYSICAL EXAMINATION: ECOG PERFORMANCE STATUS: 4 - Bedbound  Filed Vitals:   12/09/15 1020  BP: 155/87  Pulse: 124  Temp: 97.9 F (36.6 C)   There were no vitals filed for this visit.  GENERAL:He is not very alert. Appears in mild distress from pain SKIN: skin color, texture, turgor are normal, no rashes or significant lesions EYES: normal, Conjunctiva are jaundiced OROPHARYNX:no exudate, no erythema and lips, buccal mucosa, and tongue normal  ABDOMEN:abdomen tender on gentle palpation. Musculoskeletal:no cyanosis of digits and no clubbing  NEURO: He is intermittently alert but appears to be in pain. No focal neurological deficit  LABORATORY DATA:  I have reviewed the data as listed    Component Value Date/Time   NA 133* 12/09/2015 0951   NA 134* 11/16/2015 0432    K 4.5 12/09/2015 0951   K 4.0 11/16/2015 0432   CL 103 11/16/2015 0432   CO2 25 12/09/2015 0951   CO2 25 11/16/2015 0432   GLUCOSE 141* 12/09/2015 0951   GLUCOSE 94 11/16/2015 0432   BUN 17.9 12/09/2015 0951   BUN 12 11/16/2015 0432   CREATININE 1.0 12/09/2015 0951   CREATININE 0.96 11/16/2015 0432   CALCIUM 10.1 12/09/2015 0951   CALCIUM 8.9 11/16/2015 0432   PROT 8.0 12/09/2015 0951   PROT 6.9 11/14/2015 0435   ALBUMIN 2.5* 12/09/2015 0951   ALBUMIN 2.7* 11/14/2015 0435   AST 101* 12/09/2015 0951   AST 45* 11/14/2015 0435   ALT 43 12/09/2015 0951   ALT 46 11/14/2015 0435   ALKPHOS 451* 12/09/2015 0951   ALKPHOS 374* 11/14/2015 0435   BILITOT 7.17* 12/09/2015 0951   BILITOT 1.8* 11/14/2015 0435   GFRNONAA >60 11/16/2015 0432   GFRAA >60 11/16/2015 0432    No results found for: SPEP, UPEP  Lab Results  Component Value Date   WBC 26.3* 12/09/2015  NEUTROABS 22.1* 12/09/2015   HGB 9.5* 12/09/2015   HCT 30.1* 12/09/2015   MCV 79.0* 12/09/2015   PLT 23* 12/09/2015      Chemistry      Component Value Date/Time   NA 133* 12/09/2015 0951   NA 134* 11/16/2015 0432   K 4.5 12/09/2015 0951   K 4.0 11/16/2015 0432   CL 103 11/16/2015 0432   CO2 25 12/09/2015 0951   CO2 25 11/16/2015 0432   BUN 17.9 12/09/2015 0951   BUN 12 11/16/2015 0432   CREATININE 1.0 12/09/2015 0951   CREATININE 0.96 11/16/2015 0432      Component Value Date/Time   CALCIUM 10.1 12/09/2015 0951   CALCIUM 8.9 11/16/2015 0432   ALKPHOS 451* 12/09/2015 0951   ALKPHOS 374* 11/14/2015 0435   AST 101* 12/09/2015 0951   AST 45* 11/14/2015 0435   ALT 43 12/09/2015 0951   ALT 46 11/14/2015 0435   BILITOT 7.17* 12/09/2015 0951   BILITOT 1.8* 11/14/2015 0435      ASSESSMENT & PLAN:  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 His liver function tests are worse. He has poorly controlled pain and continuous progressive decline in performance  status. His tumor markers are high. Overall, I felt that the patient has no response to recent chemotherapy. His prognosis is poor due to diffuse disease. I recommend a hospice and palliative care Lastly, I had referred him to [facility] and the oncologist recommended palliative care and hospice for the patient as well Ultimately, we discussed supportive care with IV fluids, anti-emetics and IV pain medicine until he gets transitioned to home-based palliative care. I did offer him admission to the hospital to transition his care but his girlfriend felt that they should be able to take care of him at home with home-based palliative care and hospice.  Cancer associated pain His pain remained poorly controlled. I recommend increasing the dose of extended release OxyContin 60 mg 2 times a day and oxycodone to 20 mg for the next couple days and then 30 mg as needed every 3 hours as needed. He was given IV Dilaudid in my office. I will transition his care to palliative care for chronic pain management and home  Recurrent pulmonary emboli ([place]) With progressive liver failure and pancytopenia, I recommend that he stop Xarelto to reduce risk of bleeding  Goals of care, counseling/discussion The patient is aware he has stage IV disease and treatment is strictly palliative. We discussed importance of Advanced Directives and Living will. I strongly urged the patient to consider changing his CODE STATUS to DO NOT RESUSCITATE  I also recommend consideration for inpatient hospital admission and then transitioned his care to hospice but his girlfriend decline. We will get consultation with home-based hospice/palliative care for him    Orders Placed This Encounter  Procedures  . SCHEDULING COMMUNICATION    Please schedule 2 hour infusion visit for fluids and hydration   All questions were answered. The patient knows to call the clinic with any problems, questions or concerns. No barriers to learning  was detected. I spent 25 minutes counseling the patient face to face. The total time spent in the appointment was 30 minutes and more than 50% was on counseling and review of test results     [facility] [place], MD 12/09/2015 1:37 PM

## 2015-12-09 NOTE — Assessment & Plan Note (Signed)
The patient has persistent pancytopenia. His liver function tests are worse. He has poorly controlled pain and continuous progressive decline in performance status. His tumor markers are high. Overall, I felt that the patient has no response to recent chemotherapy. His prognosis is poor due to diffuse disease. I recommend a hospice and palliative care Lastly, I had referred him to [facility] and the oncologist recommended palliative care and hospice for the patient as well Ultimately, we discussed supportive care with IV fluids, anti-emetics and IV pain medicine until he gets transitioned to home-based palliative care. I did offer him admission to the hospital to transition his care but his girlfriend felt that they should be able to take care of him at home with home-based palliative care and hospice.

## 2015-12-09 NOTE — Assessment & Plan Note (Signed)
With progressive liver failure and pancytopenia, I recommend that he stop Xarelto to reduce risk of bleeding

## 2015-12-10 ENCOUNTER — Telehealth: Payer: Self-pay | Admitting: *Deleted

## 2015-12-10 LAB — CANCER ANTIGEN 19-9

## 2015-12-10 NOTE — Telephone Encounter (Signed)
VM from [place] w/ [facility] states pt requesting DNR form in home and need verbal order from Dr. Alvy Bimler so Hospice MD can sign.  Called Hospice and gave verbal order for DNR from Dr. Alvy Bimler to Amy.

## 2015-12-11 ENCOUNTER — Telehealth: Payer: Self-pay | Admitting: *Deleted

## 2015-12-11 NOTE — Telephone Encounter (Signed)
Call from Hospice RN reports pt's pain is not managed on current medications of Oxycontin 60 mg BID and Oxy IR 30 mg q 3 hrs prn.  Requested Hospice RN contact Hospice MD for assessment/ and orders for pain management.  Dr. Alvy Bimler offered to admit pt to hospital on Monday for pain management but at that time his family felt his pain was being managed ok on oral meds.  Unsure if increasing oxycodone dose is going to help his pain at this time.  Dr. Alvy Bimler had discussed pt possibly needing IV dilaudid via portable pump at home for better pain management.   Dr. Alvy Bimler requesting Hospice MD to evaluate pt's pain at this time please.   Hospice RN will contact Dr. Odetta Pink.

## 2015-12-12 ENCOUNTER — Other Ambulatory Visit: Payer: Self-pay | Admitting: *Deleted

## 2015-12-12 ENCOUNTER — Telehealth: Payer: Self-pay | Admitting: *Deleted

## 2015-12-12 ENCOUNTER — Other Ambulatory Visit: Payer: Self-pay | Admitting: Hematology and Oncology

## 2015-12-12 MED FILL — CHLORPROMAZINE 25 MG TABLET: 25 | 5 days supply | Qty: 60 | Fill #0

## 2015-12-12 MED FILL — DRONABINOL 5 MG CAPSULE: 5 | 30 days supply | Qty: 60 | Fill #0

## 2015-12-12 NOTE — Telephone Encounter (Signed)
TC from [place], [place] with [facility] with a medication update on pt. He is now on Thorazine 25 mg every 4 hours as needed for hiccups, restlessness and agitation; Roxicodone has been increased to every 2 hours as needed for pain. Hospice nurse and family believe some of his pain  Is also anxiety about his current state, though he rates his pain @ a 6/10.  Hospice RN will continue to evaluate pain/hiccups and anxiety.

## 2015-12-15 ENCOUNTER — Other Ambulatory Visit: Payer: Self-pay

## 2015-12-15 ENCOUNTER — Emergency Department (HOSPITAL_COMMUNITY)
Admission: EM | Admit: 2015-12-15 | Discharge: 2015-12-28 | Disposition: E | Payer: Medicaid Other | Attending: Emergency Medicine | Admitting: Emergency Medicine

## 2015-12-15 ENCOUNTER — Encounter (HOSPITAL_COMMUNITY): Payer: Self-pay

## 2015-12-15 DIAGNOSIS — I959 Hypotension, unspecified: Secondary | ICD-10-CM

## 2015-12-15 DIAGNOSIS — Z8507 Personal history of malignant neoplasm of pancreas: Secondary | ICD-10-CM | POA: Insufficient documentation

## 2015-12-15 DIAGNOSIS — Z87891 Personal history of nicotine dependence: Secondary | ICD-10-CM | POA: Insufficient documentation

## 2015-12-15 DIAGNOSIS — C799 Secondary malignant neoplasm of unspecified site: Secondary | ICD-10-CM

## 2015-12-15 DIAGNOSIS — J9600 Acute respiratory failure, unspecified whether with hypoxia or hypercapnia: Secondary | ICD-10-CM | POA: Diagnosis not present

## 2015-12-15 DIAGNOSIS — D61810 Antineoplastic chemotherapy induced pancytopenia: Secondary | ICD-10-CM | POA: Insufficient documentation

## 2015-12-15 DIAGNOSIS — E860 Dehydration: Secondary | ICD-10-CM | POA: Diagnosis not present

## 2015-12-15 DIAGNOSIS — R092 Respiratory arrest: Secondary | ICD-10-CM | POA: Diagnosis present

## 2015-12-15 DIAGNOSIS — T451X5A Adverse effect of antineoplastic and immunosuppressive drugs, initial encounter: Secondary | ICD-10-CM

## 2015-12-15 DIAGNOSIS — C787 Secondary malignant neoplasm of liver and intrahepatic bile duct: Secondary | ICD-10-CM | POA: Insufficient documentation

## 2015-12-15 DIAGNOSIS — Z515 Encounter for palliative care: Secondary | ICD-10-CM

## 2015-12-15 DIAGNOSIS — R402431 Glasgow coma scale score 3-8, in the field [EMT or ambulance]: Secondary | ICD-10-CM

## 2015-12-15 DIAGNOSIS — C259 Malignant neoplasm of pancreas, unspecified: Secondary | ICD-10-CM

## 2015-12-15 MED ORDER — LORAZEPAM 2 MG/ML IJ SOLN
0.5000 mg | INTRAMUSCULAR | Status: DC | PRN
  Administered 2015-12-15: 0.5 mg via INTRAVENOUS
  Filled 2015-12-15: qty 1

## 2015-12-15 MED ORDER — LORAZEPAM 2 MG/ML IJ SOLN
1.0000 mg | Freq: Once | INTRAMUSCULAR | Status: AC
  Administered 2015-12-15: 1 mg via INTRAVENOUS

## 2015-12-15 MED ORDER — MORPHINE SULFATE (PF) 4 MG/ML IV SOLN
4.0000 mg | Freq: Once | INTRAVENOUS | Status: AC
  Administered 2015-12-15: 4 mg via INTRAVENOUS

## 2015-12-15 MED ORDER — LORAZEPAM 2 MG/ML IJ SOLN
INTRAMUSCULAR | Status: AC
  Administered 2015-12-15: 1 mg via INTRAVENOUS
  Filled 2015-12-15: qty 1

## 2015-12-15 MED ORDER — MORPHINE SULFATE (PF) 4 MG/ML IV SOLN: 4.0000 mg | Freq: Once | INTRAVENOUS | Status: DC

## 2015-12-15 MED ORDER — LORAZEPAM 2 MG/ML IJ SOLN: 1.0000 mg | Freq: Once | INTRAMUSCULAR | Status: DC

## 2015-12-15 MED ORDER — MORPHINE SULFATE (PF) 4 MG/ML IV SOLN
INTRAVENOUS | Status: AC
  Administered 2015-12-15: 4 mg via INTRAVENOUS
  Filled 2015-12-15: qty 1

## 2015-12-15 MED ORDER — SODIUM CHLORIDE 0.9 % IV SOLN
1.0000 mg/h | INTRAVENOUS | Status: DC
  Filled 2015-12-15: qty 10

## 2015-12-15 MED ORDER — MORPHINE SULFATE (PF) 2 MG/ML IV SOLN
2.0000 mg | Freq: Once | INTRAVENOUS | Status: AC
  Administered 2015-12-15: 2 mg via INTRAVENOUS
  Filled 2015-12-15: qty 1

## 2015-12-16 ENCOUNTER — Ambulatory Visit: Payer: Self-pay | Admitting: Hematology and Oncology

## 2015-12-16 ENCOUNTER — Other Ambulatory Visit: Payer: Self-pay

## 2015-12-16 ENCOUNTER — Encounter: Payer: Self-pay | Admitting: Nutrition

## 2015-12-16 ENCOUNTER — Ambulatory Visit: Payer: Self-pay

## 2015-12-28 DIAGNOSIS — 419620001 Death: Secondary | SNOMED CT

## 2015-12-28 NOTE — Progress Notes (Signed)
   29-Dec-2015 1305  Clinical Encounter Type  Visited With Family  Visit Type ED  Referral From Nurse  Consult/Referral To [facility])  Does patient have an advance directive? Yes  Type of Advance Directive Out of facility DNR (pink MOST or yellow form) (family revoked DNR upon EMS arrival to home)  [facility] responded to ED at 1:03 pm. Patient unresponsive, had DNR but family wanted support given. [facility] provided support throughout the afternoon and returned when life support was being withdrawn to support family. Roe Coombs 29-Dec-2015

## 2015-12-28 NOTE — ED Notes (Signed)
Emotional support given to family.  Family pastor at bedside.

## 2015-12-28 NOTE — ED Notes (Addendum)
Report from Rolene Arbour, RN.  Bedside introduction to family.  Emotional support given.  Plan to wait for son to arrive prior to extubation.  Per Dr. Barbee Cough- administer Morphine and Ativan just prior to extubation.

## 2015-12-28 NOTE — ED Notes (Signed)
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 Pt has signed DNR from 12/11/15, however, mother insisted EMS "do everything possible" upon arrival to home. EMS called b/c pt had decreased food intake, only taking oxy and xanax for last 2 days from hospice. Called EMS b/c family called hospice but family "did not think hospice would come quickly enough." Initial assessment by EMS - BP 40/20, unresponsive w/ no gag reflex, pupils 2 and unreactive. Pt intubated by EMS. Possible aspiration of meds or food. Given 654ml NS. Last BP w/ EMS 65/30.

## 2015-12-28 NOTE — ED Notes (Addendum)
Time of Death 1607 per Dr. Billy Fischer
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
## 2015-12-28 NOTE — ED Notes (Signed)
Eye care completed by Charlett Nose, EMT and pt transported to morgue.

## 2015-12-28 NOTE — ED Notes (Signed)
Family remains at bedside.  Support given.

## 2015-12-28 NOTE — Progress Notes (Signed)
Patient extubated per MD order per family request for "withdrawal of care".  No complications were noted and patient tolerated well.

## 2015-12-28 NOTE — ED Notes (Signed)
Pt's family at bedside-- waiting for rest of family to come to ED, pt remains on vent, Dr. Billy Fischer speaking with family.

## 2015-12-28 NOTE — ED Notes (Signed)
Continued support given to family.

## 2015-12-28 NOTE — ED Notes (Signed)
Dr. Billy Fischer spoke with hospice nurse on phone, family in consult room with chaplain -- Dr. Billy Fischer talking with family.

## 2015-12-28 DEATH — deceased

## 2016-12-27 IMAGING — CR DG CHEST 2V
2 series · 2 of 2 positions shown · non-contrast
Comparison: CT chest 10/30/2015

CLINICAL DATA: Pancreatic cancer.  Chest pain.

EXAM:
CHEST  2 VIEW

[w chest lat]
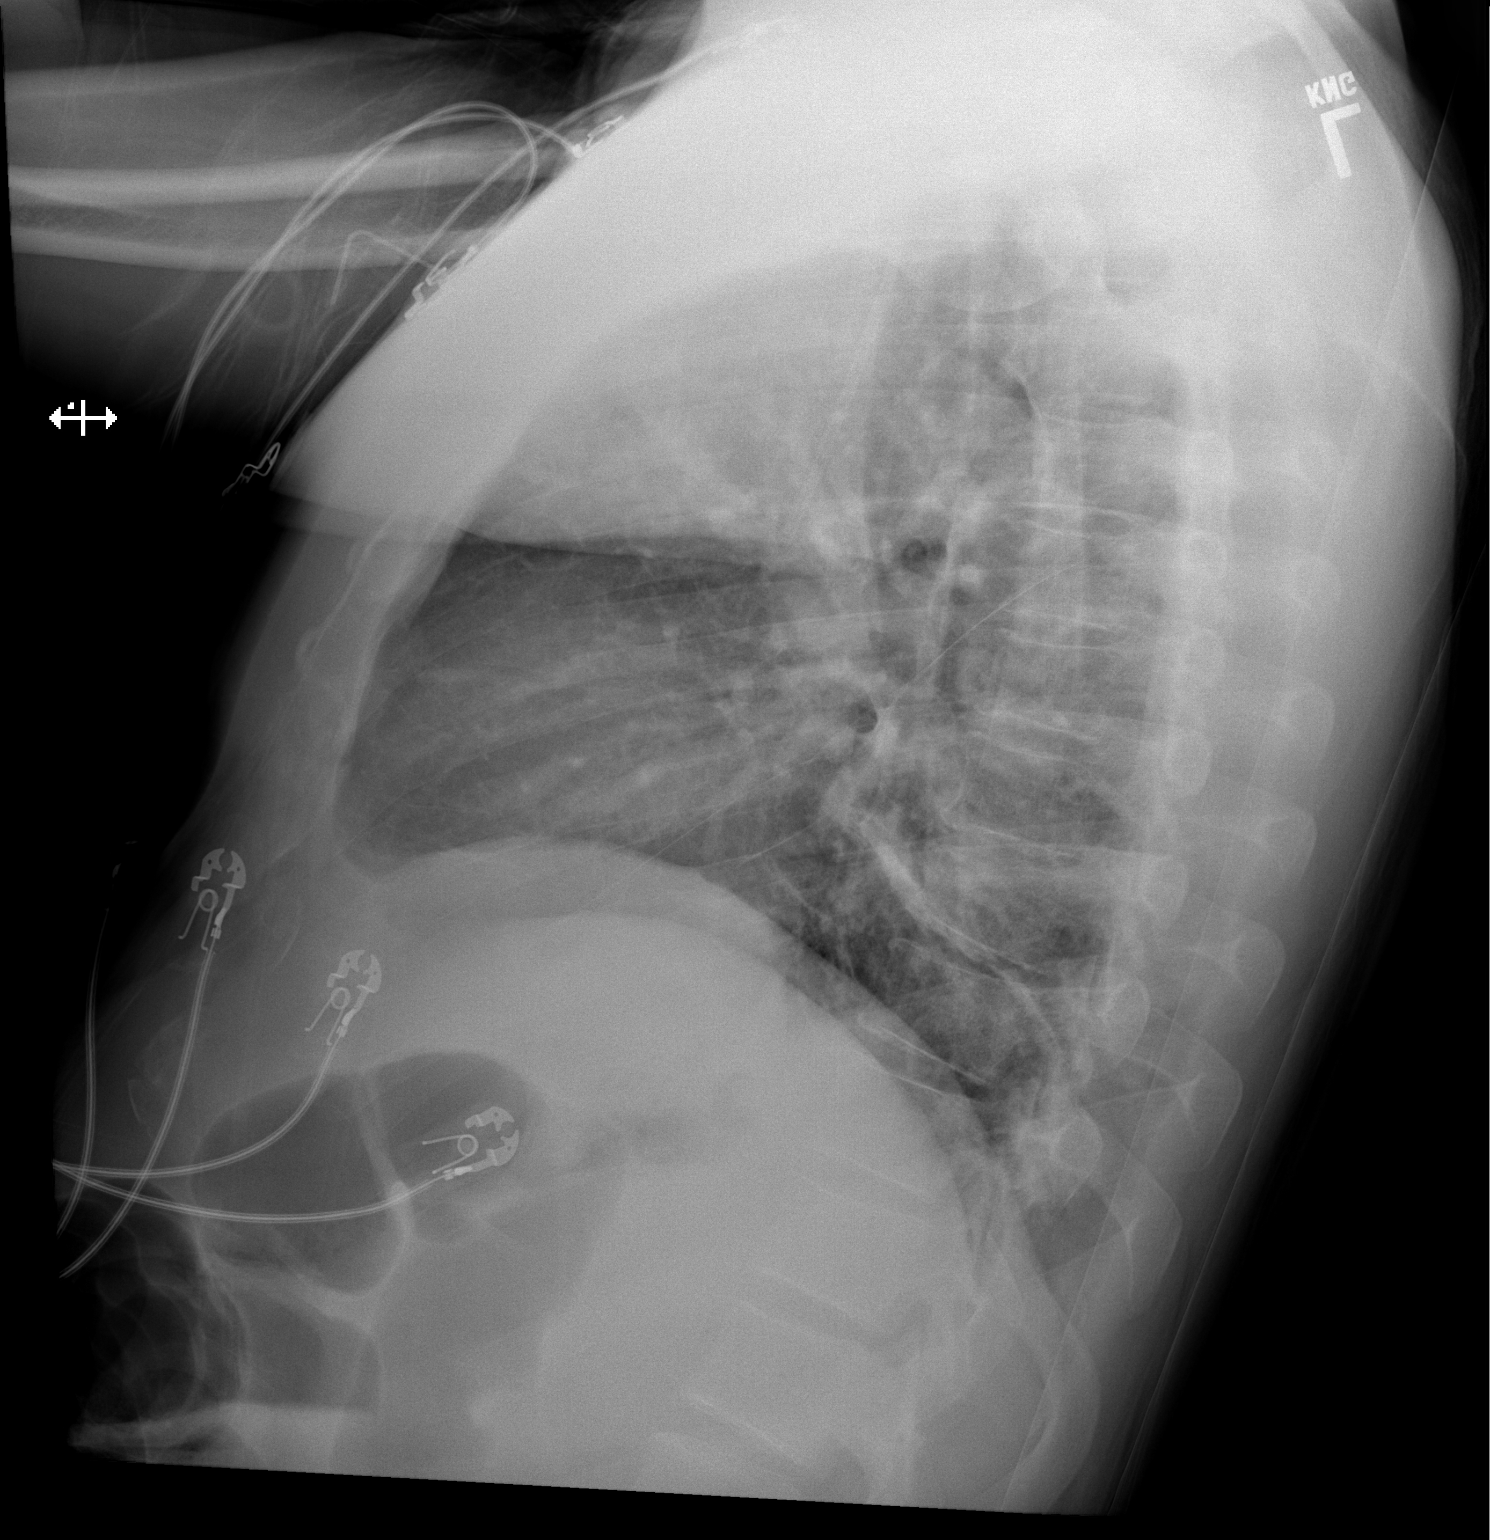

[x chest ap]
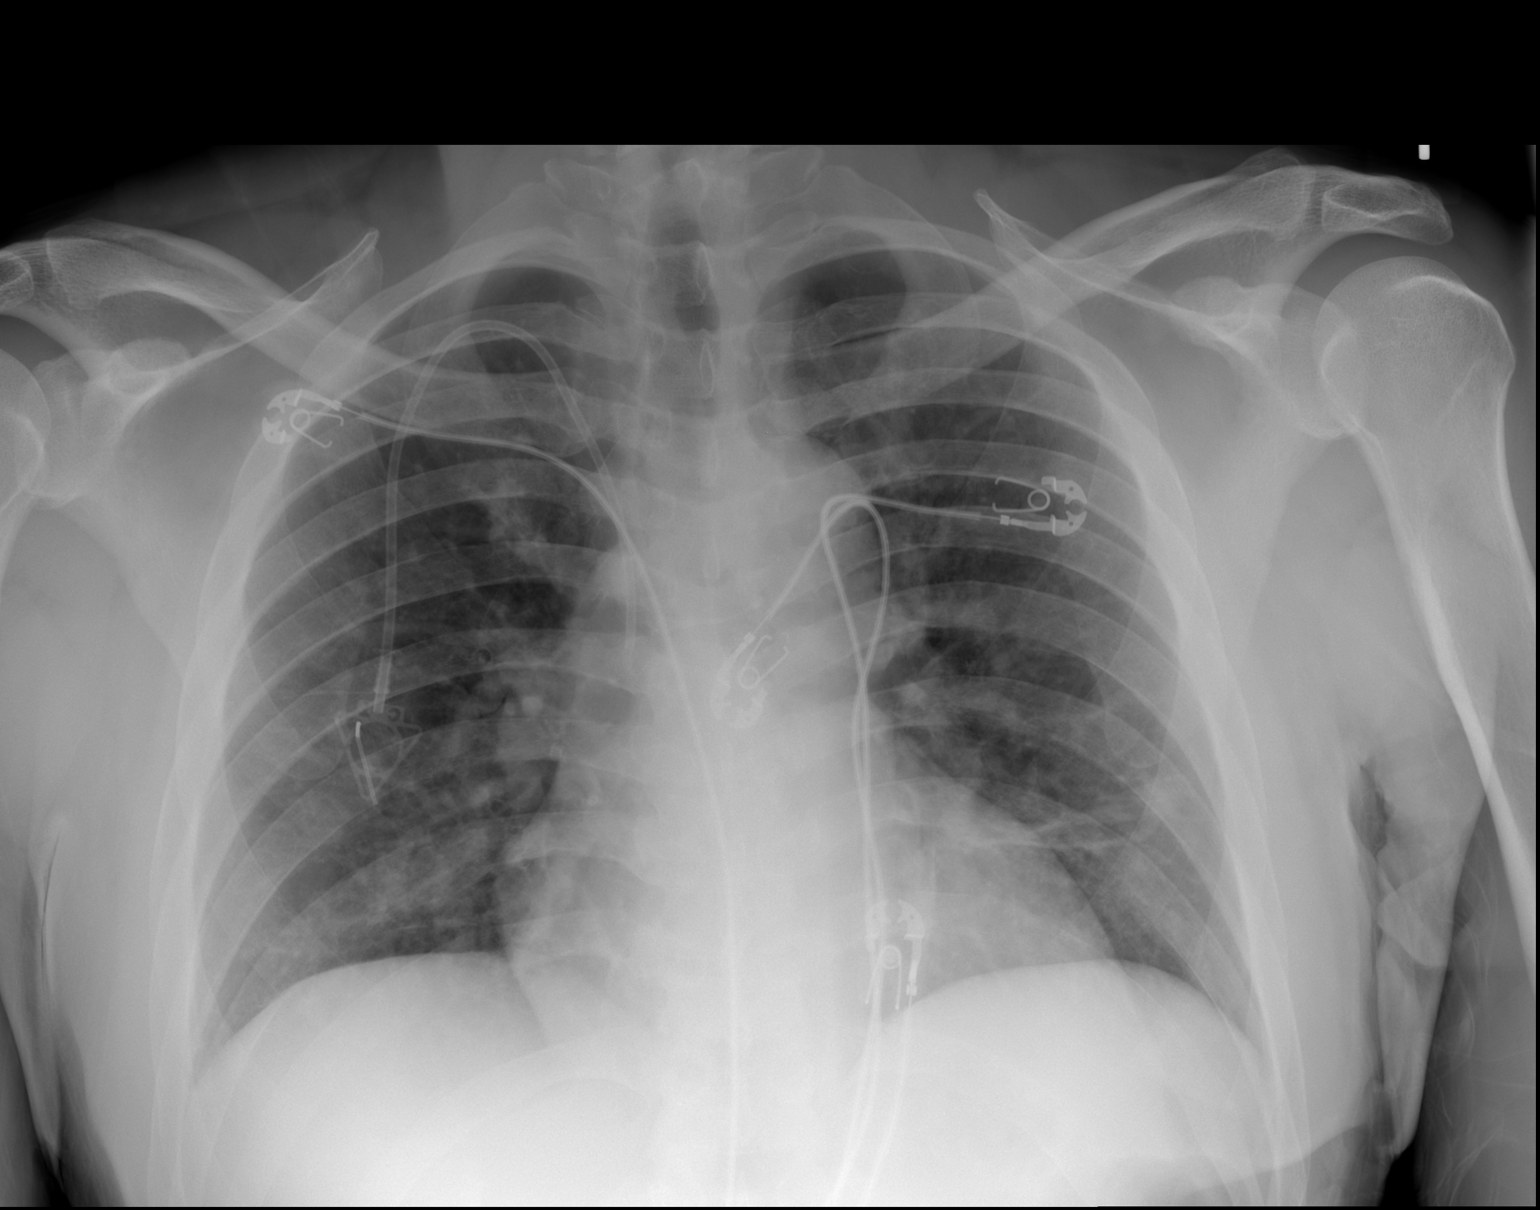

[2 of 2 positions shown; findings below may reference images not displayed]

FINDINGS: There is a right-sided Port-A-Cath in satisfactory position. There
is mild lingular atelectasis. The lungs are otherwise clear. There
is no pleural effusion or pneumothorax. The heart and mediastinal
contours are unremarkable.

The osseous structures are unremarkable.
IMPRESSION: No active cardiopulmonary disease.
